# Patient Record
Sex: Female | Born: 1960 | Race: White | Hispanic: No | Marital: Single | Smoking: Former smoker
Health system: Southern US, Community
[De-identification: ages and names within clinical notes are randomized; demographics above are authoritative.]

## PROBLEM LIST (undated history)

## (undated) DIAGNOSIS — M797 Fibromyalgia: Secondary | ICD-10-CM

## (undated) DIAGNOSIS — F319 Bipolar disorder, unspecified: Secondary | ICD-10-CM

## (undated) DIAGNOSIS — Z950 Presence of cardiac pacemaker: Secondary | ICD-10-CM

## (undated) DIAGNOSIS — R945 Abnormal results of liver function studies: Secondary | ICD-10-CM

## (undated) DIAGNOSIS — R238 Other skin changes: Secondary | ICD-10-CM

## (undated) DIAGNOSIS — C7951 Secondary malignant neoplasm of bone: Secondary | ICD-10-CM

## (undated) DIAGNOSIS — G8929 Other chronic pain: Secondary | ICD-10-CM

## (undated) DIAGNOSIS — K219 Gastro-esophageal reflux disease without esophagitis: Secondary | ICD-10-CM

## (undated) DIAGNOSIS — C801 Malignant (primary) neoplasm, unspecified: Secondary | ICD-10-CM

## (undated) DIAGNOSIS — Z6841 Body Mass Index (BMI) 40.0 and over, adult: Secondary | ICD-10-CM

## (undated) DIAGNOSIS — R509 Fever, unspecified: Secondary | ICD-10-CM

## (undated) DIAGNOSIS — I10 Essential (primary) hypertension: Secondary | ICD-10-CM

## (undated) DIAGNOSIS — D649 Anemia, unspecified: Secondary | ICD-10-CM

## (undated) DIAGNOSIS — R519 Headache, unspecified: Secondary | ICD-10-CM

## (undated) DIAGNOSIS — R7989 Other specified abnormal findings of blood chemistry: Secondary | ICD-10-CM

## (undated) DIAGNOSIS — R6883 Chills (without fever): Secondary | ICD-10-CM

## (undated) DIAGNOSIS — R233 Spontaneous ecchymoses: Secondary | ICD-10-CM

## (undated) DIAGNOSIS — R531 Weakness: Secondary | ICD-10-CM

## (undated) DIAGNOSIS — M1990 Unspecified osteoarthritis, unspecified site: Secondary | ICD-10-CM

## (undated) DIAGNOSIS — C50919 Malignant neoplasm of unspecified site of unspecified female breast: Secondary | ICD-10-CM

## (undated) DIAGNOSIS — G4730 Sleep apnea, unspecified: Secondary | ICD-10-CM

## (undated) DIAGNOSIS — Z923 Personal history of irradiation: Secondary | ICD-10-CM

## (undated) DIAGNOSIS — R51 Headache: Secondary | ICD-10-CM

## (undated) DIAGNOSIS — I441 Atrioventricular block, second degree: Secondary | ICD-10-CM

## (undated) HISTORY — DX: Malignant neoplasm of unspecified site of unspecified female breast: C50.919

## (undated) HISTORY — DX: Weakness: R53.1

## (undated) HISTORY — DX: Fibromyalgia: M79.7

## (undated) HISTORY — PX: CHOLECYSTECTOMY: SHX55

## (undated) HISTORY — DX: Headache: R51

## (undated) HISTORY — PX: OTHER SURGICAL HISTORY: SHX169

## (undated) HISTORY — DX: Chills (without fever): R68.83

## (undated) HISTORY — DX: Other specified abnormal findings of blood chemistry: R79.89

## (undated) HISTORY — PX: ECTOPIC PREGNANCY SURGERY: SHX613

## (undated) HISTORY — DX: Bipolar disorder, unspecified: F31.9

## (undated) HISTORY — DX: Malignant (primary) neoplasm, unspecified: C80.1

## (undated) HISTORY — DX: Headache, unspecified: R51.9

## (undated) HISTORY — DX: Abnormal results of liver function studies: R94.5

## (undated) HISTORY — DX: Unspecified osteoarthritis, unspecified site: M19.90

## (undated) HISTORY — DX: Atrioventricular block, second degree: I44.1

## (undated) HISTORY — DX: Morbid (severe) obesity due to excess calories: E66.01

## (undated) HISTORY — DX: Body Mass Index (BMI) 40.0 and over, adult: Z684

## (undated) HISTORY — PX: BILATERAL KNEE ARTHROSCOPY: SUR91

## (undated) HISTORY — DX: Secondary malignant neoplasm of bone: C79.51

## (undated) HISTORY — DX: Fever, unspecified: R50.9

## (undated) HISTORY — DX: Other skin changes: R23.8

## (undated) HISTORY — DX: Other chronic pain: G89.29

## (undated) HISTORY — DX: Anemia, unspecified: D64.9

## (undated) HISTORY — DX: Personal history of irradiation: Z92.3

## (undated) HISTORY — DX: Spontaneous ecchymoses: R23.3

## (undated) HISTORY — PX: PORTACATH PLACEMENT: SHX2246

---

## 2001-10-07 ENCOUNTER — Encounter (HOSPITAL_COMMUNITY): Admission: RE | Admit: 2001-10-07 | Discharge: 2001-11-06 | Payer: Self-pay | Admitting: Rheumatology

## 2009-02-10 HISTORY — PX: PACEMAKER INSERTION: SHX728

## 2009-07-24 ENCOUNTER — Ambulatory Visit: Payer: Self-pay | Admitting: Cardiology

## 2009-10-25 ENCOUNTER — Encounter: Payer: Self-pay | Admitting: Physician Assistant

## 2009-10-26 ENCOUNTER — Ambulatory Visit: Payer: Self-pay | Admitting: Cardiology

## 2009-10-26 ENCOUNTER — Encounter: Payer: Self-pay | Admitting: Physician Assistant

## 2009-10-27 ENCOUNTER — Encounter: Payer: Self-pay | Admitting: Physician Assistant

## 2009-11-02 ENCOUNTER — Ambulatory Visit: Payer: Self-pay | Admitting: Cardiology

## 2009-11-02 ENCOUNTER — Encounter (INDEPENDENT_AMBULATORY_CARE_PROVIDER_SITE_OTHER): Payer: Self-pay | Admitting: *Deleted

## 2009-11-02 DIAGNOSIS — R079 Chest pain, unspecified: Secondary | ICD-10-CM | POA: Insufficient documentation

## 2009-11-02 DIAGNOSIS — R55 Syncope and collapse: Secondary | ICD-10-CM | POA: Insufficient documentation

## 2009-11-04 ENCOUNTER — Encounter: Payer: Self-pay | Admitting: Physician Assistant

## 2009-11-08 ENCOUNTER — Encounter: Payer: Self-pay | Admitting: Physician Assistant

## 2009-11-09 ENCOUNTER — Encounter: Payer: Self-pay | Admitting: Internal Medicine

## 2009-11-09 ENCOUNTER — Encounter (INDEPENDENT_AMBULATORY_CARE_PROVIDER_SITE_OTHER): Payer: Self-pay | Admitting: *Deleted

## 2009-11-09 ENCOUNTER — Ambulatory Visit: Payer: Self-pay | Admitting: Physician Assistant

## 2009-11-09 DIAGNOSIS — I441 Atrioventricular block, second degree: Secondary | ICD-10-CM | POA: Insufficient documentation

## 2009-11-13 ENCOUNTER — Ambulatory Visit: Payer: Self-pay | Admitting: Internal Medicine

## 2009-11-13 ENCOUNTER — Ambulatory Visit (HOSPITAL_COMMUNITY): Admission: RE | Admit: 2009-11-13 | Discharge: 2009-11-14 | Payer: Self-pay | Admitting: Internal Medicine

## 2009-11-14 DIAGNOSIS — I441 Atrioventricular block, second degree: Secondary | ICD-10-CM

## 2009-11-14 HISTORY — PX: OTHER SURGICAL HISTORY: SHX169

## 2009-11-14 HISTORY — DX: Atrioventricular block, second degree: I44.1

## 2009-11-19 ENCOUNTER — Encounter: Payer: Self-pay | Admitting: Internal Medicine

## 2009-11-26 ENCOUNTER — Ambulatory Visit: Payer: Self-pay

## 2010-03-04 ENCOUNTER — Encounter: Payer: Self-pay | Admitting: Internal Medicine

## 2010-03-04 ENCOUNTER — Ambulatory Visit
Admission: RE | Admit: 2010-03-04 | Discharge: 2010-03-04 | Payer: Self-pay | Source: Home / Self Care | Attending: Internal Medicine | Admitting: Internal Medicine

## 2010-03-12 NOTE — Assessment & Plan Note (Signed)
Summary: [facility]-POST [facility]   Visit Type:  Follow-up Primary Provider:  Farrell Ours   History of Present Illness: patient presents for initial post hospital followup.   She was initially referred to [place] this past June, here at [facility], for evaluation of syncope and marginally elevated troponins. She was felt to have vasovagal syncope, had a normal baseline 2D echocardiogram, and was referred for a dobutamine stress echocardiogram, which was normal.  She was then readmitted a few weeks ago, again with syncope (her second episode), and kept for overnight observation. We were not formally consulted, this time. An echocardiogram was repeated, and again normal. She was referred for a 48 hour Holter monitor, with results made available to [place] just yesterday.  Monitor results notable for several episodes of marked bradycardia, with heart rate as low as 26 bpm, and 3 pauses of greater than 2.5 second duration, with the longest 4.5 seconds.  Clinically, she denied any frank syncope during this timeframe; however, she continues to have intermittent dizziness.  Preventive Screening-Counseling & Management  Alcohol-Tobacco     Smoking Status: quit     Year Quit: 1997  Current Medications (verified): 1)  Abilify 20 Mg Tabs (Aripiprazole) .... Take 1 Tablet By Mouth Once A Day 2)  Meloxicam 7.5 Mg Tabs (Meloxicam) .... Take 1 Tablet By Mouth Once A Day 3)  Ambien 10 Mg Tabs (Zolpidem Tartrate) .... Take 1 Tablet By Mouth Once A Day As Needed 4)  Lortab 5-500 Mg Tabs (Hydrocodone-Acetaminophen) .... Take 1 Tablet By Mouth Three Times A Day 5)  Omeprazole 20 Mg Cpdr (Omeprazole) .... Take 1 Tablet By Mouth Once A Day 6)  Cymbalta 60 Mg Cpep (Duloxetine Hcl) .... Take 1 Tablet By Mouth Once A Day 7)  Flexeril 5 Mg Tabs (Cyclobenzaprine Hcl) .... Take 1 Tablet By Mouth Once A Day 8)  Klonopin 1 Mg Tabs (Clonazepam) .... Take 1 Tablet By Mouth Once A Day  Allergies: 1)  ! Pcn 2)  !  Sulfa  Comments:  Nurse/Medical Assistant: The patient's medications and allergies were verbally reviewed with the patient and were updated in the Medication and Allergy Lists.  Past History:  Past Medical History: Bipolar disorder Fibromyalgia Anemia History of elevated LFTs  Review of Systems       No fevers, chills, hemoptysis, dysphagia, melena, hematocheezia, hematuria, rash, claudication, or orthopnea. Patient has mild, chronic pedal edema. All other systems negative.   Vital Signs:  Patient profile:   50 year old female Height:      64 inches Weight:      241 pounds BMI:     41.52 Pulse rate:   88 / minute BP sitting:   113 / 80  (left arm) Cuff size:   large  Vitals Entered By: Carlye Grippe (November 09, 2009 12:52 PM)  Nutrition Counseling: Patient's BMI is greater than 25 and therefore counseled on weight management options.  Physical Exam  Additional Exam:  GEN: 50 year old female, sitting upright, no distress HEENT: NCAT,PERRLA,EOMI NECK: palpable pulses, no bruits; no JVD; no TM LUNGS: CTA bilaterally HEART: RRR (S1S2); no significant murmurs; no rubs; no gallops ABD: soft, NT; intact BS EXT: intact distal pulses; 1+ pedal edema SKIN: warm, dry MUSC: no obvious deformity NEURO: A/O (x3)     EKG  Procedure date:  10/25/2009  Findings:      normal sinus rhythm with PACs at 96 bpm; nonspecific ST changes  Impression & Recommendations:  Problem # 1:  BRADYCARDIA (ICD-427.89)  recent 48-hour Holter monitor reveals episodes of marked bradycardia and ventricular asystole, with rates as low as 26 BPM, and pauses up to 4.5 seconds in duration. the rhythm appears to be high-grade atrioventricular block, with no definite evidence of complete heart block. the patient did not experience any recurrent syncope during this timeframe. however, she continues to have intermittent dizziness. will refer patient to our EP team for consideration of possible  permanent pacemaker implantation. in the meanwhile, patient is advised to refrain from driving. Of note, she is not on any SA/AV nodal blocking agents.  Problem # 2:  SYNCOPE (ICD-780.2) Assessment: Comment Only  patient has NL LVF by recent echocardiography.  Problem # 3:  CHEST PAIN (ICD-786.50)  patient had recent normal dobutamine stress echocardiogram. No further workup is indicated.  Patient Instructions: 1)  Your physician recommends that you go to the [facility] for lab work: DO TODAY. 2)  Your physician has recommended that you have a pacemaker inserted.  A pacemaker is a small device that is placed under the skin of your chest or abdomen to help control abnormal heart rhythms. This device uses electrical pulses to prompt the heart to beat at a normal rate. Pacemakers are used to treat heart rhythms that are too slow. Wires (leads) are attached to the pacemaker that goes into the chambers of your heart. This is done in the hospital and usually requires an overnight stay. Please see the instruction sheet given to you today for more information.  Appended Document: [facility] I spoke briefly with patient and reviewed her event monitor which clearly documents mobitz II AV block with multiple consecutive P waves not conducted.  She is at risk for progressive AV block and syncope.  I have recommended pacemaker implantation and the patient wishes to proceed.  We will schedule pacemaker implantation at the next available time.  Given her h/o requiring MRIs, I have suggested a REVO (Medtronic) MRI compatable pacemaker.

## 2010-03-12 NOTE — Procedures (Signed)
Summary: wound check.mdt.amber   Current Medications (verified): 1)  Abilify 20 Mg Tabs (Aripiprazole) .... Take 1 Tablet By Mouth Once A Day 2)  Meloxicam 7.5 Mg Tabs (Meloxicam) .... Take 1 Tablet By Mouth Once A Day 3)  Ambien 10 Mg Tabs (Zolpidem Tartrate) .... Take 1 Tablet By Mouth Once A Day As Needed 4)  Lortab 5-500 Mg Tabs (Hydrocodone-Acetaminophen) .... Take 1 Tablet By Mouth Three Times A Day 5)  Omeprazole 20 Mg Cpdr (Omeprazole) .... Take 1 Tablet By Mouth Once A Day 6)  Cymbalta 60 Mg Cpep (Duloxetine Hcl) .... Take 1 Tablet By Mouth Once A Day 7)  Flexeril 5 Mg Tabs (Cyclobenzaprine Hcl) .... Take 1 Tablet By Mouth Once A Day 8)  Klonopin 1 Mg Tabs (Clonazepam) .... Take 1 Tablet By Mouth Once A Day  Allergies (verified): 1)  ! Pcn 2)  ! Sulfa   PPM Specifications Following MD:  Hillis Range, MD     PPM Vendor:  Medtronic     PPM Model Number:  [id number]     PPM Serial Number:  [id number] PPM DOI:  11/14/2009     PPM Implanting MD:  Hillis Range, MD  Lead 1    Location: RA     DOI: 11/14/2009     Model #: [id number]     Serial #: [id number]     Status: active Lead 2    Location: RV     DOI: 11/14/2009     Model #: [id number]     Serial #: [id number]      Magnet Response Rate:  BOL 85 ERI 65  Indications:  Mobitz II   PPM Follow Up Battery Voltage:  3.04 V       PPM Device Measurements Atrium  Amplitude: 1.0 mV, Impedance: 504 ohms, Threshold: 0.5 V at 0.4 msec Right Ventricle  Amplitude: 4.6 mV, Impedance: 496 ohms, Threshold: 1.0 V at 0.4 msec  Episodes MS Episodes:  0     Ventricular High Rate:  0     Atrial Pacing:  0.2%     Ventricular Pacing:  0.9%  Parameters Mode:  DDD     Lower Rate Limit:  60     Upper Rate Limit:  130 Paced AV Delay:  300     Sensed AV Delay:  300 Next Cardiology Appt Due:  03/04/2010 Tech Comments:  WOUND CHECK--STERI STRIPS REMOVED.  NO REDNESS OR SWELLING AT SITE.  NORMAL DEVICE FUNCTION.  NO CHANGES MADE.  PT HAVING SOME SOB BUT HAD BEFORE  IMPLANT AS WELL.  ROV 03-04-09 W/JA. Vella Kohler  November 26, 2009 11:43 AM

## 2010-03-12 NOTE — Letter (Signed)
Summary: [facility] D/C DR. Letta Median  [facility] D/C DR. Letta Median   Imported By: Zachary George 10/30/2009 11:01:34  _____________________________________________________________________  External Attachment:    Type:   Image     Comment:   External Document

## 2010-03-12 NOTE — Assessment & Plan Note (Signed)
Summary: 48 hour holter monitor  Nurse Visit  CC: 48 holter Comments monitor place without difficulty and patient verbalized understanding of instructions.   Allergies: 1)  ! Pcn  Orders Added: 1)  Holter Monitor [Holter Monitor]

## 2010-03-12 NOTE — Miscellaneous (Signed)
Summary: Device preload  Clinical Lists Changes  Observations: Added new observation of PPM INDICATN: Mobitz II (11/19/2009 15:27) Added new observation of MAGNET RTE: BOL 85 ERI 65 (11/19/2009 15:27) Added new observation of PPMLEADSER2: [id number] (11/19/2009 15:27) Added new observation of PPMLEADMOD2: 5086  (11/19/2009 15:27) Added new observation of PPMLEADLOC2: RV  (11/19/2009 15:27) Added new observation of PPMLEADSTAT1: active  (11/19/2009 15:27) Added new observation of PPMLEADSER1: [id number]  (11/19/2009 15:27) Added new observation of PPMLEADMOD1: 5086  (11/19/2009 15:27) Added new observation of PPMLEADLOC1: RA  (11/19/2009 15:27) Added new observation of PPM IMP MD: Hillis Range, MD  (11/19/2009 15:27) Added new observation of PPMLEADDOI2: 11/14/2009  (11/19/2009 15:27) Added new observation of PPMLEADDOI1: 11/14/2009  (11/19/2009 15:27) Added new observation of PPM DOI: 11/14/2009  (11/19/2009 15:27) Added new observation of PPM SERL#: [id number]  (11/19/2009 15:27) Added new observation of PPM MODL#: [id number]  (11/19/2009 40:34) Added new observation of PACEMAKERMFG: Medtronic  (11/19/2009 15:27) Added new observation of PACEMAKER MD: Hillis Range, MD  (11/19/2009 15:27)      PPM Specifications Following MD:  Hillis Range, MD     PPM Vendor:  Medtronic     PPM Model Number:  [id number]     PPM Serial Number:  [id number] PPM DOI:  11/14/2009     PPM Implanting MD:  Hillis Range, MD  Lead 1    Location: RA     DOI: 11/14/2009     Model #: [id number]     Serial #: [id number]     Status: active Lead 2    Location: RV     DOI: 11/14/2009     Model #: [id number]     Serial #: [id number]      Magnet Response Rate:  BOL 85 ERI 65  Indications:  Mobitz II

## 2010-03-12 NOTE — Procedures (Signed)
Summary: Holter Full Report  Holter Full Report   Imported By: Cyril Loosen, RN, BSN 11/09/2009 14:59:53  _____________________________________________________________________  External Attachment:    Type:   Image     Comment:   External Document

## 2010-03-12 NOTE — Letter (Signed)
Summary: Implantable Device Instructions  Architectural technologist at [facility] [address]   [place], [place] [address]   Phone: [phone]  Fax: [phone]      Implantable Device Instructions  You are scheduled for:  ___X__ Permanent Transvenous Pacemaker _____ Implantable Cardioverter Defibrillator _____ Implantable Loop Recorder _____ Generator Change  on __Tuesday, October 4, 2011___ with Dr. __Allred___.  1.  Please arrive at the Short Stay Center at [facility] at __10 am___ on the day of your procedure.  2.  Do not eat or drink the night before your procedure.  3.  Complete lab work on _____.  The lab at [facility] is open from 8:30 AM to 1:30 PM and from 2:30 PM to 5:00 PM.  The lab at [facility] is open from 7:30 AM to 5:30 PM.  You do not have to be fasting.  4.  Do NOT take these medications for ____ days prior to your procedure:  _________________________.  Take your last dose of Coumadin on ________.  5.  Plan for an overnight stay.  Bring your insurance cards and a list of your medications.  6.  Wash your chest and neck with antibacterial soap (any brand) the evening before and the morning of your procedure.  Rinse well.  7.  Education material received:     Pacemaker _____           ICD _____           Arrhythmia _____  *If you have ANY questions after you get home, please call the office [phone].  *Every attempt is made to prevent procedures from being rescheduled.  Due to the nauture of Electrophysiology, rescheduling can happen.  The physician is always aware and directs the staff when this occurs.

## 2010-03-12 NOTE — Cardiovascular Report (Signed)
Summary: Pre Op Orders   Pre Op Orders   Imported By: Roderic Ovens 11/21/2009 16:02:02  _____________________________________________________________________  External Attachment:    Type:   Image     Comment:   External Document

## 2010-03-14 NOTE — Assessment & Plan Note (Signed)
Summary: pacer check.mdt.amber   Visit Type:  Pacemaker check Primary Provider:  Farrell Ours   History of Present Illness: The patient presents today for routine electrophysiology followup. She reports doing very well since having her pacemaker implanted 11/14/09.  She has had no further syncope since that time.  She reports occaisonal pain in her L shoulder but feels that this is improving.  The patient denies symptoms of palpitations, chest pain, shortness of breath, orthopnea, PND, lower extremity edema, dizziness, presyncope, syncope, or neurologic sequela. The patient is tolerating medications without difficulties and is otherwise without complaint today.   Preventive Screening-Counseling & Management  Alcohol-Tobacco     Smoking Status: quit     Year Quit: 1987  Current Medications (verified): 1)  Abilify 20 Mg Tabs (Aripiprazole) .... Take 1 Tablet By Mouth Once A Day 2)  Meloxicam 7.5 Mg Tabs (Meloxicam) .... Take 1 Tablet By Mouth Once A Day 3)  Ambien 10 Mg Tabs (Zolpidem Tartrate) .... Take 1 Tablet By Mouth Once A Day As Needed 4)  Lortab 5-500 Mg Tabs (Hydrocodone-Acetaminophen) .... Take 1 Tablet By Mouth Three Times A Day 5)  Omeprazole 20 Mg Cpdr (Omeprazole) .... Take 1 Tablet By Mouth Once A Day 6)  Cymbalta 60 Mg Cpep (Duloxetine Hcl) .... Take 1 Tablet By Mouth Once A Day 7)  Flexeril 5 Mg Tabs (Cyclobenzaprine Hcl) .... Take 1 Tablet By Mouth Once A Day 8)  Klonopin 1 Mg Tabs (Clonazepam) .... Take 1 Tablet By Mouth Once A Day  Allergies (verified): 1)  ! Pcn 2)  ! Sulfa  Comments:  Nurse/Medical Assistant: The patient's medications and allergies were reviewed with the patient and were updated in the Medication and Allergy Lists. Tammi Romine CMA (March 04, 2010 3:38 PM)  Past History:  Past Medical History: Bipolar disorder Fibromyalgia Anemia History of elevated LFTs Mobitz II AV block s/p MDT Revo PPM 11/14/09 by Fawn Kirk  Past Surgical  History: Cholecystectomy 2 Ectopic pregnancies Bilateral knee arthroscopy Gastric Bypass surgery Plastic surgery on face for ptosis of rt. eyelid Left ovary and fallopian tube removed due to chronic pain PPM 11/14/09  Vital Signs:  Patient profile:   50 year old female Height:      64 inches Weight:      259 pounds Pulse rate:   83 / minute BP sitting:   122 / 83  (left arm) Cuff size:   large  Vitals Entered By: Fuller Plan CMA (March 04, 2010 3:36 PM)  Physical Exam  General:  overweight Head:  normocephalic and atraumatic Eyes:  PERRLA/EOM intact; conjunctiva and lids normal. Mouth:  Teeth, gums and palate normal. Oral mucosa normal. Neck:  supple Chest Wall:  pacemaker pocket is well healed Lungs:  Clear bilaterally to auscultation and percussion. Heart:  Non-displaced PMI, chest non-tender; regular rate and rhythm, S1, S2 without murmurs, rubs or gallops. Carotid upstroke normal, no bruit. Normal abdominal aortic size, no bruits. Femorals normal pulses, no bruits. Pedals normal pulses. No edema, no varicosities. Abdomen:  Bowel sounds positive; abdomen soft and non-tender without masses, organomegaly, or hernias noted. No hepatosplenomegaly. Msk:  Back normal, normal gait. Muscle strength and tone normal. Extremities:  No clubbing or cyanosis. Neurologic:  Alert and oriented x 3.   PPM Specifications Following MD:  Hillis Range, MD     PPM Vendor:  Medtronic     PPM Model Number:  [facility]     PPM Serial Number:  [id number] PPM DOI:  11/14/2009  PPM Implanting MD:  Hillis Range, MD  Lead 1    Location: RA     DOI: 11/14/2009     Model #: 1610     Serial #: [id number]     Status: active Lead 2    Location: RV     DOI: 11/14/2009     Model #: 1191     Serial #: [id number]      Magnet Response Rate:  BOL 85 ERI 65  Indications:  Mobitz II   PPM Follow Up Battery Voltage:  3.03 V       PPM Device Measurements Atrium  Amplitude: 1.6 mV, Impedance: 456 ohms,  Threshold: 0.5 V at 0.4 msec Right Ventricle  Amplitude: 4.9 mV, Impedance: 544 ohms, Threshold: 1.0 V at 0.4 msec  Episodes MS Episodes:  0     Ventricular High Rate:  0     Atrial Pacing:  <0.2%     Ventricular Pacing:  <0.2%  Parameters Mode:  DDD     Lower Rate Limit:  60     Upper Rate Limit:  130 Paced AV Delay:  300     Sensed AV Delay:  300 Next Cardiology Appt Due:  08/12/2010 Tech Comments:  1 FAST A&V EPISODE.  NORMAL DEVICE FUNCTION. NO CHANGES MADE. ROV IN 6 MTHS W/DEVICE CLINIC IN [place]. Vella Kohler  March 04, 2010 3:47 PM MD Comments:  agree above fast A&V appears to be sinus tach  Impression & Recommendations:  Problem # 1:  SYNCOPE (ICD-780.2) due to Mobitz II second degree AV block resolved s/p PPM doing well as above

## 2010-03-20 NOTE — Cardiovascular Report (Signed)
Summary: Card Device Clinic/ INTERROGATION QUICK LOOK  Card Device Clinic/ INTERROGATION QUICK LOOK   Imported By: Dorise Hiss 03/14/2010 16:57:57  _____________________________________________________________________  External Attachment:    Type:   Image     Comment:   External Document

## 2010-04-25 LAB — SURGICAL PCR SCREEN: Staphylococcus aureus: NEGATIVE

## 2010-06-28 NOTE — Consult Note (Signed)
Ashlee Moore, Ashlee Moore NO.:  [id number]   MEDICAL RECORD NO.:  [id number]                   PATIENT TYPE:   LOCATION:                                       FACILITY:   PHYSICIAN:  Aundra Dubin, M.D.            DATE OF BIRTH:   DATE OF CONSULTATION:  DATE OF DISCHARGE:                                   CONSULTATION   CHIEF COMPLAINT:  Left knee, left foot.   REASON FOR CONSULTATION:  This is the first office visit since last seeing  Ashlee Moore on May 07, 2000. At that time, I injected her right knee  because of pain. This did not particularly help or sustain. Ashlee Moore underwent an  MRI at [facility]. This showed the medial meniscus was somewhat thin  along with diffuse articular cartilage loss and fissuring involving all  three joint compartments. The plain x-rays also evaluated at that time  showed tri-compartment advanced OA changes. For unclear reasons, Ashlee Moore was not  evaluated by [name]. Her problem today is much more the left knee.  Several months ago, the knee began to pop. Ashlee Moore brings with her an x-ray  report from June 2003 showing significant degenerative arthritis. Ashlee Moore hurts  going up and down steps. Ashlee Moore is very stiff in the knee. Ashlee Moore reports falling  several times. Ashlee Moore is using a cane. Another problem is her left foot with a  burning type of pain medially and on the dorsal surface. There has been no  swelling to the left foot. Her leg has increased by 12 pounds. There has  been no recent urinary tract infection or shortness of breath Ashlee Moore recalls.  Her hands and wrists ache but not severely. Ashlee Moore continues to have low  energy. Sleep is not very good, even with Ambien.   MEDICATIONS:  1. Ambien 10 mg HS.  2. Zoloft 100 mg daily.  3. Mobic 7.5 mg bid.  4. Darvocet as needed.  5. Calcium bid.   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   PHYSICAL  EXAMINATION:  VITAL SIGNS: Weight 261 pounds. Blood pressure  130/80. Respiratory rate 20.  GENERAL: Ashlee Moore is significantly overweight.  LUNGS: Clear.  HEART: Regular rate and rhythm. No murmur, rub, or gallop.  SKIN: Clear.  MS: Hands are puffy but not arthritically swollen. Wrist, elbows, and  shoulders move with some stiffness. Trigger points of the elbows, shoulders,  neck, anterior chest, and along the paraspinous muscles was tender. The  right knee is cool but does have tenderness with flexion beyond 120 degrees.  The left knee had slight warmth. There was no effusion. Ashlee Moore had significant  tenderness with flexion beyond 120 degrees. Ashlee Moore also had significant medial  joint line tenderness. Ankles had trace edema. Ashlee Moore was tender to the medial  foot, along the navicular bone with  capping below the malleolus which may  have shown a positive Tinel sign. Her gait is antalgic and Ashlee Moore favors the  left extremity.    ASSESSMENT/PLAN:  1. Knee osteoarthritis. This is quite evident from the x-rays but there may     be further damage to possibly the meniscus. I offered to inject the knee     but Ashlee Moore says that these injections do not last very long. Ashlee Moore most likely     will need an MRI but I will leave this to an orthopedist evaluation. We     are setting up an evaluation with [name]. We will switch her     NSAID to Relafen 750 mg two pills daily. Ashlee Moore says that the Darvocet gives     her nausea.  2. Left foot pain. I have not x-rayed the foot. This may be a tarsal     syndrome. Ashlee Moore could also have degenerative changes to the mid foot.  3. History of insomnia and polyarthralgia. Ashlee Moore is on Ambien.   As we are having her evaluated by [name], I will be glad to work  with her in the future and see her on a as needed basis.                                               Aundra Dubin, M.D.    WWT/MEDQ  D:  10/07/2001  T:  10/08/2001  Job:  [id number]   cc:   Temple Pacini Plomaritis

## 2010-08-05 ENCOUNTER — Telehealth: Payer: Self-pay | Admitting: Internal Medicine

## 2010-08-05 NOTE — Telephone Encounter (Signed)
Spoke with pt and let her know that we would be glad to help but that a form would need to be filled out and her device would need to be checked before and after the MRI  Pt understands

## 2010-08-05 NOTE — Telephone Encounter (Signed)
Pt was to have an mri @ [facility] this am they can't do mri on a pt with a  pacemaker, which hospital  will do it? Wants to rs due to the mass in her leg

## 2010-08-06 ENCOUNTER — Other Ambulatory Visit (HOSPITAL_COMMUNITY): Payer: Self-pay | Admitting: Orthopedic Surgery

## 2010-08-06 DIAGNOSIS — M79659 Pain in unspecified thigh: Secondary | ICD-10-CM

## 2010-08-08 ENCOUNTER — Ambulatory Visit (HOSPITAL_COMMUNITY)
Admission: RE | Admit: 2010-08-08 | Discharge: 2010-08-08 | Disposition: A | Discharge status: Home / Self Care | Payer: Medicaid Other | Source: Ambulatory Visit | Attending: Orthopedic Surgery | Admitting: Orthopedic Surgery

## 2010-08-08 ENCOUNTER — Other Ambulatory Visit (HOSPITAL_COMMUNITY): Payer: Self-pay | Admitting: Orthopedic Surgery

## 2010-08-08 DIAGNOSIS — M79659 Pain in unspecified thigh: Secondary | ICD-10-CM

## 2010-08-08 DIAGNOSIS — R229 Localized swelling, mass and lump, unspecified: Secondary | ICD-10-CM | POA: Insufficient documentation

## 2010-08-16 ENCOUNTER — Encounter: Payer: Self-pay | Admitting: Internal Medicine

## 2010-09-08 ENCOUNTER — Other Ambulatory Visit: Payer: Self-pay | Admitting: Medical

## 2010-10-08 ENCOUNTER — Encounter: Payer: Self-pay | Admitting: *Deleted

## 2010-11-07 ENCOUNTER — Encounter: Payer: Self-pay | Admitting: Internal Medicine

## 2010-11-07 ENCOUNTER — Ambulatory Visit (INDEPENDENT_AMBULATORY_CARE_PROVIDER_SITE_OTHER): Payer: Medicaid Other | Admitting: Internal Medicine

## 2010-11-07 DIAGNOSIS — I441 Atrioventricular block, second degree: Secondary | ICD-10-CM

## 2010-11-07 DIAGNOSIS — I498 Other specified cardiac arrhythmias: Secondary | ICD-10-CM

## 2010-11-07 LAB — PACEMAKER DEVICE OBSERVATION
AL AMPLITUDE: 1.7741 mv
ATRIAL PACING PM: 0.05
BAMS-0001: 170 {beats}/min
BATTERY VOLTAGE: 3 V
RV LEAD THRESHOLD: 1 V
VENTRICULAR PACING PM: 0.08

## 2010-11-07 NOTE — Progress Notes (Signed)
The patient presents today for routine electrophysiology followup.  Since last being seen in our clinic, the patient reports doing very well.  She has had no further syncope.  She reports rare palpitations.  Today, she denies symptoms ofchest pain, shortness of breath, orthopnea, PND, lower extremity edema, dizziness, or neurologic sequela.  The patient feels that she is tolerating medications without difficulties and is otherwise without complaint today.   Past Medical History  Diagnosis Date  . Bipolar disorder   . Fibromyalgia   . Anemia   . Elevated LFTs     hx  . AV block, Mobitz 2 11/14/09    s/p MDT Revo PPM by JA   Past Surgical History  Procedure Date  . Cholecystectomy   . Ectopic pregnancy surgery     x2  . Bilateral knee arthroscopy   . Gastric bypass surgery   . Plastic surgery on face     for ptosis of rt. eyelid  . Left ovary and fallopian tube removed due to chronic pain   . Ppm 11/14/09    MDT Revo implanted by Dr Johney Frame for syncope/ Mobitz II AV block    Current Outpatient Prescriptions  Medication Sig Dispense Refill  . ARIPiprazole (ABILIFY) 20 MG tablet Take 20 mg by mouth daily.        . clonazePAM (KLONOPIN) 1 MG tablet Take 1 mg by mouth daily.        . cyclobenzaprine (FLEXERIL) 5 MG tablet Take 5 mg by mouth daily.        . DULoxetine (CYMBALTA) 60 MG capsule Take 60 mg by mouth daily.        Marland Kitchen HYDROcodone-acetaminophen (LORTAB 5) 5-500 MG per tablet Take 1 tablet by mouth 3 (three) times daily.        . meloxicam (MOBIC) 7.5 MG tablet Take 7.5 mg by mouth daily.        Marland Kitchen omeprazole (PRILOSEC) 20 MG capsule Take 20 mg by mouth daily.        Marland Kitchen zolpidem (AMBIEN) 10 MG tablet Take 10 mg by mouth daily as needed.          Allergies  Allergen Reactions  . Penicillins     REACTION: throat swelling  . Sulfonamide Derivatives     REACTION: rash    History   Social History  . Marital Status: Single    Spouse Name: N/A    Number of Children: N/A  .  Years of Education: N/A   Occupational History  . Not on file.   Social History Main Topics  . Smoking status: Former Smoker -- 0.3 packs/day for 8 years    Types: Cigarettes    Quit date: 02/10/1985  . Smokeless tobacco: Never Used   Comment: Quit 25 years back.   . Alcohol Use: No  . Drug Use: No  . Sexually Active: Not on file   Other Topics Concern  . Not on file   Social History Narrative   Lives with her children. Disabled.     Family History  Problem Relation Age of Onset  . Heart attack Father   . Breast cancer Sister   . Brain cancer Sister     Physical Exam: Filed Vitals:   11/07/10 1051  BP: 113/71  Pulse: 96  Height: 5\' 5"  (1.651 m)  Weight: 275 lb (124.739 kg)    GEN- The patient is obese appearing, alert and oriented x 3 today.   Head- normocephalic, atraumatic Eyes-  Sclera  clear, conjunctiva pink Ears- hearing intact Oropharynx- clear Neck- supple, no JVP Lymph- no cervical lymphadenopathy Lungs- Clear to ausculation bilaterally, normal work of breathing Chest- pacemaker pocket is well healed Heart- Regular rate and rhythm, no murmurs, rubs or gallops, PMI not laterally displaced GI- soft, NT, ND, + BS Extremities- no clubbing, cyanosis, trace edema  Pacemaker interrogation- reviewed in detail today,  See PACEART report  Assessment and Plan:

## 2010-11-07 NOTE — Assessment & Plan Note (Signed)
Normal pacemaker function See Arita Miss Art report No changes today  Syncope resolved,  She paces <1%

## 2011-01-11 HISTORY — PX: BREAST SURGERY: SHX581

## 2011-02-11 HISTORY — PX: OTHER SURGICAL HISTORY: SHX169

## 2011-02-18 ENCOUNTER — Telehealth: Payer: Self-pay | Admitting: Internal Medicine

## 2011-02-18 NOTE — Telephone Encounter (Signed)
To what area is she receiving radiation? Have Gunnar Fusi fill out the order sheet for radiation in patients with a device.

## 2011-02-18 NOTE — Telephone Encounter (Signed)
Follow up from previous.  Patient calling regarding chemotherapy.

## 2011-02-18 NOTE — Telephone Encounter (Signed)
New problem Pt said she will starting chemotherapy in couple of weeks. She wanted to talk to you

## 2011-02-18 NOTE — Telephone Encounter (Signed)
Patient to start Chemo(x 6 weeks)  on 02/27/11 with radiation to follow.   Pacemaker is on the left side  [facility] in [place]  Any contraindications?

## 2011-02-19 NOTE — Telephone Encounter (Signed)
She is having radiation to right breast and her device is on the left side

## 2011-02-19 NOTE — Telephone Encounter (Signed)
Gunnar Fusi,  Please fill out the radiation form for this patient.

## 2011-02-19 NOTE — Telephone Encounter (Signed)
We will wait for MD to send over radiation form, Dr. Johney Frame aware.

## 2011-03-10 DIAGNOSIS — I517 Cardiomegaly: Secondary | ICD-10-CM

## 2011-05-02 ENCOUNTER — Encounter: Payer: Self-pay | Admitting: Internal Medicine

## 2011-05-02 ENCOUNTER — Ambulatory Visit (INDEPENDENT_AMBULATORY_CARE_PROVIDER_SITE_OTHER): Payer: Medicaid Other | Admitting: *Deleted

## 2011-05-02 DIAGNOSIS — I441 Atrioventricular block, second degree: Secondary | ICD-10-CM

## 2011-05-02 LAB — PACEMAKER DEVICE OBSERVATION
AL AMPLITUDE: 1.7 mv
AL THRESHOLD: 1 V
BAMS-0001: 170 {beats}/min
BATTERY VOLTAGE: 3 V
RV LEAD AMPLITUDE: 6.4 mv
RV LEAD THRESHOLD: 1 V

## 2011-05-02 NOTE — Progress Notes (Signed)
Pacer check in clinic  

## 2011-05-20 ENCOUNTER — Telehealth: Payer: Self-pay | Admitting: Internal Medicine

## 2011-05-20 NOTE — Telephone Encounter (Signed)
All Cardiac faxed to [facility] Per Allred  Faxed to [phone] 05/20/11/KM

## 2011-06-24 ENCOUNTER — Encounter: Payer: Medicare Other | Admitting: Internal Medicine

## 2011-06-24 DIAGNOSIS — R110 Nausea: Secondary | ICD-10-CM

## 2011-06-24 DIAGNOSIS — C50919 Malignant neoplasm of unspecified site of unspecified female breast: Secondary | ICD-10-CM

## 2011-06-24 DIAGNOSIS — E860 Dehydration: Secondary | ICD-10-CM

## 2011-06-25 ENCOUNTER — Encounter: Payer: Medicare Other | Admitting: Hematology and Oncology

## 2011-06-25 DIAGNOSIS — R110 Nausea: Secondary | ICD-10-CM

## 2011-06-25 DIAGNOSIS — C50919 Malignant neoplasm of unspecified site of unspecified female breast: Secondary | ICD-10-CM

## 2011-06-26 ENCOUNTER — Encounter: Payer: Medicare Other | Admitting: Internal Medicine

## 2011-06-27 ENCOUNTER — Encounter: Payer: Medicare Other | Admitting: Internal Medicine

## 2011-06-27 DIAGNOSIS — C50919 Malignant neoplasm of unspecified site of unspecified female breast: Secondary | ICD-10-CM

## 2011-07-02 ENCOUNTER — Encounter: Payer: Medicare Other | Admitting: Internal Medicine

## 2011-07-02 DIAGNOSIS — C50919 Malignant neoplasm of unspecified site of unspecified female breast: Secondary | ICD-10-CM

## 2011-07-02 DIAGNOSIS — E860 Dehydration: Secondary | ICD-10-CM

## 2011-07-08 ENCOUNTER — Encounter: Payer: Medicare Other | Admitting: Internal Medicine

## 2011-07-08 DIAGNOSIS — Z5111 Encounter for antineoplastic chemotherapy: Secondary | ICD-10-CM

## 2011-07-08 DIAGNOSIS — C50919 Malignant neoplasm of unspecified site of unspecified female breast: Secondary | ICD-10-CM

## 2011-07-08 DIAGNOSIS — Z170 Estrogen receptor positive status [ER+]: Secondary | ICD-10-CM

## 2011-07-09 ENCOUNTER — Encounter: Payer: Medicare Other | Admitting: Internal Medicine

## 2011-07-09 DIAGNOSIS — C50919 Malignant neoplasm of unspecified site of unspecified female breast: Secondary | ICD-10-CM

## 2011-07-10 ENCOUNTER — Encounter: Payer: Medicare Other | Admitting: Internal Medicine

## 2011-07-10 DIAGNOSIS — C50919 Malignant neoplasm of unspecified site of unspecified female breast: Secondary | ICD-10-CM

## 2011-07-10 DIAGNOSIS — F411 Generalized anxiety disorder: Secondary | ICD-10-CM

## 2011-07-10 DIAGNOSIS — R112 Nausea with vomiting, unspecified: Secondary | ICD-10-CM

## 2011-07-14 ENCOUNTER — Encounter: Payer: Medicare Other | Admitting: Internal Medicine

## 2011-07-14 DIAGNOSIS — C50919 Malignant neoplasm of unspecified site of unspecified female breast: Secondary | ICD-10-CM

## 2011-07-29 ENCOUNTER — Other Ambulatory Visit: Payer: Self-pay | Admitting: General Surgery

## 2011-07-29 DIAGNOSIS — N6459 Other signs and symptoms in breast: Secondary | ICD-10-CM

## 2011-08-19 ENCOUNTER — Encounter: Payer: Medicare Other | Admitting: Internal Medicine

## 2011-08-19 DIAGNOSIS — Z5112 Encounter for antineoplastic immunotherapy: Secondary | ICD-10-CM

## 2011-08-19 DIAGNOSIS — C50919 Malignant neoplasm of unspecified site of unspecified female breast: Secondary | ICD-10-CM

## 2011-08-26 ENCOUNTER — Encounter: Payer: Medicare Other | Admitting: Internal Medicine

## 2011-08-26 DIAGNOSIS — C50919 Malignant neoplasm of unspecified site of unspecified female breast: Secondary | ICD-10-CM

## 2011-08-26 DIAGNOSIS — Z5112 Encounter for antineoplastic immunotherapy: Secondary | ICD-10-CM

## 2011-08-29 ENCOUNTER — Encounter: Payer: Medicare Other | Admitting: Internal Medicine

## 2011-08-29 DIAGNOSIS — N61 Mastitis without abscess: Secondary | ICD-10-CM

## 2011-08-29 DIAGNOSIS — F411 Generalized anxiety disorder: Secondary | ICD-10-CM

## 2011-08-29 DIAGNOSIS — C50919 Malignant neoplasm of unspecified site of unspecified female breast: Secondary | ICD-10-CM

## 2011-09-01 ENCOUNTER — Telehealth: Payer: Self-pay | Admitting: Internal Medicine

## 2011-09-01 NOTE — Telephone Encounter (Signed)
needs pacer clearance for radiation from dr Roselind Messier, faxed info 7-17

## 2011-09-01 NOTE — Telephone Encounter (Signed)
Form is on cart for Dr Johney Frame to sign

## 2011-09-02 ENCOUNTER — Encounter: Payer: Medicare Other | Admitting: Internal Medicine

## 2011-09-02 DIAGNOSIS — Z5112 Encounter for antineoplastic immunotherapy: Secondary | ICD-10-CM

## 2011-09-02 DIAGNOSIS — E860 Dehydration: Secondary | ICD-10-CM

## 2011-09-02 DIAGNOSIS — C50919 Malignant neoplasm of unspecified site of unspecified female breast: Secondary | ICD-10-CM

## 2011-09-02 NOTE — Telephone Encounter (Signed)
OK to proceed with radiation.  Device should be checked after each session. Please fax clearance. 

## 2011-09-03 ENCOUNTER — Encounter: Payer: Medicare Other | Admitting: Internal Medicine

## 2011-09-03 DIAGNOSIS — C50919 Malignant neoplasm of unspecified site of unspecified female breast: Secondary | ICD-10-CM

## 2011-09-09 ENCOUNTER — Encounter: Payer: Medicare Other | Admitting: Internal Medicine

## 2011-09-09 DIAGNOSIS — C50919 Malignant neoplasm of unspecified site of unspecified female breast: Secondary | ICD-10-CM

## 2011-09-09 DIAGNOSIS — Z5112 Encounter for antineoplastic immunotherapy: Secondary | ICD-10-CM

## 2011-09-12 ENCOUNTER — Encounter: Payer: Medicare Other | Admitting: Hematology and Oncology

## 2011-09-12 DIAGNOSIS — Z170 Estrogen receptor positive status [ER+]: Secondary | ICD-10-CM

## 2011-09-12 DIAGNOSIS — E669 Obesity, unspecified: Secondary | ICD-10-CM

## 2011-09-12 DIAGNOSIS — C50919 Malignant neoplasm of unspecified site of unspecified female breast: Secondary | ICD-10-CM

## 2011-09-12 DIAGNOSIS — I499 Cardiac arrhythmia, unspecified: Secondary | ICD-10-CM

## 2011-09-16 ENCOUNTER — Encounter: Payer: Medicare Other | Admitting: Hematology and Oncology

## 2011-09-16 DIAGNOSIS — Z5112 Encounter for antineoplastic immunotherapy: Secondary | ICD-10-CM

## 2011-09-16 DIAGNOSIS — C50919 Malignant neoplasm of unspecified site of unspecified female breast: Secondary | ICD-10-CM

## 2011-09-19 ENCOUNTER — Encounter (INDEPENDENT_AMBULATORY_CARE_PROVIDER_SITE_OTHER): Payer: Self-pay | Admitting: Surgery

## 2011-09-19 ENCOUNTER — Ambulatory Visit (INDEPENDENT_AMBULATORY_CARE_PROVIDER_SITE_OTHER): Payer: Medicare Other | Admitting: Surgery

## 2011-09-19 VITALS — BP 130/76 | HR 72 | Temp 97.0°F | Resp 16 | Ht 64.0 in | Wt 265.5 lb

## 2011-09-19 DIAGNOSIS — C50911 Malignant neoplasm of unspecified site of right female breast: Secondary | ICD-10-CM

## 2011-09-19 DIAGNOSIS — I441 Atrioventricular block, second degree: Secondary | ICD-10-CM

## 2011-09-19 DIAGNOSIS — I890 Lymphedema, not elsewhere classified: Secondary | ICD-10-CM

## 2011-09-19 DIAGNOSIS — C50919 Malignant neoplasm of unspecified site of unspecified female breast: Secondary | ICD-10-CM

## 2011-09-19 MED ORDER — HYDROCODONE-ACETAMINOPHEN 7.5-325 MG PO TABS: 1.0000 | ORAL_TABLET | Freq: Four times a day (QID) | ORAL | Status: DC | PRN

## 2011-09-19 MED ORDER — CIPROFLOXACIN HCL 500 MG PO TABS: 500.0000 mg | ORAL_TABLET | Freq: Two times a day (BID) | ORAL | Status: AC

## 2011-09-19 NOTE — Patient Instructions (Signed)
Begin antibiotics and take for two weeks.  See me again in about two weeks. We will call with the result of your biopsy

## 2011-09-20 ENCOUNTER — Encounter (INDEPENDENT_AMBULATORY_CARE_PROVIDER_SITE_OTHER): Payer: Self-pay | Admitting: Surgery

## 2011-09-20 NOTE — Progress Notes (Signed)
NAME: Ashlee Moore DOB: 1960-11-17 MRN: [id number]                                                                                      DATE: 09/19/2011  PCP: Louie Boston, MD Referring Provider: Louie Boston., MD  IMPRESSION:  Lymphedema and/or lymphangitis right breast, status post right lumpectomy for breast cancer and postoperative chemotherapy; rule out locally recurrent breast cancer  PLAN:   I have discussed the situation with the patient. It is not clear to me whether she has a recurrent cancer or that this is some lymphedema/lymphangitis problem with her right breast. Since she did not respond to a course of doxycycline and her skin biopsy was negative for cancer we're still uncertain of the issue. I discussed putting her on another course of a different antibiotic and doing a punch biopsy of the skin of the right breast to further diagnose the situation. She is agreeable.  I prepped the medial aspect of the right breast where the edema of the breast was most noticeable. It was anesthetized with 1% Xylocaine with epinephrine. A 6 mm punch biopsy was taken including some of the fatty tissue. The incision was closed with a single 4-0 nylon suture. Sterile dressing was applied.  I will also start her on Cipro 500 twice a day for 2 weeks. I will see her back in about 2 weeks.  The patient would normally be expected to start radiation therapy at this point in time. However she much prefers to have a mastectomy rather than radiation therapy. She also wishes a prophylactic left mastectomy. I reviewed that with her. However if there is infection Active in the right breast at this time would not be Advisable to do Surgery at this point. If this happens to be persistent and/or recurrent cancer then we need to reassess the overall management strategy.                 CC:  Chief Complaint  Patient presents with  . Breast Cancer    HPI:  ELISEA Moore is a 51 y.o.  female who presents  for evaluation of Edema and erythema of the right breast status post lumpectomy. She was virtually are present on several months ago by a Careers adviser in her hometown. She initially had an excision of a breast cancer but had an involved margin so a reexcision was done. The patient has quite large breasts so this was able to be done in a cosmetic fashion. She also had a subtle low done which was positive and a sentinel node dissection was done. This was followed with chemotherapy which is now completed. She was scheduled for radiation therapy to begin but now has decided he does not wish radiation therapy but would prefer to have a mastectomy. She's also developed significant redness and edema of her right breast and the concern has been raised that this is inflammatory recurrent breast cancer as opposed to lymphedema and/or cellulitis. She was given a course of doxycycline to which she did not respond. She also had a small skin biopsy done which was negative for metastatic disease. Therefore the etiology of  the erythema/edema is still uncertain. She notes that she is not having fevers or chills.  PMH:  has a past medical history of Bipolar disorder; Fibromyalgia; Anemia; Elevated LFTs; AV block, Mobitz 2 (11/14/09); Arthritis; Cancer; Osteoporosis; Chills; Fever; Generalized headaches; Weakness; and Easy bruising.  PSH:   has past surgical history that includes Ectopic pregnancy surgery; Bilateral knee arthroscopy; gastric bypass surgery; plastic surgery on face; left ovary and fallopian tube removed due to chronic pain; PPM (11/14/09); Pacemaker insertion (2011); Cholecystectomy; and Breast surgery (2012).  ALLERGIES:   Allergies  Allergen Reactions  . Penicillins Swelling  . Sulfonamide Derivatives Rash    MEDICATIONS: Current outpatient prescriptions:anastrozole (ARIMIDEX) 1 MG tablet, Daily., Disp: , Rfl: ;  ARIPiprazole (ABILIFY) 20 MG tablet, Take 20 mg by mouth daily.  , Disp: , Rfl: ;  benztropine  (COGENTIN) 1 MG tablet, Take 1 mg by mouth 2 (two) times daily., Disp: , Rfl: ;  ciprofloxacin (CIPRO) 500 MG tablet, Take 1 tablet (500 mg total) by mouth 2 (two) times daily., Disp: 28 tablet, Rfl: 0 clonazePAM (KLONOPIN) 1 MG tablet, Take 1 mg by mouth daily.  , Disp: , Rfl: ;  cyclobenzaprine (FLEXERIL) 5 MG tablet, Take 5 mg by mouth daily.  , Disp: , Rfl: ;  DULoxetine (CYMBALTA) 60 MG capsule, Take 60 mg by mouth daily.  , Disp: , Rfl: ;  HYDROcodone-acetaminophen (NORCO) 7.5-325 MG per tablet, Take 1 tablet by mouth every 6 (six) hours as needed., Disp: 10 tablet, Rfl: 0 omeprazole (PRILOSEC) 20 MG capsule, Take 20 mg by mouth daily.  , Disp: , Rfl: ;  oxybutynin (DITROPAN) 5 MG tablet, Take 5 mg by mouth 2 (two) times daily., Disp: , Rfl: ;  pregabalin (LYRICA) 75 MG capsule, Take 75 mg by mouth 2 (two) times daily., Disp: , Rfl: ;  promethazine (PHENERGAN) 25 MG tablet, Take 25 mg by mouth as needed., Disp: , Rfl: ;  spironolactone (ALDACTONE) 25 MG tablet, Take 25 mg by mouth 2 (two) times daily., Disp: , Rfl:  zolpidem (AMBIEN) 10 MG tablet, Take 10 mg by mouth daily as needed.  , Disp: , Rfl:   ROS: She has filled out our 12 point review of systems and it is negative Except as noted in the past medical and past surgical history.Marland Kitchen EXAM:   Vital signs:BP 130/76  Pulse 72  Temp 97 F (36.1 C) (Temporal)  Resp 16  Ht 5\' 4"  (1.626 m)  Wt 265 lb 8 oz (120.43 kg)  BMI 45.57 kg/m2 General: Patient alert oriented and healthy appearing. No acute distress. Breasts: The left breast is normal. Both breasts are very large. The right breast has a well-healed incision in the upper outer quadrant and a well-healed axillary incision. The entire right breast is somewhat red. There is peau d'orange extending medially. The breast is somewhat warm to touch. It is little bit tender. There is no evidence of fluctuance anywhere. There is a well-healed small scar At the areolar margin from skin biopsy.  DATA  REVIEWED:  I have reviewed over her pathology reports, extensive notes from her medical oncologist, other diagnostic studies.    Gustavia Carie J 09/20/2011  CC: Louie Boston., MD, Louie Boston, MD

## 2011-09-24 ENCOUNTER — Telehealth (INDEPENDENT_AMBULATORY_CARE_PROVIDER_SITE_OTHER): Payer: Self-pay | Admitting: General Surgery

## 2011-09-24 DIAGNOSIS — C50919 Malignant neoplasm of unspecified site of unspecified female breast: Secondary | ICD-10-CM

## 2011-09-24 DIAGNOSIS — Z5112 Encounter for antineoplastic immunotherapy: Secondary | ICD-10-CM

## 2011-09-24 NOTE — Telephone Encounter (Signed)
LMOM for patient to call me back re: pathology results. Pathology benign. Will inform patient when she returns my call and let her know to keep her follow up on 10/03/11 with Dr Jamey Ripa.

## 2011-09-24 NOTE — Telephone Encounter (Signed)
Pt returned call; updated with benign pathology and follow-up appt on Friday, October 03, 2011, at 12:20.

## 2011-09-30 ENCOUNTER — Encounter (INDEPENDENT_AMBULATORY_CARE_PROVIDER_SITE_OTHER): Payer: Medicare Other | Admitting: Surgery

## 2011-09-30 DIAGNOSIS — Z5112 Encounter for antineoplastic immunotherapy: Secondary | ICD-10-CM

## 2011-09-30 DIAGNOSIS — C50919 Malignant neoplasm of unspecified site of unspecified female breast: Secondary | ICD-10-CM

## 2011-10-02 ENCOUNTER — Encounter: Payer: Medicare Other | Admitting: Internal Medicine

## 2011-10-02 DIAGNOSIS — Z170 Estrogen receptor positive status [ER+]: Secondary | ICD-10-CM

## 2011-10-02 DIAGNOSIS — C50919 Malignant neoplasm of unspecified site of unspecified female breast: Secondary | ICD-10-CM

## 2011-10-02 DIAGNOSIS — R609 Edema, unspecified: Secondary | ICD-10-CM

## 2011-10-03 ENCOUNTER — Encounter (INDEPENDENT_AMBULATORY_CARE_PROVIDER_SITE_OTHER): Payer: Self-pay | Admitting: Surgery

## 2011-10-03 ENCOUNTER — Ambulatory Visit (INDEPENDENT_AMBULATORY_CARE_PROVIDER_SITE_OTHER): Payer: Medicare Other | Admitting: Surgery

## 2011-10-03 VITALS — BP 130/72 | HR 68 | Temp 96.8°F | Resp 14 | Ht 64.0 in | Wt 265.5 lb

## 2011-10-03 DIAGNOSIS — C50919 Malignant neoplasm of unspecified site of unspecified female breast: Secondary | ICD-10-CM

## 2011-10-03 DIAGNOSIS — C50911 Malignant neoplasm of unspecified site of right female breast: Secondary | ICD-10-CM

## 2011-10-03 NOTE — Patient Instructions (Signed)
We will schedule bilateral mastectomies

## 2011-10-03 NOTE — Progress Notes (Signed)
Chief complaint: Breast cancer right breast upper outer quadrant, status post right partial mastectomy and chemotherapy  History of present illness: This patient was found to have a breast cancer several months ago and underwent lumpectomy with axillary dissection. She was subsequently treated with chemotherapy. She was due to start radiation therapy but has now decided to have a mastectomy instead. She also wants a prophylactic mastectomy on the other side. She developed what appears to be some lymphedema but there had been concern that this was persistent or recurrent cancer. 2 punch biopsies have been benign. It saw this represents some form of inflammatory process. It has not responded to antibiotics so it is not thought to be infectious.  I saw her 2 weeks ago and she came back for followup today. She is also discussed her situation with her medical oncologist. She would like to go ahead and schedule surgery. Since last seen she has had no further problems or symptoms.  Past history family history and review of systems are noted in the electronic medical record as well as her note from a few weeks ago.  Exam: VITAL SIGNS: BP 130/72  Pulse 68  Temp 96.8 F (36 C) (Temporal)  Resp 14  Ht 5\' 4"  (1.626 m)  Wt 265 lb 8 oz (120.43 kg)  BMI 45.57 kg/m2 GENERAL:  The patient is alert, oriented, and generally healthy-appearing, NAD. Mood and affect are normal.  HEENT:  The head is normocephalic, the eyes nonicteric, the pupils were round regular and equal. EOMs are normal. Pharynx normal. Dentition good.  NECK:  The neck is supple and there are no masses or thyromegaly.  LUNGS: Normal respirations and clear to auscultation.  HEART: Regular rhythm, with no murmurs rubs or gallops. Pulses are intact carotid dorsalis pedis and posterior tibial. No significant varicosities are noted.  BREASTS:  The right breast remains with some mild peau d'orange edema. Is not tender. The biopsy site from  the punch biopsy site healed. Both breasts are very very large. There is not appear to be any other issue in either breast. Lumpectomy site is well-healed.  ABDOMEN: Soft, flat, and nontender. No masses or organomegaly is noted. No hernias are noted. Bowel sounds are normal.  EXTREMITIES:  Good range of motion, no edema.  IMPRESSION: Right breast cancer, upper-outer quadrant, treated so far with lumpectomy and chemotherapy but needs definitive therapy either radiation or mastectomy.  PLAN: Will go ahead and schedule the patient for a right total mastectomy. Will also plan to do a left total mastectomy with no lymph node evaluation since there is no evidence of cancer on the left side. I've gone over the indications risks and complications with the patient. She understands there is risk for bleeding infection nonhealing of the flaps etc. She also knows she'll need to have some drains and may be in the hospital for a couple of days. I think all of her questions have been answered and she wishes to proceed to scheduling.

## 2011-10-07 ENCOUNTER — Other Ambulatory Visit (HOSPITAL_COMMUNITY): Payer: Medicare Other

## 2011-10-07 DIAGNOSIS — Z5112 Encounter for antineoplastic immunotherapy: Secondary | ICD-10-CM

## 2011-10-07 DIAGNOSIS — C50919 Malignant neoplasm of unspecified site of unspecified female breast: Secondary | ICD-10-CM

## 2011-10-09 ENCOUNTER — Encounter (HOSPITAL_COMMUNITY): Payer: Self-pay | Admitting: Pharmacy Technician

## 2011-10-17 ENCOUNTER — Encounter (HOSPITAL_COMMUNITY)
Admission: RE | Admit: 2011-10-17 | Discharge: 2011-10-17 | Disposition: A | Discharge status: Home / Self Care | Payer: Medicare Other | Source: Ambulatory Visit | Attending: Surgery | Admitting: Surgery

## 2011-10-17 ENCOUNTER — Other Ambulatory Visit: Payer: Self-pay

## 2011-10-17 ENCOUNTER — Other Ambulatory Visit (INDEPENDENT_AMBULATORY_CARE_PROVIDER_SITE_OTHER): Payer: Self-pay | Admitting: Surgery

## 2011-10-17 ENCOUNTER — Encounter (HOSPITAL_COMMUNITY): Payer: Self-pay

## 2011-10-17 ENCOUNTER — Ambulatory Visit (HOSPITAL_COMMUNITY)
Admission: RE | Admit: 2011-10-17 | Discharge: 2011-10-17 | Disposition: A | Discharge status: Home / Self Care | Payer: Medicare Other | Source: Ambulatory Visit | Attending: Surgery | Admitting: Surgery

## 2011-10-17 DIAGNOSIS — Z950 Presence of cardiac pacemaker: Secondary | ICD-10-CM | POA: Insufficient documentation

## 2011-10-17 DIAGNOSIS — Z01812 Encounter for preprocedural laboratory examination: Secondary | ICD-10-CM | POA: Insufficient documentation

## 2011-10-17 DIAGNOSIS — C50919 Malignant neoplasm of unspecified site of unspecified female breast: Secondary | ICD-10-CM | POA: Insufficient documentation

## 2011-10-17 DIAGNOSIS — Z01810 Encounter for preprocedural cardiovascular examination: Secondary | ICD-10-CM | POA: Insufficient documentation

## 2011-10-17 HISTORY — DX: Presence of cardiac pacemaker: Z95.0

## 2011-10-17 HISTORY — DX: Gastro-esophageal reflux disease without esophagitis: K21.9

## 2011-10-17 HISTORY — DX: Sleep apnea, unspecified: G47.30

## 2011-10-17 LAB — BASIC METABOLIC PANEL
Calcium: 9.5 mg/dL (ref 8.4–10.5)
Creatinine, Ser: 0.73 mg/dL (ref 0.50–1.10)
GFR calc Af Amer: >90 mL/min (ref >90)
GFR calc non Af Amer: >90 mL/min (ref >90)

## 2011-10-17 LAB — CBC
MCH: 25.9 pg — ABNORMAL LOW (ref 26.0–34.0)
MCHC: 31.8 g/dL (ref 30.0–36.0)
MCV: 81.2 fL (ref 78.0–100.0)
Platelets: 422 10*3/uL — ABNORMAL HIGH (ref 150–400)
RDW: 15.7 % — ABNORMAL HIGH (ref 11.5–15.5)

## 2011-10-17 LAB — SURGICAL PCR SCREEN

## 2011-10-17 MED ORDER — VANCOMYCIN HCL 1000 MG IV SOLR: 1000.0000 mg | Freq: Once | INTRAVENOUS | Status: DC

## 2011-10-17 NOTE — Pre-Procedure Instructions (Signed)
PACEMAKER orders placed on chart.  LOV  Dr Johney Frame 9/12 EPIC, last interrogation  3/13 EPIC.  Notified THOMAS at [employer] of procedure date and time- will have rep return my call for verification

## 2011-10-17 NOTE — Patient Instructions (Signed)
20 Ashlee Moore  10/17/2011   Your procedure is scheduled on:  10/20/11  Monday  Surgery [id number]  Report to [facility] at   0700    AM.  Call this number if you have problems the morning of surgery: [phone]     Or PST   [phone]  [facility]   Remember: Stop antiinflammatories and herbals 5 days before surgery  Do not eat food or drink any :After Midnight.  Sunday NIGHT    Take these medicines the morning of surgery with A SIP OF WATER:  PROLISEC, LYRICA                              May take Klonopin, or norco or phenergran if needed   Do not wear jewelry, make-up or nail polish.  Do not wear lotions, powders, or perfumes. You may wear deodorant.  Do not shave 48 hours prior to surgery.  Do not bring valuables to the hospital.  Contacts, dentures or bridgework may not be worn into surgery.  Leave suitcase in the car. After surgery it may be brought to your room.  For patients admitted to the hospital, checkout time is 11:00 AM the day of discharge.   Patients discharged the day of surgery will not be allowed to drive home.  Name and phone number of your driver:  son                                                                    Special Instructions: CHG Shower Use Special Wash: 1/2 bottle night before surgery and 1/2 bottle morning of surgery. REGULAR SOAP FACE AND PRIVATES              LADIES- NO SHAVING 48 HOURS BEFORE USING BETASEPT SOAP.                   Please read over the following fact sheets that you were given: MRSA Information

## 2011-10-17 NOTE — Progress Notes (Signed)
10/17/11 1004  OBSTRUCTIVE SLEEP APNEA  Have you ever been diagnosed with sleep apnea through a sleep study? No  Do you snore loudly (loud enough to be heard through closed doors)?  1  Do you often feel tired, fatigued, or sleepy during the daytime? 1  Has anyone observed you stop breathing during your sleep? 0  Do you have, or are you being treated for high blood pressure? 0  BMI more than 35 kg/m2? 1  Age over 52 years old? 1  Neck circumference greater than 40 cm/18 inches? 0  Gender: 0  Obstructive Sleep Apnea Score 4   Score 4 or greater  Updated health history;Results sent to PCP

## 2011-10-17 NOTE — Pre-Procedure Instructions (Signed)
Spoke with Angeline Slim, [employer] REP and informed her of surgery day and time, and of guidelines order per Dr Johney Frame..  Notified patient of pos MRSA- instructed her to begin Mupirocin today, and good handwashing, isolation procedures

## 2011-10-17 NOTE — Progress Notes (Signed)
Dr Jamey Ripa-  ANCEF ordered pre op-   HAS ALLERGY_  PENICILLIN CAUSES THROAT TO SWELL-  Please clarify. Thanks

## 2011-10-19 MED ORDER — VANCOMYCIN HCL 1000 MG IV SOLR
1500.0000 mg | INTRAVENOUS | Status: AC
  Administered 2011-10-20: 1500 mg via INTRAVENOUS
  Filled 2011-10-19 (×2): qty 1500

## 2011-10-20 ENCOUNTER — Encounter (HOSPITAL_COMMUNITY)
Admission: RE | Disposition: A | Discharge status: Home / Self Care | Payer: Self-pay | Source: Ambulatory Visit | Attending: Surgery

## 2011-10-20 ENCOUNTER — Encounter (HOSPITAL_COMMUNITY): Payer: Self-pay | Admitting: Anesthesiology

## 2011-10-20 ENCOUNTER — Inpatient Hospital Stay (HOSPITAL_COMMUNITY)
Admission: RE | Admit: 2011-10-20 | Discharge: 2011-10-23 | DRG: 582 | Disposition: A | Discharge status: Home / Self Care | Payer: Medicare Other | Source: Ambulatory Visit | Attending: Surgery | Admitting: Surgery

## 2011-10-20 ENCOUNTER — Inpatient Hospital Stay (HOSPITAL_COMMUNITY): Payer: Medicare Other | Admitting: Anesthesiology

## 2011-10-20 ENCOUNTER — Encounter (HOSPITAL_COMMUNITY): Payer: Self-pay | Admitting: *Deleted

## 2011-10-20 DIAGNOSIS — D0590 Unspecified type of carcinoma in situ of unspecified breast: Secondary | ICD-10-CM

## 2011-10-20 DIAGNOSIS — Z79899 Other long term (current) drug therapy: Secondary | ICD-10-CM

## 2011-10-20 DIAGNOSIS — N6019 Diffuse cystic mastopathy of unspecified breast: Secondary | ICD-10-CM

## 2011-10-20 DIAGNOSIS — I441 Atrioventricular block, second degree: Secondary | ICD-10-CM | POA: Diagnosis present

## 2011-10-20 DIAGNOSIS — K219 Gastro-esophageal reflux disease without esophagitis: Secondary | ICD-10-CM | POA: Diagnosis present

## 2011-10-20 DIAGNOSIS — Z950 Presence of cardiac pacemaker: Secondary | ICD-10-CM

## 2011-10-20 DIAGNOSIS — C50419 Malignant neoplasm of upper-outer quadrant of unspecified female breast: Principal | ICD-10-CM | POA: Diagnosis present

## 2011-10-20 DIAGNOSIS — I890 Lymphedema, not elsewhere classified: Secondary | ICD-10-CM | POA: Diagnosis present

## 2011-10-20 DIAGNOSIS — Z9221 Personal history of antineoplastic chemotherapy: Secondary | ICD-10-CM

## 2011-10-20 DIAGNOSIS — Z6841 Body Mass Index (BMI) 40.0 and over, adult: Secondary | ICD-10-CM

## 2011-10-20 DIAGNOSIS — C773 Secondary and unspecified malignant neoplasm of axilla and upper limb lymph nodes: Secondary | ICD-10-CM | POA: Diagnosis present

## 2011-10-20 DIAGNOSIS — F319 Bipolar disorder, unspecified: Secondary | ICD-10-CM | POA: Diagnosis present

## 2011-10-20 SURGERY — MASTECTOMY, SIMPLE
Anesthesia: General | Site: Breast | Laterality: Bilateral | Wound class: Clean

## 2011-10-20 MED ORDER — ACETAMINOPHEN 10 MG/ML IV SOLN: 1000.0000 mg | Freq: Once | INTRAVENOUS | Status: DC | PRN

## 2011-10-20 MED ORDER — PREGABALIN 50 MG PO CAPS
100.0000 mg | ORAL_CAPSULE | Freq: Two times a day (BID) | ORAL | Status: DC
  Administered 2011-10-20 – 2011-10-23 (×6): 100 mg via ORAL
  Filled 2011-10-20: qty 2
  Filled 2011-10-20: qty 1
  Filled 2011-10-20 (×5): qty 2

## 2011-10-20 MED ORDER — VANCOMYCIN HCL 1000 MG IV SOLR
1250.0000 mg | Freq: Two times a day (BID) | INTRAVENOUS | Status: AC
  Administered 2011-10-20 – 2011-10-21 (×2): 1250 mg via INTRAVENOUS
  Filled 2011-10-20 (×2): qty 1250

## 2011-10-20 MED ORDER — KCL IN DEXTROSE-NACL 20-5-0.45 MEQ/L-%-% IV SOLN
INTRAVENOUS | Status: DC
  Administered 2011-10-20 – 2011-10-21 (×3): via INTRAVENOUS
  Filled 2011-10-20 (×4): qty 1000

## 2011-10-20 MED ORDER — MIDAZOLAM HCL 5 MG/5ML IJ SOLN
INTRAMUSCULAR | Status: DC | PRN
  Administered 2011-10-20: 2 mg via INTRAVENOUS

## 2011-10-20 MED ORDER — FENTANYL CITRATE 0.05 MG/ML IJ SOLN
INTRAMUSCULAR | Status: DC | PRN
  Administered 2011-10-20 (×5): 50 ug via INTRAVENOUS

## 2011-10-20 MED ORDER — OXYBUTYNIN CHLORIDE 5 MG PO TABS
5.0000 mg | ORAL_TABLET | Freq: Two times a day (BID) | ORAL | Status: DC
  Administered 2011-10-20 – 2011-10-23 (×6): 5 mg via ORAL
  Filled 2011-10-20 (×7): qty 1

## 2011-10-20 MED ORDER — PROMETHAZINE HCL 25 MG PO TABS: 25.0000 mg | ORAL_TABLET | ORAL | Status: DC | PRN

## 2011-10-20 MED ORDER — LACTATED RINGERS IV SOLN
INTRAVENOUS | Status: DC
  Administered 2011-10-20: 13:00:00 via INTRAVENOUS
  Administered 2011-10-20: 1000 mL via INTRAVENOUS

## 2011-10-20 MED ORDER — OXYCODONE HCL 5 MG/5ML PO SOLN: 5.0000 mg | Freq: Once | ORAL | Status: DC | PRN

## 2011-10-20 MED ORDER — LIDOCAINE HCL (CARDIAC) 20 MG/ML IV SOLN
INTRAVENOUS | Status: DC | PRN
  Administered 2011-10-20: 100 mg via INTRAVENOUS

## 2011-10-20 MED ORDER — ROCURONIUM BROMIDE 100 MG/10ML IV SOLN
INTRAVENOUS | Status: DC | PRN
  Administered 2011-10-20: 50 mg via INTRAVENOUS
  Administered 2011-10-20: 20 mg via INTRAVENOUS

## 2011-10-20 MED ORDER — PROPOFOL 10 MG/ML IV BOLUS
INTRAVENOUS | Status: DC | PRN
  Administered 2011-10-20: 200 mg via INTRAVENOUS

## 2011-10-20 MED ORDER — HYDROCODONE-ACETAMINOPHEN 7.5-325 MG PO TABS
1.0000 | ORAL_TABLET | ORAL | Status: DC | PRN
  Administered 2011-10-21 – 2011-10-22 (×7): 1 via ORAL
  Filled 2011-10-20 (×8): qty 1

## 2011-10-20 MED ORDER — DULOXETINE HCL 60 MG PO CPEP
60.0000 mg | ORAL_CAPSULE | Freq: Every evening | ORAL | Status: DC
  Administered 2011-10-20 – 2011-10-22 (×3): 60 mg via ORAL
  Filled 2011-10-20 (×4): qty 1

## 2011-10-20 MED ORDER — OXYCODONE HCL 5 MG PO TABS: 5.0000 mg | ORAL_TABLET | Freq: Once | ORAL | Status: DC | PRN

## 2011-10-20 MED ORDER — CLONAZEPAM 1 MG PO TABS
1.0000 mg | ORAL_TABLET | Freq: Every day | ORAL | Status: DC | PRN
  Administered 2011-10-22: 1 mg via ORAL
  Filled 2011-10-20: qty 1

## 2011-10-20 MED ORDER — PANTOPRAZOLE SODIUM 40 MG PO TBEC
40.0000 mg | DELAYED_RELEASE_TABLET | Freq: Every day | ORAL | Status: DC
  Administered 2011-10-20 – 2011-10-22 (×3): 40 mg via ORAL
  Filled 2011-10-20 (×4): qty 1

## 2011-10-20 MED ORDER — LACTATED RINGERS IV SOLN
INTRAVENOUS | Status: DC | PRN
  Administered 2011-10-20 (×2): via INTRAVENOUS

## 2011-10-20 MED ORDER — HYDROMORPHONE HCL PF 1 MG/ML IJ SOLN
0.2500 mg | INTRAMUSCULAR | Status: DC | PRN
  Administered 2011-10-20: 0.5 mg via INTRAVENOUS

## 2011-10-20 MED ORDER — 0.9 % SODIUM CHLORIDE (POUR BTL) OPTIME
TOPICAL | Status: DC | PRN
  Administered 2011-10-20: 1000 mL

## 2011-10-20 MED ORDER — HYDROMORPHONE HCL PF 1 MG/ML IJ SOLN
INTRAMUSCULAR | Status: DC | PRN
  Administered 2011-10-20: 1 mg via INTRAVENOUS
  Administered 2011-10-20 (×2): 0.5 mg via INTRAVENOUS

## 2011-10-20 MED ORDER — SPIRONOLACTONE 25 MG PO TABS
25.0000 mg | ORAL_TABLET | Freq: Two times a day (BID) | ORAL | Status: DC
  Administered 2011-10-20 – 2011-10-23 (×6): 25 mg via ORAL
  Filled 2011-10-20 (×7): qty 1

## 2011-10-20 MED ORDER — SODIUM CHLORIDE 0.9 % IR SOLN
Status: DC | PRN
  Administered 2011-10-20: 11:00:00

## 2011-10-20 MED ORDER — GLYCOPYRROLATE 0.2 MG/ML IJ SOLN
INTRAMUSCULAR | Status: DC | PRN
  Administered 2011-10-20: 0.2 mg via INTRAVENOUS

## 2011-10-20 MED ORDER — ANASTROZOLE 1 MG PO TABS
1.0000 mg | ORAL_TABLET | Freq: Every day | ORAL | Status: DC
  Administered 2011-10-20 – 2011-10-22 (×3): 1 mg via ORAL
  Filled 2011-10-20 (×4): qty 1

## 2011-10-20 MED ORDER — PROMETHAZINE HCL 25 MG/ML IJ SOLN: 6.2500 mg | INTRAMUSCULAR | Status: DC | PRN

## 2011-10-20 MED ORDER — MEPERIDINE HCL 50 MG/ML IJ SOLN: 6.2500 mg | INTRAMUSCULAR | Status: DC | PRN

## 2011-10-20 MED ORDER — ONDANSETRON HCL 4 MG/2ML IJ SOLN
INTRAMUSCULAR | Status: DC | PRN
  Administered 2011-10-20: 4 mg via INTRAVENOUS

## 2011-10-20 MED ORDER — HYDROMORPHONE HCL PF 2 MG/ML IJ SOLN
2.0000 mg | INTRAMUSCULAR | Status: DC | PRN
  Administered 2011-10-20 – 2011-10-22 (×8): 2 mg via INTRAVENOUS
  Filled 2011-10-20 (×8): qty 1

## 2011-10-20 MED ORDER — NEOSTIGMINE METHYLSULFATE 1 MG/ML IJ SOLN
INTRAMUSCULAR | Status: DC | PRN
  Administered 2011-10-20: 2 mg via INTRAVENOUS

## 2011-10-20 SURGICAL SUPPLY — 33 items
APPLIER CLIP 11 MED OPEN (CLIP) ×2
APR CLP MED 11 20 MLT OPN (CLIP) ×1
BANDAGE ELASTIC 6 VELCRO ST LF (GAUZE/BANDAGES/DRESSINGS) ×2 IMPLANT
BINDER BREAST XLRG (GAUZE/BANDAGES/DRESSINGS) ×1 IMPLANT
CLIP APPLIE 11 MED OPEN (CLIP) ×1 IMPLANT
CLOTH BEACON ORANGE TIMEOUT ST (SAFETY) ×2 IMPLANT
DRAIN CHANNEL RND F F (WOUND CARE) ×4 IMPLANT
DRAPE LAPAROSCOPIC ABDOMINAL (DRAPES) IMPLANT
DRAPE LG THREE QUARTER DISP (DRAPES) ×1 IMPLANT
DRSG EMULSION OIL 3X16 NADH (GAUZE/BANDAGES/DRESSINGS) ×1 IMPLANT
DRSG PAD ABDOMINAL 8X10 ST (GAUZE/BANDAGES/DRESSINGS) ×1 IMPLANT
DRSG TEGADERM 4X4.75 (GAUZE/BANDAGES/DRESSINGS) ×1 IMPLANT
ELECT BLADE TIP CTD 4 INCH (ELECTRODE) ×1 IMPLANT
EVACUATOR SILICONE 100CC (DRAIN) ×4 IMPLANT
GLOVE BIOGEL PI IND STRL 7.0 (GLOVE) ×1 IMPLANT
GLOVE BIOGEL PI INDICATOR 7.0 (GLOVE) ×1
GLOVE EUDERMIC 7 POWDERFREE (GLOVE) ×2 IMPLANT
GOWN STRL NON-REIN LRG LVL3 (GOWN DISPOSABLE) ×2 IMPLANT
GOWN STRL REIN XL XLG (GOWN DISPOSABLE) ×4 IMPLANT
KIT BASIN OR (CUSTOM PROCEDURE TRAY) ×2 IMPLANT
NEEDLE HYPO 22GX1.5 SAFETY (NEEDLE) IMPLANT
PACK GENERAL/GYN (CUSTOM PROCEDURE TRAY) ×2 IMPLANT
PACK UNIVERSAL I (CUSTOM PROCEDURE TRAY) IMPLANT
PAD ABD 7.5X8 STRL (GAUZE/BANDAGES/DRESSINGS) ×4 IMPLANT
SPONGE DRAIN TRACH 4X4 STRL 2S (GAUZE/BANDAGES/DRESSINGS) ×4 IMPLANT
SPONGE GAUZE 4X4 12PLY (GAUZE/BANDAGES/DRESSINGS) ×2 IMPLANT
STAPLER VISISTAT 35W (STAPLE) ×2 IMPLANT
SUT ETHILON 3 0 PS 1 (SUTURE) ×4 IMPLANT
SUT SILK 2 0 SH (SUTURE) IMPLANT
SYR CONTROL 10ML LL (SYRINGE) IMPLANT
TAPE CLOTH SURG 6X10 WHT LF (GAUZE/BANDAGES/DRESSINGS) ×1 IMPLANT
TOWEL OR 17X26 10 PK STRL BLUE (TOWEL DISPOSABLE) ×2 IMPLANT
YANKAUER SUCT BULB TIP NO VENT (SUCTIONS) ×1 IMPLANT

## 2011-10-20 NOTE — Transfer of Care (Signed)
Immediate Anesthesia Transfer of Care Note  Patient: Ashlee Moore  Procedure(s) Performed: Procedure(s) (LRB) with comments: TOTAL MASTECTOMY (Bilateral) - Bilateral Total Mastectomy  Patient Location: PACU  Anesthesia Type: General  Level of Consciousness: awake, alert , oriented and patient cooperative  Airway & Oxygen Therapy: Patient Spontanous Breathing and Patient connected to face mask oxygen  Post-op Assessment: Report given to PACU RN, Post -op Vital signs reviewed and stable and Patient moving all extremities X 4  Post vital signs: Reviewed and stable  Complications: No apparent anesthesia complications

## 2011-10-20 NOTE — Interval H&P Note (Signed)
History and Physical Interval Note:  10/20/2011 9:10 AM  Ashlee Moore  has presented today for surgery, with the diagnosis of right breast cancer  The various methods of treatment have been discussed with the patient and family. After consideration of risks, benefits and other options for treatment, the patient has consented to  Procedure(s) (LRB) with comments: TOTAL MASTECTOMY (Bilateral) - Bilateral Total Mastectomy as a surgical intervention .  The patient's history has been reviewed, patient examined, no change in status, stable for surgery.  I have reviewed the patient's chart and labs.  Questions were answered to the patient's satisfaction.     Council Munguia J

## 2011-10-20 NOTE — Anesthesia Procedure Notes (Signed)
Procedure Name: Intubation Date/Time: 10/20/2011 9:54 AM Performed by: Thornell Mule Pre-anesthesia Checklist: Patient identified, Emergency Drugs available, Suction available, Patient being monitored and Timeout performed Patient Re-evaluated:Patient Re-evaluated prior to inductionOxygen Delivery Method: Circle system utilized Preoxygenation: Pre-oxygenation with 100% oxygen Intubation Type: IV induction Ventilation: Mask ventilation without difficulty and Oral airway inserted - appropriate to patient size Laryngoscope Size: Miller and 3 Grade View: Grade I Tube type: Oral Tube size: 7.0 mm Number of attempts: 1 Airway Equipment and Method: Stylet and Oral airway Placement Confirmation: ETT inserted through vocal cords under direct vision,  positive ETCO2 and breath sounds checked- equal and bilateral Secured at: 22 cm Tube secured with: Tape Dental Injury: Teeth and Oropharynx as per pre-operative assessment

## 2011-10-20 NOTE — H&P (View-Only) (Signed)
Chief complaint: Breast cancer right breast upper outer quadrant, status post right partial mastectomy and chemotherapy  History of present illness: This patient was found to have a breast cancer several months ago and underwent lumpectomy with axillary dissection. She was subsequently treated with chemotherapy. She was due to start radiation therapy but has now decided to have a mastectomy instead. She also wants a prophylactic mastectomy on the other side. She developed what appears to be some lymphedema but there had been concern that this was persistent or recurrent cancer. 2 punch biopsies have been benign. It saw this represents some form of inflammatory process. It has not responded to antibiotics so it is not thought to be infectious.  I saw her 2 weeks ago and she came back for followup today. She is also discussed her situation with her medical oncologist. She would like to go ahead and schedule surgery. Since last seen she has had no further problems or symptoms.  Past history family history and review of systems are noted in the electronic medical record as well as her note from a few weeks ago.  Exam: VITAL SIGNS: BP 130/72  Pulse 68  Temp 96.8 F (36 C) (Temporal)  Resp 14  Ht 5' 4" (1.626 m)  Wt 265 lb 8 oz (120.43 kg)  BMI 45.57 kg/m2 GENERAL:  The patient is alert, oriented, and generally healthy-appearing, NAD. Mood and affect are normal.  HEENT:  The head is normocephalic, the eyes nonicteric, the pupils were round regular and equal. EOMs are normal. Pharynx normal. Dentition good.  NECK:  The neck is supple and there are no masses or thyromegaly.  LUNGS: Normal respirations and clear to auscultation.  HEART: Regular rhythm, with no murmurs rubs or gallops. Pulses are intact carotid dorsalis pedis and posterior tibial. No significant varicosities are noted.  BREASTS:  The right breast remains with some mild peau d'orange edema. Is not tender. The biopsy site from  the punch biopsy site healed. Both breasts are very very large. There is not appear to be any other issue in either breast. Lumpectomy site is well-healed.  ABDOMEN: Soft, flat, and nontender. No masses or organomegaly is noted. No hernias are noted. Bowel sounds are normal.  EXTREMITIES:  Good range of motion, no edema.  IMPRESSION: Right breast cancer, upper-outer quadrant, treated so far with lumpectomy and chemotherapy but needs definitive therapy either radiation or mastectomy.  PLAN: Will go ahead and schedule the patient for a right total mastectomy. Will also plan to do a left total mastectomy with no lymph node evaluation since there is no evidence of cancer on the left side. I've gone over the indications risks and complications with the patient. She understands there is risk for bleeding infection nonhealing of the flaps etc. She also knows she'll need to have some drains and may be in the hospital for a couple of days. I think all of her questions have been answered and she wishes to proceed to scheduling.  

## 2011-10-20 NOTE — Anesthesia Preprocedure Evaluation (Addendum)
Anesthesia Evaluation  Patient identified by MRN, date of birth, ID band Patient awake    Reviewed: Allergy & Precautions, H&P , NPO status , Patient's Chart, lab work & pertinent test results  Airway Mallampati: II TM Distance: >3 FB Neck ROM: Full    Dental  (+) Poor Dentition, Partial Upper and Partial Lower   Pulmonary  breath sounds clear to auscultation  Pulmonary exam normal       Cardiovascular Exercise Tolerance: Poor - CAD, - Past MI and - Cardiac Stents + dysrhythmias + pacemaker Rhythm:Regular Rate:Normal  Mobitz II AVNB, pacemaker for associated bradycardia   Neuro/Psych  Headaches, PSYCHIATRIC DISORDERS Bipolar Disorder    GI/Hepatic Neg liver ROS, GERD-  Medicated and Controlled,  Endo/Other  negative endocrine ROS  Renal/GU negative Renal ROS     Musculoskeletal  (+) Fibromyalgia -  Abdominal (+) + obese,   Peds  Hematology   Anesthesia Other Findings   Reproductive/Obstetrics                          Anesthesia Physical Anesthesia Plan  ASA: III  Anesthesia Plan: General   Post-op Pain Management:    Induction:   Airway Management Planned: Oral ETT  Additional Equipment:   Intra-op Plan:   Post-operative Plan: Extubation in OR  Informed Consent: I have reviewed the patients History and Physical, chart, labs and discussed the procedure including the risks, benefits and alternatives for the proposed anesthesia with the patient or authorized representative who has indicated his/her understanding and acceptance.   Dental advisory given  Plan Discussed with: CRNA and Surgeon  Anesthesia Plan Comments:         Anesthesia Quick Evaluation

## 2011-10-20 NOTE — Preoperative (Signed)
Beta Blockers   Reason not to administer Beta Blockers:Not Applicable 

## 2011-10-20 NOTE — Anesthesia Postprocedure Evaluation (Signed)
Anesthesia Post Note  Patient: Ashlee Moore  Procedure(s) Performed: Procedure(s) (LRB): TOTAL MASTECTOMY (Bilateral)  Anesthesia type: General  Patient location: PACU  Post pain: Pain level controlled  Post assessment: Post-op Vital signs reviewed  Last Vitals: BP 121/86  Pulse 80  Temp 36.2 C (Oral)  Resp 18  SpO2 98%  Post vital signs: Reviewed  Level of consciousness: sedated  Complications: No apparent anesthesia complications  Pacemaker set in asynchronous mode (DOO) in preop for surgery. Reset to preop mode (DDD) and pt in NSR.

## 2011-10-20 NOTE — Progress Notes (Signed)
Pt arrived to floor, drowsy, oriented x4. Oriented to unit.  Will continue to monitor. Clovia Cuff

## 2011-10-20 NOTE — Progress Notes (Signed)
ANTIBIOTIC CONSULT NOTE - INITIAL  Pharmacy Consult for Vancomycin Indication: 24 hours post-op prophylaxis  Allergies  Allergen Reactions  . Penicillins Swelling  . Sulfonamide Derivatives Rash  . Tape Other (See Comments)    Adhesive tape- RASH, SWELLING   Patient Measurements:    Vital Signs: Temp: 97.4 F (36.3 C) (09/09 1439) Temp src: Oral (09/09 1439) BP: 132/73 mmHg (09/09 1439) Pulse Rate: 92  (09/09 1439) Intake/Output from previous day:   Intake/Output from this shift: Total I/O In: 2000 [I.V.:2000] Out: 320 [Drains:70; Blood:250]  Labs: No results found for this basename: WBC:3,HGB:3,PLT:3,LABCREA:3,CREATININE:3 in the last 72 hours The CrCl is unknown because both a height and weight (above a minimum accepted value) are required for this calculation. No results found for this basename: VANCOTROUGH:2,VANCOPEAK:2,VANCORANDOM:2,GENTTROUGH:2,GENTPEAK:2,GENTRANDOM:2,TOBRATROUGH:2,TOBRAPEAK:2,TOBRARND:2,AMIKACINPEAK:2,AMIKACINTROU:2,AMIKACIN:2, in the last 72 hours   Microbiology: Recent Results (from the past 720 hour(s))  SURGICAL PCR SCREEN     Status: Abnormal   Collection Time   10/17/11  9:42 AM      Component Value Range Status Comment   MRSA, PCR RESULT CALLED TO, READ BACK BY AND VERIFIED WITH: (*) NEGATIVE Final ROBINSON,K. AT 1258 ON [id number] BY LOVE,T.   Staphylococcus aureus POSITIVE (*) NEGATIVE Final     Medical History: Past Medical History  Diagnosis Date  . Bipolar disorder   . Fibromyalgia   . Anemia   . Elevated LFTs     hx  . Arthritis   . Osteoporosis   . Chills   . Fever   . Generalized headaches   . Weakness   . Easy bruising   . Pacemaker   . GERD (gastroesophageal reflux disease)   . Sleep apnea     STOP BANG SCORE 4  . AV block, Mobitz 2 11/14/09    s/p MDT Revo PPM by JA  . Cancer     right breast/LAST CHEOM 10/07/11   Assessment:  51 YOF s/p bilateral total mastectomy 9/9, MD ordered for vancomycin x 24 hours post -op  given staph aureus + on nasal assay (although MRSA PCR negative), has pacemaker and porta cath in place.  Patient received Vancomycin 1500mg  IV x 1 pre-op @ 0843 on 9/9.    Wt 120.7 kg, Scr 0.73, CrCl 156, Normalized 12ml/min  Goal of Therapy:  Adequate coverage x 24 hours  Plan:   Vancomycin 1250 mg IV q12h for 2 doses   Pharmacy will sign off  Geoffry Paradise Thi 10/20/2011,2:44 PM

## 2011-10-20 NOTE — Op Note (Signed)
Ashlee Moore [birth year] [id number] 10/03/2011  Preoperative diagnosis: cancer, right breast, upper outer quadrant, status post lumpectomy, sentinel node, chemotherapy. Desires prophylactic left mastectomy  Postoperative diagnosis: the same  Procedure: bilateral total mastectomy with excision of some right axillary mass  Surgeon: Currie Paris, MD, FACS  Assistant: Dr. Romie Levee  Anesthesia: General   Clinical History and Indications: this patient had a prior lumpectomy and node evaluation several months ago at another facility. She was subsequently treated with chemotherapy she developed what appeared to be lymphedema of the right breast. She was evaluated for radiation therapy as standard treatment. However after lengthy thought about this discussed with her medical oncologist she desired no radiation therapy at all and wished to have a mastectomy. History large breasts and wished to have a prophylactic mastectomy on the left side. This is discussed at length with her she presents today for surgery.    Description of Procedure: I saw the patient a preoperative area and confirmed the plans for the procedure as noted above. The patient was taken to the operating room and after satisfactory general anesthesia was obtained the timeout was done. Both breasts were prepped and draped. I started on the right side and made an elliptical incision. I raised the usual skin flaps going towards the clavicle sternum inframammary fold the latissimus. There is a fair amount of bleeding as this was a somewhat edematous breast from her lymphedema. I identified the Port-A-Cath site leaving it covered with tissue.once the skin flaps were developed the breast was removed from medial to lateral. I got to the axillary area and there was some clips from a prior axillary surgery. I also noticed a very hard 2.5 cm round mass. It wasn't sure if this represented tumor or fat necrosis. It was excised with the  cautery. 2 adjacent hard masses that were small but similar in feeling  also excised as well as what appeared to be one small lymph node.I then trimmed the flaps a few areas on the superior flap where had gotten too thick to make sure we had a standard thickness flap. I made sure everything was dry.  I put 2 19 Blake drains in and secured them with a 2-0 nylon. A check for hemostasis was made and everything appeared to be dry. Laparotomy packs soaked with antibiotic solution are placed in the wound temporarily.  Attention was turned to the left side and a similar mastectomy done. I took care to stay out of the axilla on the left side to avoid any lymph node removal. Again the surgery was  done primarily with cautery with bleeders either sutured or clipped. I made sure everything was dry. I placed 2 19 Blake drains in. I irrigated again and then placed moist packs with antibiotic solution.  I looked again at the right side. I removed the packing everything appeared completely dry with no evidence of bleeding from the time of place attacks until a completed left mastectomy. I irrigated again and closed with staples.  I looked again at the left side and again this remained dry while I was working on the right. There was some excess skin medially, so I excised that to avoid a "dogear. "Again irrigation was done and everything appeared to be dry. The wound was closed with staples.  The patient tolerated the procedure well. There no complications. Counts were all correct. Estimated blood loss was 250 cc.  Currie Paris, MD, FACS 10/20/2011 12:05 PM

## 2011-10-21 MED ORDER — HEPARIN SODIUM (PORCINE) 5000 UNIT/ML IJ SOLN
5000.0000 [IU] | Freq: Three times a day (TID) | INTRAMUSCULAR | Status: DC
  Administered 2011-10-21 – 2011-10-23 (×6): 5000 [IU] via SUBCUTANEOUS
  Filled 2011-10-21 (×9): qty 1

## 2011-10-21 NOTE — Progress Notes (Signed)
1 Day Post-Op  Subjective: Poor pain control, no nausea, not ambulating yet  Objective: Vital signs in last 24 hours: Temp:  [97.1 F (36.2 C)-98.9 F (37.2 C)] 98.9 F (37.2 C) (09/10 0502) Pulse Rate:  [80-92] 91  (09/10 0502) Resp:  [17-18] 18  (09/10 0502) BP: (94-132)/(63-86) 100/69 mmHg (09/10 0502) SpO2:  [95 %-99 %] 96 % (09/10 0502) Weight:  [266 lb (120.657 kg)] 266 lb (120.657 kg) (09/09 1439)   Intake/Output from previous day: 09/09 0701 - 09/10 0700 In: 4271.7 [P.O.:120; I.V.:3901.7; IV Piggyback:250] Out: 1019 [Urine:600; Drains:169; Blood:250] Intake/Output this shift:     General appearance: alert, cooperative and no distress Resp: clear to auscultation bilaterally  Incision: healing well, Drains all serous  Lab Results:  No results found for this basename: WBC:2,HGB:2,HCT:2,PLT:2 in the last 72 hours BMET No results found for this basename: NA:2,K:2,CL:2,CO2:2,GLUCOSE:2,BUN:2,CREATININE:2,CALCIUM:2 in the last 72 hours PT/INR No results found for this basename: LABPROT:2,INR:2 in the last 72 hours ABG No results found for this basename: PHART:2,PCO2:2,PO2:2,HCO3:2 in the last 72 hours  MEDS, Scheduled    . anastrozole  1 mg Oral Q supper  . DULoxetine  60 mg Oral QPM  . oxybutynin  5 mg Oral BID  . pantoprazole  40 mg Oral Q1200  . pregabalin  100 mg Oral BID  . spironolactone  25 mg Oral BID  . vancomycin  1,250 mg Intravenous Q12H  . vancomycin  1,500 mg Intravenous 120 min pre-op    Studies/Results: No results found.  Assessment: s/p Procedure(s): TOTAL MASTECTOMY Patient Active Problem List  Diagnosis  . Mobitz (type) II atrioventricular block  . Breast cancer, right breast  . Lymphedema of breast    Stable, but poor pain control so far. Has been on chronic pain meds so may need additional meds  Plan: Ambulate more, increase diet, can d/c monitor   LOS: 1 day     Currie Paris, MD, [facility],  [place] [phone]   10/21/2011 7:08 AM

## 2011-10-22 ENCOUNTER — Encounter (INDEPENDENT_AMBULATORY_CARE_PROVIDER_SITE_OTHER): Payer: Self-pay | Admitting: Surgery

## 2011-10-22 HISTORY — DX: Morbid (severe) obesity due to excess calories: E66.01

## 2011-10-22 MED ORDER — DIPHENHYDRAMINE HCL 25 MG PO CAPS
25.0000 mg | ORAL_CAPSULE | ORAL | Status: DC | PRN
  Administered 2011-10-22: 25 mg via ORAL
  Filled 2011-10-22: qty 1

## 2011-10-22 MED ORDER — HYDROCODONE-ACETAMINOPHEN 7.5-325 MG PO TABS
1.0000 | ORAL_TABLET | ORAL | Status: DC | PRN
  Administered 2011-10-22: 2 via ORAL
  Administered 2011-10-22: 1 via ORAL
  Administered 2011-10-22 – 2011-10-23 (×3): 2 via ORAL
  Filled 2011-10-22: qty 2
  Filled 2011-10-22: qty 1
  Filled 2011-10-22 (×3): qty 2

## 2011-10-22 MED ORDER — ZOLPIDEM TARTRATE 10 MG PO TABS
10.0000 mg | ORAL_TABLET | Freq: Every day | ORAL | Status: DC
  Administered 2011-10-22: 10 mg via ORAL
  Filled 2011-10-22: qty 1

## 2011-10-22 NOTE — Progress Notes (Signed)
2 Days Post-Op  Subjective: Feels a bit better today. Pain controlled with PO's but just barely. No nausea. Still feels weak and not ambulated much. Thinks she is not ready to go home.  Objective: Vital signs in last 24 hours: Temp:  [97.4 F (36.3 C)-98.6 F (37 C)] 97.4 F (36.3 C) (09/11 0225) Pulse Rate:  [70-86] 70  (09/11 0225) Resp:  [16-19] 19  (09/11 0225) BP: (107-112)/(69-74) 108/69 mmHg (09/11 0225) SpO2:  [95 %-100 %] 99 % (09/11 0225)   Intake/Output from previous day: 09/10 0701 - 09/11 0700 In: 1210 [P.O.:360; I.V.:850] Out: 1975 [Urine:1650; Drains:325] Intake/Output this shift: Total I/O In: -  Out: 600 [Urine:600]   General appearance: alert, cooperative, fatigued and no distress Resp: clear to auscultation bilaterally  Incision: healing well, JP's thin  Lab Results:  No results found for this basename: WBC:2,HGB:2,HCT:2,PLT:2 in the last 72 hours BMET No results found for this basename: NA:2,K:2,CL:2,CO2:2,GLUCOSE:2,BUN:2,CREATININE:2,CALCIUM:2 in the last 72 hours PT/INR No results found for this basename: LABPROT:2,INR:2 in the last 72 hours ABG No results found for this basename: PHART:2,PCO2:2,PO2:2,HCO3:2 in the last 72 hours  MEDS, Scheduled    . anastrozole  1 mg Oral Q supper  . DULoxetine  60 mg Oral QPM  . heparin subcutaneous  5,000 Units Subcutaneous Q8H  . oxybutynin  5 mg Oral BID  . pantoprazole  40 mg Oral Q1200  . pregabalin  100 mg Oral BID  . spironolactone  25 mg Oral BID  . vancomycin  1,250 mg Intravenous Q12H    Studies/Results: No results found. Path shows 16 cm area of cancer, margins negative, LN's +.  Assessment: s/p Procedure(s): TOTAL MASTECTOMY Patient Active Problem List  Diagnosis  . Mobitz (type) II atrioventricular block  . Breast cancer, right breast  . Lymphedema of breast     Plan: Plan for discharge tomorrow Will d/c IV as access difficult and try to manage pain with PO's. Discussed path  report with patient. Told her likely discharge tomorrow. Encouraged her to ambulate more   LOS: 2 days     Currie Paris, MD, [facility], [place] [phone]   10/22/2011 8:33 AM

## 2011-10-22 NOTE — Clinical Documentation Improvement (Signed)
BMI DOCUMENTATION CLARIFICATION QUERY  THIS DOCUMENT IS NOT A PERMANENT PART OF THE MEDICAL RECORD  TO RESPOND TO THE THIS QUERY, FOLLOW THE INSTRUCTIONS BELOW:  1. If needed, update documentation for the patient's encounter via the notes activity.  2. Access this query again and click edit on the In [employer].  3. After updating, or not, click F2 to complete all highlighted (required) fields concerning your review. Select "additional documentation in the medical record" OR "no additional documentation provided".  4. Click Sign note button.  5. The deficiency will fall out of your In Basket *Please let us know if you are not able to complete this workflow by phone or e-mail (listed below).         10/22/11  Dear Dr. Jamey Ripa, Charm Barges  In an effort to better capture your patient's severity of illness, reflect appropriate length of stay and utilization of resources, a review of the patient medical record has revealed the following indicators.    Based on your clinical judgment, please clarify and document in a progress note and/or discharge summary the clinical condition associated with the following supporting information:  In responding to this query please exercise your independent judgment.  The fact that a query is asked, does not imply that any particular answer is desired or expected.  Pt admitted with breast ca.  Pt's BMI=  44.5                     .    Please clarify whether or not BMI can be linked to one of the diagnoses listed below and document in pn  and d/c. Thank You!  [facility]:   When linking BMI to a diagnosis please document both BMI and diagnosis together in pn for accuracy of severity of illness (SOI) and risk of mortality (ROM).   Document all secondary diagnoses that are monitored, evaluated, treated, or increases the LOS.    Possible Clinical conditions  Morbid Obesity W/ BMI=   Underweight w/BMI=  Other  condition___________________  Cannot Clinically determine _____________  Risk Factors: Sign & Symptoms: Weight: 266 lbs Height: 5'5" BMI= 44.26  Diagnostics: Lab:  Treatment: Monitored Regular diet    Reviewed: additional documentation in the medical record  Thank You,  Enis Slipper  RN, BSN, MSN/Inf, CCDS Clinical Documentation Specialist [facility] Dept Pager: [phone] / E-mail: [email or web address] Health Information Management [place]

## 2011-10-23 ENCOUNTER — Encounter (INDEPENDENT_AMBULATORY_CARE_PROVIDER_SITE_OTHER): Payer: Self-pay

## 2011-10-23 ENCOUNTER — Other Ambulatory Visit (INDEPENDENT_AMBULATORY_CARE_PROVIDER_SITE_OTHER): Payer: Self-pay

## 2011-10-23 ENCOUNTER — Telehealth (INDEPENDENT_AMBULATORY_CARE_PROVIDER_SITE_OTHER): Payer: Self-pay | Admitting: General Surgery

## 2011-10-23 ENCOUNTER — Encounter (INDEPENDENT_AMBULATORY_CARE_PROVIDER_SITE_OTHER): Payer: Self-pay | Admitting: Family Medicine

## 2011-10-23 MED ORDER — HYDROMORPHONE HCL 2 MG PO TABS: 2.0000 mg | ORAL_TABLET | ORAL | Status: DC | PRN

## 2011-10-23 NOTE — Discharge Summary (Signed)
Patient ID: Ashlee Moore [id number] 51 y.o. 1960/08/04  10/20/2011  Discharge date and time: No discharge date for patient encounter.  Admitting Physician: Currie Paris  Discharge Physician: Currie Paris  Admission Diagnoses: RIGHT BREAST CANCER  Discharge Diagnoses: Carcinoma Right breast; morbid obesity; Hx Mobitz A-V Block  Operations: Procedure(s): Bilateral total mastectomy with lymph node dissection on right    Discharged Condition: good    Hospital Course: Patient underwent surgery as noted and stayed for post op management, pain control etc. Basically did well, and tol diet with pain controlled on po meds at discharge  Consults: None  Significant Diagnostic Studies: Path: atient Name: Ashlee Moore, Ashlee Moore Accession #: [id number] DOB: Aug 02, 1960 Age: 51 Gender: F Client Name [facility] Collected Date: 10/20/2011 Received Date: 10/20/2011 Physician: Cyndia Bent Chart #: MRN # : [id number] Physician cc: Kaylyn Lim, RN Race:W Visit #: [id number] REPORT OF SURGICAL PATHOLOGY FINAL DIAGNOSIS Diagnosis 1. 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 - ANGIOLYMPHATIC INVASION AND PERINEURAL INVASION PRESENT. - RESECTION MARGINS, NEGATIVE FOR ATYPIA OR MALIGNANCY. - PLEASE SEE ONCOLOGY TEMPLATE FOR DETAILS. 2. Breast, simple mastectomy, prophylactic, left - EXTENSIVE FIBROCYSTIC CHANGES, NO EVIDENCE OF ATYPIA OR MALIGNANCY. - RESECTION MARGIN, NEGATIVE FOR ATYPIA OR MALIGNANCY. 3. Lymph node, biopsy, right axillary - TWO OF FOUR LYMPH NODES, POSITIVE FOR METASTATIC CARCINOMA WITH EXTRACAPSULAR EXTENSION (2/4). Microscopic Comment 1. BREAST, INVASIVE TUMOR, WITHOUT LYMPH NODE SAMPLING Specimen, including laterality: Right breast. Procedure: Simple mastectomy. Grade: II. Tubule formation: 3. Nuclear  pleomorphism: 2. Mitotic: 2. Tumor size (gross measurement): 19.2 cm. Margins: Negative. Invasive, distance to closest margin: Deep margin: 0.5 cm. In-situ, distance to closest margin: Deep margin:> 1 cm. Angiolymphatic invasion: Present. Ductal carcinoma in situ: Present. Grade: Intermediate grade. Extensive intraductal component: No. Lobular neoplasia: N/A. 1 of 3 FINAL for Ashlee Moore, Ashlee Moore [phone]) Microscopic Comment(continued) Tumor focality: Multiple satellite tumor nodules including in the dermis Treatment effect: Present, breast tissue only. Extent of tumor: Skin: Dermal involvement. Nipple: No. Skeletal muscle: N/A. Breast prognostic profile: Will be performed and addendum report will follow. Non-neoplastic breast: N/A. TNM: y mpT4b,y pN1a. Comments: Grossly there are two fibrotic areas identified, measuring 7.0 cm and 3.2 cm. These two areas are 9 cm apart. Sections of these two lesions and the fibrotic tissue in between show invasive ductal carcinoma with associated extensive angiolymphatic invasion. Thus the final measurement of the tumor is 19.2 cm. In addition, multiple separate satellite nodules are identified at the periphery of the tumor and in the dermis. Extensive angiolymphatic invasion is present. Two of four sampled axillary lymph nodes are positive for metastatic carcinoma with extracapsular extension. The tumor is 0.5 cm from the inked deep margin. Clinical correlation is recommended. Dr. Colonel Bald agrees. (HCL:eps 10/21/11) Abigail Miyamoto MD Pathologist, Electronic Signature (Case signed 10/21/2011) Specimen Gross and Clinical Information Specimen(s) Obtained: 1. Breast, simple mastectomy, right 2. Breast, simple mastectomy, prophylactic, left 3. Lymph node, biopsy, right axillary Specimen Clinical Information 1. right breast cancer, s/p lumpectomy and chemo for right breast upper outer quadrant [jl] Gross 1. Specimen: Right simple mastectomy, short  suture at superior, long suture at lateral. Also in the container is a 13 x 9 x 3 cm aggregate of irregular pieces of unremarkable fibrofatty tissue. Specimen integrity (intact/disrupted): The mastectomy portion is intact. Weight: The entire specimen is 2,676 grams. Size: 27 x 26 x 9 cm. Skin: There is  a 24 x 20 cm tan to hyperemic skin with a central 5 cm in diameter unremarkable nipple and surrounding areola. Located 4 cm lateral to the nipple is a 1 cm umbilicated area of skin. No other skin lesions are identified. Tumor/cavity: Within the superior mid to superior lateral specimen is a 7 x 6 x 4.5 cm area of gray white indurated and focally dense fibrous-like tissue with several embedded metallic staples. This area of tissue is 4.3 cm from the deep margin. Also found within the superior medial specimen is a 3.2 x 2.8 x 2 cm ill defined gray white to hyperemic firm mass which is 5 cm from the deep margin, and 9 cm from the area of indurated, dense tissue within the superior mid to superior lateral specimen. Uninvolved parenchyma: The remaining specimen consists predominantly of soft fatty tissue with a moderate amount of gray white diffusely nodular fibrous tissue. Prognostic indicators: Not taken at time of gross. Lymph nodes: Not included. Block summary: A - C = sections of dense tissue containing staples, superior mid to superior lateral D, E = deep margin nearest dense tissue containing staples F, G = sections of mass, superior medial H = deep margin nearest mass, superior medial I, J = tissue between area of dense tissue with staples and mass 2 of 3 FINAL for Ashlee Moore, Ashlee Moore ([id number]) Gross(continued) K = nodular tissue, inferior lateral, with nearest deep margin L = nodular tissue, inferior medial, with nearest deep margin M = section through umbilicated skin Total: 13 blocks 2. Specimen: Left simple mastectomy, short suture at superior, long suture at lateral. Specimen  integrity (intact/disrupted): Intact. Weight: 2,576 grams. Size: 30 x 30 x 7 cm . Skin: There is a 25 x 16 cm tan skin with a central 5 cm in diameter unremarkable nipple and surrounding areola. There are no skin defects. Tumor/cavity: None. Uninvolved parenchyma: Predominantly soft fatty tissue with moderate gray white fibrous tissue with scattered nodularity. Prognostic indicators: N/A. Lymph nodes: Not included. Block summary: A, B = superior lateral C, D = inferior lateral E, F = superior medial G, H = inferior medial Total: eight blocks 3. Received in formalin are four irregular pieces of soft to focally indurated fibrofatty tissues, which range from 1.3 x 1.3 x 0.7 cm to 4.1 x 3.2 x 2.1 cm, and are 5.4 x 5 x 2.4 cm in aggregate. Within each tissue is found a possible lymph node (four total) which are 1 cm, 1.4cm, 1.5 cm, and 1.7 cm. The two largest nodes have gray white firm cut surfaces. The nodes are bisected and entirely submitted in four blocks, A-D. (SSW:caf 10/20/11) Stain(s) used in Diagnosis: The following stain(s) were used in diagnosing the case: ER-ACIS, PR-ACIS, CISH. The control(s) stained appropriately. Disclaimer PR progesterone receptor (PgR 636), immunohistochemical stains are performed on formalin fixed, paraffin embedded tissue using a 3,3"-diaminobenzidine (DAB) chromogen and [employer] Autostainer System. The staining intensity of the nucleus is scored morphometrically using the Automated Cellular Imaging System (ACIS) and is reported as the percentage of tumor cell nuclei demonstrating specific nuclear staining. Estrogen receptor (SP1), immunohistochemical stains are performed on formalin fixed, paraffin embedded tissue using a 3,3"-diaminobenzidine (DAB) chromogen and [employer]AK[employer] Autostainer System. The staining intensity of the nucleus is scored morphometrically using the Automated Cellular Imaging System (ACIS) and is reported as the percentage of tumor cell nuclei  demonstrating specific nuclear staining. Her2Neu by CISH (chromogenic in-situ hybridization) is performed at [facility], using the Her2CISH pharmDx Kit (code number [id number])  Report signed out from the following location(s) [facility] [address], [place], [place] [address]. CLIA #: [id number],    Disposition: Home

## 2011-10-23 NOTE — Progress Notes (Signed)
3 Days Post-Op  Subjective: Doesn't feel as well, but ambulating, tolerating diet and pain controlled with po meds although would like something stronger at discharge  Objective: Vital signs in last 24 hours: Temp:  [98.2 F (36.8 C)-98.6 F (37 C)] 98.2 F (36.8 C) (09/12 0609) Pulse Rate:  [77-84] 77  (09/12 0609) Resp:  [18-20] 18  (09/12 0609) BP: (109-129)/(58-69) 129/58 mmHg (09/12 0609) SpO2:  [94 %-98 %] 94 % (09/12 0609) Weight:  [260 lb 2.3 oz (118 kg)] 260 lb 2.3 oz (118 kg) (09/11 1954)   Intake/Output from previous day: 09/11 0701 - 09/12 0700 In: 840 [P.O.:840] Out: 3090 [Urine:2800; Drains:290] Intake/Output this shift:     General appearance: alert, cooperative and no distress Resp: clear to auscultation bilaterally  Incision: Dressing dry, JP's thin drainage  Lab Results:  No results found for this basename: WBC:2,HGB:2,HCT:2,PLT:2 in the last 72 hours BMET No results found for this basename: NA:2,K:2,CL:2,CO2:2,GLUCOSE:2,BUN:2,CREATININE:2,CALCIUM:2 in the last 72 hours PT/INR No results found for this basename: LABPROT:2,INR:2 in the last 72 hours ABG No results found for this basename: PHART:2,PCO2:2,PO2:2,HCO3:2 in the last 72 hours  MEDS, Scheduled    . anastrozole  1 mg Oral Q supper  . DULoxetine  60 mg Oral QPM  . heparin subcutaneous  5,000 Units Subcutaneous Q8H  . oxybutynin  5 mg Oral BID  . pantoprazole  40 mg Oral Q1200  . pregabalin  100 mg Oral BID  . spironolactone  25 mg Oral BID  . zolpidem  10 mg Oral QHS    Studies/Results: No results found.  Assessment: s/p Procedure(s): TOTAL MASTECTOMY Patient Active Problem List  Diagnosis  . Mobitz (type) II atrioventricular block  . Breast cancer, right breast  . Morbid obesity with body mass index of 40.0-44.9 in adult    Able to be discharged today with plans for follow up in my office next week.  Plan: Discharge   LOS: 3 days     Currie Paris, MD,  [facility], [place] [phone]   10/23/2011 9:07 AM

## 2011-10-23 NOTE — Telephone Encounter (Signed)
LMOM for patient to call back.

## 2011-10-23 NOTE — Telephone Encounter (Signed)
Pt called to report she presented Rx for Dilaudid to [facility] that was not signed.  Called Rudie Meyer, pharmacist, who verifies this and is hold the script.  Dr. Jamey Ripa paged and dictated script for a partner in clinic to sign.  Called pharmacist back and he will issue enough meds to cover until he receives signed prescription.  Dr. Andrey Campanile signed Rx; mailed to attn:  Pharmacist, [facility], [address] [place], [place].  Pt is  Aware.

## 2011-10-23 NOTE — Plan of Care (Signed)
Problem: Phase I Progression Outcomes Goal: Pain controlled with appropriate interventions Outcome: Adequate for Discharge Pain controlled well with PO pain meds.

## 2011-10-23 NOTE — Telephone Encounter (Signed)
Message copied by Liliana Cline on Thu Oct 23, 2011  9:44 AM ------      Message from: Currie Paris      Created: Thu Oct 23, 2011  9:09 AM       I discharged her today - bilateral mastectomy a few days ago. Needs to be seen next week for wound check and drain check

## 2011-10-27 ENCOUNTER — Ambulatory Visit (INDEPENDENT_AMBULATORY_CARE_PROVIDER_SITE_OTHER): Payer: Medicare Other | Admitting: General Surgery

## 2011-10-27 ENCOUNTER — Encounter (INDEPENDENT_AMBULATORY_CARE_PROVIDER_SITE_OTHER): Payer: Self-pay | Admitting: General Surgery

## 2011-10-27 VITALS — BP 104/58 | HR 72 | Temp 97.8°F | Resp 16 | Ht 64.0 in | Wt 266.0 lb

## 2011-10-27 DIAGNOSIS — C50919 Malignant neoplasm of unspecified site of unspecified female breast: Secondary | ICD-10-CM

## 2011-10-27 DIAGNOSIS — C50911 Malignant neoplasm of unspecified site of right female breast: Secondary | ICD-10-CM

## 2011-10-27 NOTE — Patient Instructions (Signed)
The pain in your left mastectomy wound is due to the surgery. There is no sign of any bleeding or infection.  You had been given a prescription for Percocet for pain and that should help.  Be sure to keep your appointment in our office on Wednesday, September 18 as scheduled.  Be sure to keep  a written record of the drainage and bring it to the office with you.

## 2011-10-27 NOTE — Progress Notes (Signed)
Patient ID: Ashlee Moore, female   DOB: 18-Sep-1960, 51 y.o.   MRN: [id number] History: This patient presented to the office today complaining of severe pain in her left mastectomy wound. She states it is about the same as it was when she went home. On September 9 she underwent bilateral total mastectomy and right axillary mass excision by Dr. Jilda Panda. She has extensive disease within the breast and two out of four lymph nodes positive. She is aware of this.  Exam: Patient has a lot of anxiety and tearfulness . Vital signs stable. No physical distress Bilateral mastectomy incisions are examined. The dressing is completely removed and it is  completely redressed. The incisions are intact. There is no hematoma or infection. No unusual tenderness. The wound was redressed.  Assessment: Locally advanced breast cancer, right breast, status post bilateral mastectomy Severe left mastectomy wound pain. Actually this is improved since discharge but still causing a lot of anxiety and distress. No evidence of infection or bleeding  Plan: The patient was reassured and the wound was redressed. Percocet 7.7/325  #3- Rx given. Wound care discussed. Drain care discussed. She will see J She has an appointment to see Dr. Hurley Cisco in [place].   Angelia Mould. Derrell Lolling, M.D., [facility] General and Minimally invasive Surgery Breast and Colorectal Surgery Office:   [phone] Pager:   [phone]

## 2011-10-28 ENCOUNTER — Telehealth (INDEPENDENT_AMBULATORY_CARE_PROVIDER_SITE_OTHER): Payer: Self-pay | Admitting: General Surgery

## 2011-10-28 NOTE — Telephone Encounter (Signed)
Pt called to report her drain had come out; she has appt tomorrow.  Pt instructed to cleanse are with soap and water, pat dry and cover with DSD.  Site may drain additionally as the hole closes over.  She understands.

## 2011-10-29 ENCOUNTER — Encounter (INDEPENDENT_AMBULATORY_CARE_PROVIDER_SITE_OTHER): Payer: Self-pay

## 2011-10-29 ENCOUNTER — Ambulatory Visit (INDEPENDENT_AMBULATORY_CARE_PROVIDER_SITE_OTHER): Payer: Medicare Other | Admitting: General Surgery

## 2011-10-29 ENCOUNTER — Encounter (INDEPENDENT_AMBULATORY_CARE_PROVIDER_SITE_OTHER): Payer: Self-pay | Admitting: General Surgery

## 2011-10-29 VITALS — BP 118/74 | HR 89 | Temp 97.2°F | Resp 18 | Ht 64.0 in | Wt 256.0 lb

## 2011-10-29 DIAGNOSIS — C50911 Malignant neoplasm of unspecified site of right female breast: Secondary | ICD-10-CM

## 2011-10-29 DIAGNOSIS — C50919 Malignant neoplasm of unspecified site of unspecified female breast: Secondary | ICD-10-CM

## 2011-10-29 DIAGNOSIS — Z9889 Other specified postprocedural states: Secondary | ICD-10-CM

## 2011-10-29 NOTE — Progress Notes (Signed)
Subjective:     Patient ID: TYMIA BUCHE, female   DOB: Jun 19, 1960, 51 y.o.   MRN: [id number]  HPI The patient is a 51 year old female who underwent bilateral mastectomy on 10/20/2011 by Dr. Jamey Ripa.  Patient was seen yesterday secondary to increased bilateral breast pain. Subsequent to this patient had a left-sided medial drain fall out. Patient presents today with her record book but which reveals drainage ranging from 5-20 cc. Drain #3 which fell out had approximately 5-10 cc recorded over the past several days.  Review of Systems 12 point review of systems negative    Objective:   Physical Exam Bilateral mastectomy wounds clean dry and intact. JP drains with serosanguineous output.    Assessment:     This is a 51 year old female with bilateral mastectomies.    Plan:     1. Patient is to continue with according drains output at this time and followup with Dr. Jamey Ripa.

## 2011-10-30 ENCOUNTER — Encounter: Payer: Self-pay | Admitting: Internal Medicine

## 2011-10-30 ENCOUNTER — Ambulatory Visit (INDEPENDENT_AMBULATORY_CARE_PROVIDER_SITE_OTHER): Payer: Medicare Other | Admitting: Internal Medicine

## 2011-10-30 ENCOUNTER — Encounter: Payer: Medicare Other | Admitting: Internal Medicine

## 2011-10-30 VITALS — BP 128/60 | HR 102 | Ht 64.0 in | Wt 256.0 lb

## 2011-10-30 DIAGNOSIS — F411 Generalized anxiety disorder: Secondary | ICD-10-CM

## 2011-10-30 DIAGNOSIS — I441 Atrioventricular block, second degree: Secondary | ICD-10-CM

## 2011-10-30 DIAGNOSIS — Z170 Estrogen receptor positive status [ER+]: Secondary | ICD-10-CM

## 2011-10-30 DIAGNOSIS — G893 Neoplasm related pain (acute) (chronic): Secondary | ICD-10-CM

## 2011-10-30 DIAGNOSIS — C50919 Malignant neoplasm of unspecified site of unspecified female breast: Secondary | ICD-10-CM

## 2011-10-30 LAB — PACEMAKER DEVICE OBSERVATION
AL AMPLITUDE: 0.8 mv
BATTERY VOLTAGE: 3 V
RV LEAD AMPLITUDE: 3.5 mv
RV LEAD IMPEDENCE PM: 456 Ohm
VENTRICULAR PACING PM: 3.1

## 2011-10-30 NOTE — Progress Notes (Signed)
PCP: Louie Boston, MD  [name] is a 51 y.o. female who presents today for routine electrophysiology followup.  Since last being seen in our clinic, she has been diagnosed and treated for recurrent breast cancer.  She is recovering from bilateral mastectomy and still has dressings/ drains in place.  Today, she denies symptoms of palpitations, chest pain, shortness of breath,  lower extremity edema, dizziness, presyncope, or syncope.  The patient is otherwise without complaint today.   Past Medical History  Diagnosis Date  . Bipolar disorder   . Fibromyalgia   . Anemia   . Elevated LFTs     hx  . Arthritis   . Osteoporosis   . Chills   . Fever   . Generalized headaches   . Weakness   . Easy bruising   . Pacemaker   . GERD (gastroesophageal reflux disease)   . Sleep apnea     STOP BANG SCORE 4  . AV block, Mobitz 2 11/14/09    s/p MDT Revo PPM by JA  . Cancer     right breast/LAST CHEOM 10/07/11  . Morbid obesity with body mass index of 40.0-44.9 in adult 10/22/2011    BMI 44.26     Past Surgical History  Procedure Date  . Ectopic pregnancy surgery     x2  . Bilateral knee arthroscopy   . Gastric bypass surgery   . Plastic surgery on face     for ptosis of rt. eyelid  . Left ovary and fallopian tube removed due to chronic pain   . Ppm 11/14/09    MDT Revo implanted by Dr Johney Frame for syncope/ Mobitz II AV block  . Pacemaker insertion 2011  . Cholecystectomy   . Breast surgery 2012    lumpectomy  . Portacath placement     power port - right  . Mastectomy 2013    bilateral    Current Outpatient Prescriptions  Medication Sig Dispense Refill  . anastrozole (ARIMIDEX) 1 MG tablet Take 1 mg by mouth daily with supper.       . clonazePAM (KLONOPIN) 1 MG tablet Take 1 mg by mouth daily as needed. Anxiety      . cyclobenzaprine (FLEXERIL) 10 MG tablet Take 10 mg by mouth at bedtime as needed. Muscle spasm      . diclofenac (VOLTAREN) 75 MG EC tablet Take 75 mg by mouth  Twice daily.       . DULoxetine (CYMBALTA) 60 MG capsule Take 60 mg by mouth every evening.       Marland Kitchen omeprazole (PRILOSEC) 20 MG capsule Take 20 mg by mouth as needed.       Marland Kitchen oxybutynin (DITROPAN) 5 MG tablet Take 5 mg by mouth 2 (two) times daily.      Marland Kitchen oxyCODONE-acetaminophen (PERCOCET) 7.5-325 MG per tablet Take 1 tablet by mouth every 4 (four) hours as needed.      . pregabalin (LYRICA) 100 MG capsule Take 100 mg by mouth 2 (two) times daily.      . promethazine (PHENERGAN) 25 MG tablet Take 25 mg by mouth as needed. Nausea and vomiting      . spironolactone (ALDACTONE) 25 MG tablet Take 25 mg by mouth 2 (two) times daily.      Marland Kitchen zolpidem (AMBIEN) 10 MG tablet Take 10 mg by mouth at bedtime. sleep        Physical Exam: Filed Vitals:   10/30/11 0955  BP: 128/60  Pulse: 102  Height: 5'  4" (1.626 m)  Weight: 256 lb (116.121 kg)  SpO2: 96%    GEN- The patient is ill appearing, alert and oriented x 3 today.   Head- normocephalic, atraumatic Eyes-  Sclera clear, conjunctiva pink Ears- hearing intact Oropharynx- clear Lungs- Clear to ausculation bilaterally, normal work of breathing Chest- pacemaker pocket is well healed,  Dressing/ drains in place from recent bilateral mastectomy Heart- Regular rate and rhythm, no murmurs, rubs or gallops, PMI not laterally displaced GI- soft, NT, ND, + BS Extremities- no clubbing, cyanosis, or edema  Pacemaker interrogation- reviewed in detail today,  See PACEART report  Assessment and Plan:  1. Second degree AV block with syncope- resolved s/p PPM  Normal pacemaker function See Pace Art report No changes today  Return to the device clinic in 6 months

## 2011-10-30 NOTE — Patient Instructions (Addendum)
Your physician wants you to follow-up in: 6 months with [facility] device clinic and 12 months with Dr Johney Frame.  You will receive a reminder letter in the mail two months in advance. If you don't receive a letter, please call our office to schedule the follow-up appointment.  Your physician recommends that you continue on your current medications as directed. Please refer to the Current Medication list given to you today.

## 2011-11-07 ENCOUNTER — Encounter (INDEPENDENT_AMBULATORY_CARE_PROVIDER_SITE_OTHER): Payer: Self-pay | Admitting: Surgery

## 2011-11-07 ENCOUNTER — Ambulatory Visit (INDEPENDENT_AMBULATORY_CARE_PROVIDER_SITE_OTHER): Payer: Medicare Other | Admitting: Surgery

## 2011-11-07 VITALS — BP 122/80 | HR 88 | Temp 98.7°F | Resp 22 | Ht 64.0 in | Wt 252.0 lb

## 2011-11-07 DIAGNOSIS — Z09 Encounter for follow-up examination after completed treatment for conditions other than malignant neoplasm: Secondary | ICD-10-CM

## 2011-11-07 NOTE — Patient Instructions (Signed)
See me again next week. You can shower starting tomorrow.

## 2011-11-07 NOTE — Progress Notes (Signed)
Ashlee Moore    [id number] 11/07/2011    05-30-1960   CC: Post op Bilateral mastectomy  HPI: The patient returns for post op follow-up. She underwent a Bilateral TM with removal lymph nodes on right on 10/20/11. Over all she feels that she is doing well.some pain but this seems to be improving. She knows of the remaining left sided drain has fallen out. She is almost no drainage from either of the 2 right-sided drains.   PE: VITAL SIGNS: BP 122/80  Pulse 88  Temp 98.7 F (37.1 C) (Temporal)  Resp 22  Ht 5\' 4"  (1.626 m)  Wt 252 lb (114.306 kg)  BMI 43.26 kg/m2  The incision is healing nicely and there is no evidence of infection or hematoma.  The drains are ready to be removed. DATA REVIEWED: Pathology report showed extensive Residual right breast cancer. No cancer on the left side  IMPRESSION: Patient doing well. Drains can come out today.  PLAN: Her next visit will be in one week I removed the 2 remaining right-sided drains. I will see her next week for followup. She is scheduled to see her medical oncologist next week as well.

## 2011-11-11 ENCOUNTER — Encounter (INDEPENDENT_AMBULATORY_CARE_PROVIDER_SITE_OTHER): Payer: Self-pay | Admitting: Surgery

## 2011-11-11 ENCOUNTER — Encounter (INDEPENDENT_AMBULATORY_CARE_PROVIDER_SITE_OTHER): Payer: Self-pay | Admitting: General Surgery

## 2011-11-11 ENCOUNTER — Ambulatory Visit (INDEPENDENT_AMBULATORY_CARE_PROVIDER_SITE_OTHER): Payer: Medicare Other | Admitting: Surgery

## 2011-11-11 VITALS — BP 132/88 | HR 80 | Temp 97.3°F | Resp 20 | Ht 64.0 in | Wt 248.6 lb

## 2011-11-11 DIAGNOSIS — C50919 Malignant neoplasm of unspecified site of unspecified female breast: Secondary | ICD-10-CM

## 2011-11-11 DIAGNOSIS — C50911 Malignant neoplasm of unspecified site of right female breast: Secondary | ICD-10-CM

## 2011-11-11 MED ORDER — DOXYCYCLINE HYCLATE 100 MG PO TABS: 100.0000 mg | ORAL_TABLET | Freq: Two times a day (BID) | ORAL | Status: DC

## 2011-11-11 NOTE — Patient Instructions (Signed)
Start antibiotic today. Come to see me again on Friday

## 2011-11-11 NOTE — Progress Notes (Signed)
Patient returns for followup visit post bilateral mastectomies. Her drains removed last visit. She is concerned that she's developed fluid. She also continues to have discomfort.  Exam: Vital signs:BP 132/88  Pulse 80  Temp 97.3 F (36.3 C) (Temporal)  Resp 20  Ht 5\' 4"  (1.626 m)  Wt 248 lb 9.6 oz (112.764 kg)  BMI 42.67 kg/m2 Breasts: There is bilateral fluid present. I've aspirated 60 cc on the right and 30 on the left. Appears clear. There is no obvious cellulitis.  Impression: Postop bilateral seromas  Plan:we will start her on some antibiotics because of concern about the potential for infection developing in the seromas. In addition, I will see her in 3 days for recheck and probable re aspiration.

## 2011-11-14 ENCOUNTER — Encounter (INDEPENDENT_AMBULATORY_CARE_PROVIDER_SITE_OTHER): Payer: Self-pay | Admitting: Surgery

## 2011-11-14 ENCOUNTER — Ambulatory Visit (INDEPENDENT_AMBULATORY_CARE_PROVIDER_SITE_OTHER): Payer: Medicare Other | Admitting: Surgery

## 2011-11-14 VITALS — BP 118/62 | HR 68 | Temp 98.3°F | Resp 20 | Ht 64.0 in | Wt 251.0 lb

## 2011-11-14 DIAGNOSIS — Z09 Encounter for follow-up examination after completed treatment for conditions other than malignant neoplasm: Secondary | ICD-10-CM

## 2011-11-14 NOTE — Progress Notes (Signed)
Patient returns for followup visit post bilateral mastectomies. She had fluiud aspirated last time bilaterlly and started antibiotics as I was concerned about the potential for infection. She feels better today with less pain Exam: Vital signs:BP 118/62  Pulse 68  Temp 98.3 F (36.8 C) (Temporal)  Resp 20  Ht 5\' 4"  (1.626 m)  Wt 251 lb (113.853 kg)  BMI 43.08 kg/m2 Breasts: No fluid today on left, but aspirated 80cc on right. Not red so no apparent infection Impression: Postop seroma  Plan:Will have her seen next week. She has oncology f/u next week. Reviewed her path again

## 2011-11-17 ENCOUNTER — Encounter: Payer: Medicare Other | Admitting: Internal Medicine

## 2011-11-17 DIAGNOSIS — C50919 Malignant neoplasm of unspecified site of unspecified female breast: Secondary | ICD-10-CM

## 2011-11-17 DIAGNOSIS — G589 Mononeuropathy, unspecified: Secondary | ICD-10-CM

## 2011-11-21 ENCOUNTER — Encounter (INDEPENDENT_AMBULATORY_CARE_PROVIDER_SITE_OTHER): Payer: Self-pay | Admitting: Surgery

## 2011-11-21 ENCOUNTER — Ambulatory Visit (INDEPENDENT_AMBULATORY_CARE_PROVIDER_SITE_OTHER): Payer: Medicare Other | Admitting: Surgery

## 2011-11-21 VITALS — BP 126/68 | HR 84 | Temp 97.7°F | Resp 20 | Ht 64.0 in | Wt 249.0 lb

## 2011-11-21 DIAGNOSIS — C50919 Malignant neoplasm of unspecified site of unspecified female breast: Secondary | ICD-10-CM

## 2011-11-21 DIAGNOSIS — C50911 Malignant neoplasm of unspecified site of right female breast: Secondary | ICD-10-CM

## 2011-11-21 NOTE — Progress Notes (Signed)
This is a patient of Dr. Jamey Ripa who is status post bilateral mastectomies. He has been aspirating seromatous over the last couple of weeks. She comes in today for recheck while he is out of town. She is on antibiotics for mild erythema around her incisions. This seems to be slightly improved. I cannot really palpate any significant seroma but the patient insists that the right side has reaccumulated from last week. We prepped the skin with alcohol and anesthetized with 1% lidocaine. I attempted to aspirate any seroma in the middle part of her mastectomy site.  I was unable to aspirate anything. She has a followup appointment next week with Dr. Jamey Ripa. She also complains of some abdominal distention. She is not having bowel movements on regular basis I encouraged her to use a stool softener and when necessary MiraLAX.  Ashlee Moore. Ashlee Skains, MD, [facility]  11/21/2011 12:06 PM

## 2011-11-25 DIAGNOSIS — Z5112 Encounter for antineoplastic immunotherapy: Secondary | ICD-10-CM

## 2011-11-25 DIAGNOSIS — C50919 Malignant neoplasm of unspecified site of unspecified female breast: Secondary | ICD-10-CM

## 2011-11-27 ENCOUNTER — Ambulatory Visit (INDEPENDENT_AMBULATORY_CARE_PROVIDER_SITE_OTHER): Payer: Medicare Other | Admitting: Surgery

## 2011-11-27 ENCOUNTER — Encounter (INDEPENDENT_AMBULATORY_CARE_PROVIDER_SITE_OTHER): Payer: Self-pay | Admitting: Surgery

## 2011-11-27 VITALS — BP 123/81 | HR 70 | Temp 97.4°F | Resp 18 | Ht 64.0 in | Wt 248.8 lb

## 2011-11-27 DIAGNOSIS — Z9889 Other specified postprocedural states: Secondary | ICD-10-CM

## 2011-11-27 NOTE — Progress Notes (Signed)
Patient returns for followup visit post bilateral mastectomies. No fluid was noted on her last visit. She is having less pain. She's not noticed any redness or any suggestion of infection. Exam: Vital signs:BP 123/81  Pulse 70  Temp 97.4 F (36.3 C) (Temporal)  Resp 18  Ht 5\' 4"  (1.626 m)  Wt 248 lb 12.8 oz (112.855 kg)  BMI 42.71 kg/m2 Breastswounds appear to be healing fine. Somewhat limited range of motion of the left side. Right side looks fine. Plan:we'll see her back in about 3 weeks. She is going to do Herceptin for 12 weeks and start radiation therapy soon.

## 2011-11-28 ENCOUNTER — Telehealth: Payer: Self-pay | Admitting: *Deleted

## 2011-11-28 DIAGNOSIS — R079 Chest pain, unspecified: Secondary | ICD-10-CM

## 2011-11-28 NOTE — Telephone Encounter (Signed)
Patient called in tears stating she recently had surgery but is now having severe pain on the left side of her heart. Nurse advised patient that she needed to go to ED for evaluation. Patient verbalized understanding of plan.

## 2011-12-02 DIAGNOSIS — Z5112 Encounter for antineoplastic immunotherapy: Secondary | ICD-10-CM

## 2011-12-02 DIAGNOSIS — C50919 Malignant neoplasm of unspecified site of unspecified female breast: Secondary | ICD-10-CM

## 2011-12-05 DIAGNOSIS — I428 Other cardiomyopathies: Secondary | ICD-10-CM

## 2011-12-05 DIAGNOSIS — IMO0001 Reserved for inherently not codable concepts without codable children: Secondary | ICD-10-CM

## 2011-12-05 DIAGNOSIS — C50919 Malignant neoplasm of unspecified site of unspecified female breast: Secondary | ICD-10-CM

## 2011-12-05 DIAGNOSIS — M1990 Unspecified osteoarthritis, unspecified site: Secondary | ICD-10-CM

## 2011-12-09 ENCOUNTER — Encounter: Payer: Medicare Other | Admitting: Internal Medicine

## 2011-12-09 DIAGNOSIS — F329 Major depressive disorder, single episode, unspecified: Secondary | ICD-10-CM

## 2011-12-09 DIAGNOSIS — C50919 Malignant neoplasm of unspecified site of unspecified female breast: Secondary | ICD-10-CM

## 2011-12-09 DIAGNOSIS — Z5112 Encounter for antineoplastic immunotherapy: Secondary | ICD-10-CM

## 2011-12-16 DIAGNOSIS — Z5112 Encounter for antineoplastic immunotherapy: Secondary | ICD-10-CM

## 2011-12-16 DIAGNOSIS — C50919 Malignant neoplasm of unspecified site of unspecified female breast: Secondary | ICD-10-CM

## 2011-12-23 ENCOUNTER — Ambulatory Visit (INDEPENDENT_AMBULATORY_CARE_PROVIDER_SITE_OTHER): Payer: Medicare Other | Admitting: Surgery

## 2011-12-23 ENCOUNTER — Encounter (INDEPENDENT_AMBULATORY_CARE_PROVIDER_SITE_OTHER): Payer: Self-pay | Admitting: Surgery

## 2011-12-23 VITALS — BP 136/80 | HR 85 | Temp 97.6°F | Resp 18 | Ht 64.0 in | Wt 251.8 lb

## 2011-12-23 DIAGNOSIS — C50919 Malignant neoplasm of unspecified site of unspecified female breast: Secondary | ICD-10-CM

## 2011-12-23 DIAGNOSIS — Z5112 Encounter for antineoplastic immunotherapy: Secondary | ICD-10-CM

## 2011-12-23 NOTE — Patient Instructions (Signed)
See me again in three months 

## 2011-12-23 NOTE — Progress Notes (Signed)
Patient returns for followup visit post bilateral mastectomies. No fluid was noted on her last visit. She is having less pain. She's not noticed any redness or any suggestion of infection.She is starting radiation Exam: Vital signs:BP 136/80  Pulse 85  Temp 97.6 F (36.4 C) (Temporal)  Resp 18  Ht 5\' 4"  (1.626 m)  Wt 251 lb 12.8 oz (114.216 kg)  BMI 43.22 kg/m2 Breastswounds appear to be healing fine. Somewhat limited range of motion of the left side. Right side looks fine. Plan:we'll see her back in about 3 weeks. She is going to do Herceptin for 12 weeks  Will arragne some shoulder PT as she is still limited in ROM

## 2011-12-30 DIAGNOSIS — Z5112 Encounter for antineoplastic immunotherapy: Secondary | ICD-10-CM

## 2011-12-30 DIAGNOSIS — C50919 Malignant neoplasm of unspecified site of unspecified female breast: Secondary | ICD-10-CM

## 2012-01-06 ENCOUNTER — Encounter: Payer: Medicare Other | Admitting: Internal Medicine

## 2012-01-06 DIAGNOSIS — G8929 Other chronic pain: Secondary | ICD-10-CM

## 2012-01-06 DIAGNOSIS — Z5112 Encounter for antineoplastic immunotherapy: Secondary | ICD-10-CM

## 2012-01-06 DIAGNOSIS — C50919 Malignant neoplasm of unspecified site of unspecified female breast: Secondary | ICD-10-CM

## 2012-01-06 DIAGNOSIS — IMO0001 Reserved for inherently not codable concepts without codable children: Secondary | ICD-10-CM

## 2012-01-13 DIAGNOSIS — Z5112 Encounter for antineoplastic immunotherapy: Secondary | ICD-10-CM

## 2012-01-13 DIAGNOSIS — C50919 Malignant neoplasm of unspecified site of unspecified female breast: Secondary | ICD-10-CM

## 2012-01-20 DIAGNOSIS — Z5112 Encounter for antineoplastic immunotherapy: Secondary | ICD-10-CM

## 2012-01-20 DIAGNOSIS — C50919 Malignant neoplasm of unspecified site of unspecified female breast: Secondary | ICD-10-CM

## 2012-01-27 DIAGNOSIS — Z5112 Encounter for antineoplastic immunotherapy: Secondary | ICD-10-CM

## 2012-01-27 DIAGNOSIS — C50919 Malignant neoplasm of unspecified site of unspecified female breast: Secondary | ICD-10-CM

## 2012-02-03 DIAGNOSIS — Z5112 Encounter for antineoplastic immunotherapy: Secondary | ICD-10-CM

## 2012-02-03 DIAGNOSIS — Z170 Estrogen receptor positive status [ER+]: Secondary | ICD-10-CM

## 2012-02-03 DIAGNOSIS — C50919 Malignant neoplasm of unspecified site of unspecified female breast: Secondary | ICD-10-CM

## 2012-02-10 DIAGNOSIS — Z5112 Encounter for antineoplastic immunotherapy: Secondary | ICD-10-CM

## 2012-02-10 DIAGNOSIS — C50919 Malignant neoplasm of unspecified site of unspecified female breast: Secondary | ICD-10-CM

## 2012-02-16 ENCOUNTER — Encounter: Payer: Medicare Other | Admitting: Internal Medicine

## 2012-02-16 DIAGNOSIS — F329 Major depressive disorder, single episode, unspecified: Secondary | ICD-10-CM

## 2012-02-16 DIAGNOSIS — C50919 Malignant neoplasm of unspecified site of unspecified female breast: Secondary | ICD-10-CM

## 2012-02-17 DIAGNOSIS — C50919 Malignant neoplasm of unspecified site of unspecified female breast: Secondary | ICD-10-CM

## 2012-02-17 DIAGNOSIS — Z5112 Encounter for antineoplastic immunotherapy: Secondary | ICD-10-CM

## 2012-02-24 DIAGNOSIS — Z5112 Encounter for antineoplastic immunotherapy: Secondary | ICD-10-CM

## 2012-02-24 DIAGNOSIS — C50919 Malignant neoplasm of unspecified site of unspecified female breast: Secondary | ICD-10-CM

## 2012-03-17 ENCOUNTER — Other Ambulatory Visit (HOSPITAL_COMMUNITY): Payer: Self-pay | Admitting: Internal Medicine

## 2012-03-17 ENCOUNTER — Encounter: Payer: Medicare Other | Admitting: Internal Medicine

## 2012-03-17 DIAGNOSIS — C50919 Malignant neoplasm of unspecified site of unspecified female breast: Secondary | ICD-10-CM

## 2012-03-17 DIAGNOSIS — R0602 Shortness of breath: Secondary | ICD-10-CM

## 2012-03-17 DIAGNOSIS — Z5112 Encounter for antineoplastic immunotherapy: Secondary | ICD-10-CM

## 2012-03-17 DIAGNOSIS — D509 Iron deficiency anemia, unspecified: Secondary | ICD-10-CM

## 2012-03-29 ENCOUNTER — Ambulatory Visit (HOSPITAL_COMMUNITY)
Admission: RE | Admit: 2012-03-29 | Discharge: 2012-03-29 | Disposition: A | Discharge status: Home / Self Care | Payer: Medicare Other | Source: Ambulatory Visit | Attending: Internal Medicine | Admitting: Internal Medicine

## 2012-03-29 DIAGNOSIS — C50919 Malignant neoplasm of unspecified site of unspecified female breast: Secondary | ICD-10-CM

## 2012-03-29 MED ORDER — FLUDEOXYGLUCOSE F - 18 (FDG) INJECTION
21.1000 | Freq: Once | INTRAVENOUS | Status: AC | PRN
Start: 2012-03-29 — End: 2012-03-29
  Administered 2012-03-29: 21.1 via INTRAVENOUS

## 2012-04-02 ENCOUNTER — Encounter (INDEPENDENT_AMBULATORY_CARE_PROVIDER_SITE_OTHER): Payer: Self-pay | Admitting: Surgery

## 2012-04-02 ENCOUNTER — Ambulatory Visit (INDEPENDENT_AMBULATORY_CARE_PROVIDER_SITE_OTHER): Payer: Medicare Other | Admitting: Surgery

## 2012-04-02 VITALS — BP 112/66 | HR 72 | Temp 97.5°F | Resp 16 | Ht 64.5 in | Wt 250.0 lb

## 2012-04-02 DIAGNOSIS — Z853 Personal history of malignant neoplasm of breast: Secondary | ICD-10-CM

## 2012-04-02 NOTE — Patient Instructions (Addendum)
See me again in three or four months

## 2012-04-03 NOTE — Progress Notes (Signed)
NAME: Ashlee Moore       DOB: 03-May-1960           DATE: 04/02/2012       MRN: [id number]  CC:  Chief Complaint  Patient presents with  . Routine Post Op    Total mastectomy - bilateral 10/20/11    Ashlee Moore is a 52 y.o.Marland Kitchenfemale who presents for routine followup of her Right breast cancer  diagnosed in 2012 and treated with lumpectomy, then chemo, then MRM, then radition. She has no problems or concerns on either side other than a few areas on the scars that occasionally open up a bit.  PFSH: She has had no significant changes since the last visit here.  ROS: There have been no significant changes since the last visit here  EXAM:  VS: BP 112/66  Pulse 72  Temp(Src) 97.5 F (36.4 C) (Temporal)  Resp 16  Ht 5' 4.5" (1.638 m)  Wt 250 lb (113.399 kg)  BMI 42.27 kg/m2  General: The patient is alert, oriented, generally healthy appearing, NAD. Mood and affect are normal.  Breasts:  S/P bilateral mastectomy. Still has erythema of right mastectomy flaps, presumably from radiation. No evidence of mass  Lymphatics: She has no axillary or supraclavicular adenopathy on either side.  Extremities: Full ROM of the surgical side with no lymphedema noted.  Data Reviewed: No new data  Impression: Doing well, with no evidence of recurrent cancer or new cancer  Plan: Will see back in about 3-4 months

## 2012-04-06 ENCOUNTER — Encounter: Payer: Medicare Other | Admitting: Internal Medicine

## 2012-04-06 DIAGNOSIS — Z5112 Encounter for antineoplastic immunotherapy: Secondary | ICD-10-CM

## 2012-04-06 DIAGNOSIS — C50919 Malignant neoplasm of unspecified site of unspecified female breast: Secondary | ICD-10-CM

## 2012-04-15 DIAGNOSIS — D509 Iron deficiency anemia, unspecified: Secondary | ICD-10-CM

## 2012-04-15 DIAGNOSIS — C3490 Malignant neoplasm of unspecified part of unspecified bronchus or lung: Secondary | ICD-10-CM

## 2012-04-27 DIAGNOSIS — C50919 Malignant neoplasm of unspecified site of unspecified female breast: Secondary | ICD-10-CM

## 2012-04-27 DIAGNOSIS — D509 Iron deficiency anemia, unspecified: Secondary | ICD-10-CM

## 2012-04-27 DIAGNOSIS — Z5112 Encounter for antineoplastic immunotherapy: Secondary | ICD-10-CM

## 2012-05-07 ENCOUNTER — Encounter: Payer: Self-pay | Admitting: Internal Medicine

## 2012-05-07 ENCOUNTER — Other Ambulatory Visit: Payer: Self-pay | Admitting: Internal Medicine

## 2012-05-07 ENCOUNTER — Ambulatory Visit (INDEPENDENT_AMBULATORY_CARE_PROVIDER_SITE_OTHER): Payer: Medicaid Other | Admitting: *Deleted

## 2012-05-07 DIAGNOSIS — I441 Atrioventricular block, second degree: Secondary | ICD-10-CM

## 2012-05-07 LAB — PACEMAKER DEVICE OBSERVATION
ATRIAL PACING PM: 0.28
BAMS-0001: 170 {beats}/min
BATTERY VOLTAGE: 3 V
RV LEAD THRESHOLD: 1 V
VENTRICULAR PACING PM: 0.21

## 2012-05-07 NOTE — Progress Notes (Signed)
Dual chamber MRI pacemaker check in clinic. Normal pacemaker check in clinic. No episodes recorded since last check. ROV in 6 mths w/JA in [place].

## 2012-05-19 DIAGNOSIS — N318 Other neuromuscular dysfunction of bladder: Secondary | ICD-10-CM

## 2012-05-19 DIAGNOSIS — C50919 Malignant neoplasm of unspecified site of unspecified female breast: Secondary | ICD-10-CM

## 2012-05-19 DIAGNOSIS — D509 Iron deficiency anemia, unspecified: Secondary | ICD-10-CM

## 2012-05-19 DIAGNOSIS — E8779 Other fluid overload: Secondary | ICD-10-CM

## 2012-05-20 DIAGNOSIS — Z5112 Encounter for antineoplastic immunotherapy: Secondary | ICD-10-CM

## 2012-05-20 DIAGNOSIS — C50919 Malignant neoplasm of unspecified site of unspecified female breast: Secondary | ICD-10-CM

## 2012-05-24 ENCOUNTER — Encounter (HOSPITAL_COMMUNITY): Payer: Self-pay

## 2012-05-25 ENCOUNTER — Telehealth: Payer: Self-pay | Admitting: Internal Medicine

## 2012-05-25 NOTE — Telephone Encounter (Signed)
Patient was schedule to have MRI at [facility]. Dr Alvino Chapel scheduled the procedure but [facility] told her they are refusing due to her [facility] maker

## 2012-05-25 NOTE — Telephone Encounter (Signed)
[facility] will not do MRI even though she has a MRI compatible device.  We will fill out form and fax over to them or she can contact Dr Roy's office and have MRI scheduled at [facility]

## 2012-05-26 ENCOUNTER — Other Ambulatory Visit (HOSPITAL_COMMUNITY): Payer: Self-pay | Admitting: Neurosurgery

## 2012-05-26 DIAGNOSIS — M47816 Spondylosis without myelopathy or radiculopathy, lumbar region: Secondary | ICD-10-CM

## 2012-05-28 ENCOUNTER — Encounter (HOSPITAL_COMMUNITY): Payer: Self-pay

## 2012-05-28 ENCOUNTER — Ambulatory Visit (HOSPITAL_COMMUNITY)
Admission: RE | Admit: 2012-05-28 | Discharge: 2012-05-28 | Disposition: A | Discharge status: Home / Self Care | Payer: Medicare Other | Source: Ambulatory Visit | Attending: Neurosurgery | Admitting: Neurosurgery

## 2012-05-28 ENCOUNTER — Encounter (HOSPITAL_COMMUNITY)
Admission: RE | Admit: 2012-05-28 | Discharge: 2012-05-28 | Disposition: A | Discharge status: Home / Self Care | Payer: Medicare Other | Source: Ambulatory Visit | Attending: Oral Surgery | Admitting: Oral Surgery

## 2012-05-28 DIAGNOSIS — Z01812 Encounter for preprocedural laboratory examination: Secondary | ICD-10-CM | POA: Insufficient documentation

## 2012-05-28 DIAGNOSIS — M899 Disorder of bone, unspecified: Secondary | ICD-10-CM | POA: Insufficient documentation

## 2012-05-28 DIAGNOSIS — M47817 Spondylosis without myelopathy or radiculopathy, lumbosacral region: Secondary | ICD-10-CM | POA: Insufficient documentation

## 2012-05-28 DIAGNOSIS — Z853 Personal history of malignant neoplasm of breast: Secondary | ICD-10-CM | POA: Insufficient documentation

## 2012-05-28 DIAGNOSIS — M47816 Spondylosis without myelopathy or radiculopathy, lumbar region: Secondary | ICD-10-CM

## 2012-05-28 DIAGNOSIS — M949 Disorder of cartilage, unspecified: Secondary | ICD-10-CM | POA: Insufficient documentation

## 2012-05-28 HISTORY — DX: Essential (primary) hypertension: I10

## 2012-05-28 LAB — COMPREHENSIVE METABOLIC PANEL
AST: 20 U/L (ref 0–37)
Albumin: 3.3 g/dL — ABNORMAL LOW (ref 3.5–5.2)
Chloride: 98 mEq/L (ref 96–112)
Creatinine, Ser: 0.77 mg/dL (ref 0.50–1.10)
Sodium: 138 mEq/L (ref 135–145)
Total Bilirubin: 0.2 mg/dL — ABNORMAL LOW (ref 0.3–1.2)

## 2012-05-28 LAB — CBC
Hemoglobin: 13.2 g/dL (ref 12.0–15.0)
MCH: 27 pg (ref 26.0–34.0)
MCV: 81 fL (ref 78.0–100.0)
Platelets: 310 10*3/uL (ref 150–400)
RBC: 4.89 MIL/uL (ref 3.87–5.11)

## 2012-05-28 NOTE — Pre-Procedure Instructions (Addendum)
Ashlee Moore  05/28/2012   Your procedure is scheduled on:  06/04/12  Report to [facility] at 1000 AM.  Call this number if you have problems the morning of surgery: [phone]   Remember:   Do not eat food or drink liquids after midnight.   Take these medicines the morning of surgery with A SIP OF WATER: clonazepam,cymbalta,omeprazole, ditropan,pain med STOP voltaren 05/30/12   Do not wear jewelry, make-up or nail polish.  Do not wear lotions, powders, or perfumes. You may wear deodorant.  Do not shave 48 hours prior to surgery. Men may shave face and neck.  Do not bring valuables to the hospital.  Contacts, dentures or bridgework may not be worn into surgery.  Leave suitcase in the car. After surgery it may be brought to your room.  For patients admitted to the hospital, checkout time is 11:00 AM the day of  discharge.   Patients discharged the day of surgery will not be allowed to drive  home.  Name and phone number of your driver:   Special Instructions: Shower using CHG 2 nights before surgery and the night before surgery.  If you shower the day of surgery use CHG.  Use special wash - you have one bottle of CHG for all showers.  You should use approximately 1/3 of the bottle for each shower.   Please read over the following fact sheets that you were given: Pain Booklet, Coughing and Deep Breathing and Surgical Site Infection Prevention

## 2012-05-28 NOTE — Progress Notes (Addendum)
No orders office closed. Please call mon 05/31/12 and for hx Pacer order s faxed to dr allred, called dr allred for recent visit note ? Last week, any tests,office closed please call mon 05/31/12 cxr ,ekg 9/13

## 2012-05-31 ENCOUNTER — Other Ambulatory Visit (HOSPITAL_COMMUNITY): Payer: Self-pay | Admitting: Internal Medicine

## 2012-05-31 DIAGNOSIS — C50919 Malignant neoplasm of unspecified site of unspecified female breast: Secondary | ICD-10-CM

## 2012-05-31 NOTE — Progress Notes (Addendum)
Received faxed orders from Dr. Randa Evens office. Orders put in EPIC. Called Dr. Jenel Lucks office for notes/ekg, and re-faxed order sheet for Implanted cardiac Device Programming. Also, called Dr. Randa Evens office for history. Office staff said pt. Was coming in on 06/02/12 for appointment.

## 2012-06-02 NOTE — Telephone Encounter (Signed)
Spoke with Aurther Loft RN at [facility] they were wanting an office note from last week and patient was not seen last week.  She was seen in device clinic on 05/07/12

## 2012-06-02 NOTE — Telephone Encounter (Signed)
New Prob     Faxed over blank orders in regards to pacemaker. Requesting a copy of orders be faxed over to her. Fax number [phone].

## 2012-06-02 NOTE — H&P (Signed)
HISTORY AND PHYSICAL  Ashlee Moore is a 52 y.o. female patient with CC: Painful teeth. Wants dentures.  No diagnosis found.  Past Medical History  Diagnosis Date  . Bipolar disorder   . Fibromyalgia   . Anemia   . Elevated LFTs     hx  . Arthritis   . Osteoporosis   . Chills   . Fever   . Generalized headaches   . Weakness   . Easy bruising   . Pacemaker   . GERD (gastroesophageal reflux disease)   . Sleep apnea     STOP BANG SCORE 4  . Cancer     right breast/LAST CHEOM 10/07/11  . Morbid obesity with body mass index of 40.0-44.9 in adult 10/22/2011    BMI 44.26    . Hypertension   . AV block, Mobitz 2 11/14/09    s/p MDT Revo PPM by JA    No current facility-administered medications for this encounter.   Current Outpatient Prescriptions  Medication Sig Dispense Refill  . anastrozole (ARIMIDEX) 1 MG tablet Take 1 mg by mouth at bedtime.       . clonazePAM (KLONOPIN) 1 MG tablet Take 1 mg by mouth daily as needed for anxiety.       . cyclobenzaprine (FLEXERIL) 10 MG tablet Take 10 mg by mouth daily. Muscle spasm      . diclofenac (VOLTAREN) 75 MG EC tablet Take 75 mg by mouth 2 (two) times daily.       . DULoxetine (CYMBALTA) 60 MG capsule Take 60 mg by mouth every evening.       . fentaNYL (DURAGESIC - DOSED MCG/HR) 100 MCG/HR Place 1 patch onto the skin every 3 (three) days.      . furosemide (LASIX) 40 MG tablet Take 40 mg by mouth daily.      . hydroxypropyl methylcellulose (ISOPTO TEARS) 2.5 % ophthalmic solution Place 1 drop into both eyes 4 (four) times daily as needed (dry eyes).      Marland Kitchen lidocaine-prilocaine (EMLA) cream Apply 1 application topically as needed. For port (2.5%/2/5%)      . omeprazole (PRILOSEC) 20 MG capsule Take 20 mg by mouth daily as needed (acid reflux).       Marland Kitchen oxybutynin (DITROPAN) 5 MG tablet Take 5 mg by mouth 2 (two) times daily.      . Oxycodone HCl 10 MG TABS Take 20 mg by mouth every 3 (three) hours as needed (pain).      .  pregabalin (LYRICA) 100 MG capsule Take 100 mg by mouth 2 (two) times daily.      . promethazine (PHENERGAN) 25 MG tablet Take 25 mg by mouth every 8 (eight) hours as needed for nausea.       Marland Kitchen spironolactone (ALDACTONE) 25 MG tablet Take 25 mg by mouth 2 (two) times daily.      Marland Kitchen zolpidem (AMBIEN) 10 MG tablet Take 10 mg by mouth at bedtime.       . ALPRAZolam (XANAX) 0.5 MG tablet Take 0.5 mg by mouth at bedtime as needed for sleep.      Marland Kitchen triamterene-hydrochlorothiazide (DYAZIDE) 37.5-25 MG per capsule Take 1 capsule by mouth every morning.       Allergies  Allergen Reactions  . Penicillins Swelling  . Sulfonamide Derivatives Rash  . Tape Other (See Comments)    Adhesive tape- RASH, SWELLING   Active Problems:   * No active hospital problems. *  Vitals: There were no vitals  taken for this visit. Lab results:No results found for this or any previous visit (from the past 24 hour(s)). Radiology Results: No results found. General appearance: alert, cooperative, no distress and morbidly obese Head: Normocephalic, without obvious abnormality, atraumatic Eyes: negative Ears: normal TM's and external ear canals both ears Nose: Nares normal. Septum midline. Mucosa normal. No drainage or sinus tenderness. Throat: Decayed teeth #'s 4, 5, 8, 9, 11, 13, 14, 28 Neck: no adenopathy, supple, symmetrical, trachea midline and thyroid not enlarged, symmetric, no tenderness/mass/nodules Resp: clear to auscultation bilaterally Cardio: regular rate and rhythm, S1, S2 normal, no murmur, click, rub or gallop  Assessment: 51 YO WF Bipolar, chronic pain, Morbid Obesity, GERD with non-restorable teeth #'s 4, 5, 8, 9, 11, 13, 14, 28.  Plan: Extraction teeth #'s 4, 5, 8, 9, 11, 13, 14, 28. Alveoloplasty. General anesthesia. Day surgery.   Ashlee Moore 06/02/2012

## 2012-06-02 NOTE — Progress Notes (Signed)
Peri-operative Implanted Device Orders placed in chart.

## 2012-06-02 NOTE — Progress Notes (Signed)
Spoke with Ashlee Moore at Dr. Jenel Lucks office. Pt. Has not seen Dr Johney Frame since Sept, 2013. She was seen in device clinic in March of this year by the device rep. Those notes are in epic under "procedures".

## 2012-06-03 NOTE — Telephone Encounter (Signed)
Sharlet Salina faxed form back to hospital regarding device.  Patient is also going to let me know when MRI is rescheduled

## 2012-06-04 ENCOUNTER — Ambulatory Visit (HOSPITAL_COMMUNITY)
Admission: RE | Admit: 2012-06-04 | Discharge: 2012-06-04 | Disposition: A | Discharge status: Home / Self Care | Payer: Medicare Other | Source: Ambulatory Visit | Attending: Oral Surgery | Admitting: Oral Surgery

## 2012-06-04 ENCOUNTER — Encounter (HOSPITAL_COMMUNITY): Payer: Self-pay | Admitting: *Deleted

## 2012-06-04 ENCOUNTER — Encounter (HOSPITAL_COMMUNITY): Payer: Self-pay | Admitting: Anesthesiology

## 2012-06-04 ENCOUNTER — Encounter (HOSPITAL_COMMUNITY)
Admission: RE | Disposition: A | Discharge status: Home / Self Care | Payer: Self-pay | Source: Ambulatory Visit | Attending: Oral Surgery

## 2012-06-04 ENCOUNTER — Ambulatory Visit (HOSPITAL_COMMUNITY): Payer: Medicare Other | Admitting: Anesthesiology

## 2012-06-04 DIAGNOSIS — Z9221 Personal history of antineoplastic chemotherapy: Secondary | ICD-10-CM | POA: Insufficient documentation

## 2012-06-04 DIAGNOSIS — Z880 Allergy status to penicillin: Secondary | ICD-10-CM | POA: Insufficient documentation

## 2012-06-04 DIAGNOSIS — F411 Generalized anxiety disorder: Secondary | ICD-10-CM | POA: Insufficient documentation

## 2012-06-04 DIAGNOSIS — Z882 Allergy status to sulfonamides status: Secondary | ICD-10-CM | POA: Insufficient documentation

## 2012-06-04 DIAGNOSIS — G8929 Other chronic pain: Secondary | ICD-10-CM | POA: Insufficient documentation

## 2012-06-04 DIAGNOSIS — Z9109 Other allergy status, other than to drugs and biological substances: Secondary | ICD-10-CM | POA: Insufficient documentation

## 2012-06-04 DIAGNOSIS — IMO0001 Reserved for inherently not codable concepts without codable children: Secondary | ICD-10-CM | POA: Insufficient documentation

## 2012-06-04 DIAGNOSIS — I10 Essential (primary) hypertension: Secondary | ICD-10-CM | POA: Insufficient documentation

## 2012-06-04 DIAGNOSIS — Z6841 Body Mass Index (BMI) 40.0 and over, adult: Secondary | ICD-10-CM | POA: Insufficient documentation

## 2012-06-04 DIAGNOSIS — F319 Bipolar disorder, unspecified: Secondary | ICD-10-CM | POA: Insufficient documentation

## 2012-06-04 DIAGNOSIS — C50911 Malignant neoplasm of unspecified site of right female breast: Secondary | ICD-10-CM

## 2012-06-04 DIAGNOSIS — M129 Arthropathy, unspecified: Secondary | ICD-10-CM | POA: Insufficient documentation

## 2012-06-04 DIAGNOSIS — I441 Atrioventricular block, second degree: Secondary | ICD-10-CM | POA: Insufficient documentation

## 2012-06-04 DIAGNOSIS — K219 Gastro-esophageal reflux disease without esophagitis: Secondary | ICD-10-CM | POA: Insufficient documentation

## 2012-06-04 DIAGNOSIS — Z79899 Other long term (current) drug therapy: Secondary | ICD-10-CM | POA: Insufficient documentation

## 2012-06-04 DIAGNOSIS — M810 Age-related osteoporosis without current pathological fracture: Secondary | ICD-10-CM | POA: Insufficient documentation

## 2012-06-04 DIAGNOSIS — K029 Dental caries, unspecified: Secondary | ICD-10-CM | POA: Insufficient documentation

## 2012-06-04 DIAGNOSIS — Z791 Long term (current) use of non-steroidal anti-inflammatories (NSAID): Secondary | ICD-10-CM | POA: Insufficient documentation

## 2012-06-04 DIAGNOSIS — D649 Anemia, unspecified: Secondary | ICD-10-CM | POA: Insufficient documentation

## 2012-06-04 DIAGNOSIS — G4733 Obstructive sleep apnea (adult) (pediatric): Secondary | ICD-10-CM | POA: Insufficient documentation

## 2012-06-04 DIAGNOSIS — Z853 Personal history of malignant neoplasm of breast: Secondary | ICD-10-CM | POA: Insufficient documentation

## 2012-06-04 HISTORY — PX: MULTIPLE EXTRACTIONS WITH ALVEOLOPLASTY: SHX5342

## 2012-06-04 SURGERY — MULTIPLE EXTRACTION WITH ALVEOLOPLASTY
Anesthesia: General | Site: Mouth | Wound class: Clean Contaminated

## 2012-06-04 MED ORDER — ONDANSETRON HCL 4 MG/2ML IJ SOLN
INTRAMUSCULAR | Status: DC | PRN
  Administered 2012-06-04: 4 mg via INTRAVENOUS

## 2012-06-04 MED ORDER — HYDROMORPHONE HCL PF 1 MG/ML IJ SOLN
INTRAMUSCULAR | Status: AC
  Filled 2012-06-04: qty 1

## 2012-06-04 MED ORDER — OXYCODONE HCL 5 MG/5ML PO SOLN: 5.0000 mg | Freq: Once | ORAL | Status: AC | PRN

## 2012-06-04 MED ORDER — PROPOFOL 10 MG/ML IV BOLUS
INTRAVENOUS | Status: DC | PRN
  Administered 2012-06-04: 170 mg via INTRAVENOUS

## 2012-06-04 MED ORDER — HYDROMORPHONE HCL PF 1 MG/ML IJ SOLN
0.2500 mg | INTRAMUSCULAR | Status: DC | PRN
  Administered 2012-06-04 (×4): 0.5 mg via INTRAVENOUS

## 2012-06-04 MED ORDER — FENTANYL CITRATE 0.05 MG/ML IJ SOLN
INTRAMUSCULAR | Status: DC | PRN
  Administered 2012-06-04: 50 ug via INTRAVENOUS

## 2012-06-04 MED ORDER — SUCCINYLCHOLINE CHLORIDE 20 MG/ML IJ SOLN
INTRAMUSCULAR | Status: DC | PRN
  Administered 2012-06-04: 100 mg via INTRAVENOUS

## 2012-06-04 MED ORDER — PROMETHAZINE HCL 25 MG/ML IJ SOLN
INTRAMUSCULAR | Status: AC
  Filled 2012-06-04: qty 1

## 2012-06-04 MED ORDER — LACTATED RINGERS IV SOLN
INTRAVENOUS | Status: DC | PRN
  Administered 2012-06-04: 12:00:00 via INTRAVENOUS

## 2012-06-04 MED ORDER — CLINDAMYCIN PHOSPHATE 600 MG/50ML IV SOLN
600.0000 mg | Freq: Four times a day (QID) | INTRAVENOUS | Status: DC
  Administered 2012-06-04: 600 mg via INTRAVENOUS
  Filled 2012-06-04: qty 50

## 2012-06-04 MED ORDER — LIDOCAINE-EPINEPHRINE 2 %-1:100000 IJ SOLN
INTRAMUSCULAR | Status: DC | PRN
  Administered 2012-06-04: 20 mL

## 2012-06-04 MED ORDER — LIDOCAINE-EPINEPHRINE 2 %-1:100000 IJ SOLN
INTRAMUSCULAR | Status: AC
  Filled 2012-06-04: qty 1

## 2012-06-04 MED ORDER — OXYCODONE HCL 5 MG PO TABS
5.0000 mg | ORAL_TABLET | Freq: Once | ORAL | Status: AC | PRN
  Administered 2012-06-04: 5 mg via ORAL

## 2012-06-04 MED ORDER — OXYCODONE HCL 5 MG PO TABS
ORAL_TABLET | ORAL | Status: AC
  Filled 2012-06-04: qty 1

## 2012-06-04 MED ORDER — LACTATED RINGERS IV SOLN
INTRAVENOUS | Status: DC
  Administered 2012-06-04: 12:00:00 via INTRAVENOUS

## 2012-06-04 MED ORDER — SODIUM CHLORIDE 0.9 % IV SOLN: INTRAVENOUS | Status: DC

## 2012-06-04 MED ORDER — PROMETHAZINE HCL 25 MG/ML IJ SOLN
6.2500 mg | INTRAMUSCULAR | Status: DC | PRN
  Administered 2012-06-04: 6.25 mg via INTRAVENOUS

## 2012-06-04 MED ORDER — LIDOCAINE HCL (CARDIAC) 20 MG/ML IV SOLN
INTRAVENOUS | Status: DC | PRN
  Administered 2012-06-04: 50 mg via INTRAVENOUS

## 2012-06-04 MED ORDER — OXYCODONE-ACETAMINOPHEN 5-325 MG PO TABS: 1.0000 | ORAL_TABLET | ORAL | Status: DC | PRN

## 2012-06-04 MED ORDER — SODIUM CHLORIDE 0.9 % IR SOLN
Status: DC | PRN
  Administered 2012-06-04: 1000 mL

## 2012-06-04 MED ORDER — 0.9 % SODIUM CHLORIDE (POUR BTL) OPTIME
TOPICAL | Status: DC | PRN
  Administered 2012-06-04: 1000 mL

## 2012-06-04 SURGICAL SUPPLY — 27 items
BUR CROSS CUT FISSURE 1.6 (BURR) ×2 IMPLANT
BUR EGG ELITE 4.0 (BURR) IMPLANT
CANISTER SUCTION 2500CC (MISCELLANEOUS) ×2 IMPLANT
CLOTH BEACON ORANGE TIMEOUT ST (SAFETY) ×2 IMPLANT
COVER SURGICAL LIGHT HANDLE (MISCELLANEOUS) ×2 IMPLANT
CRADLE DONUT ADULT HEAD (MISCELLANEOUS) ×2 IMPLANT
DECANTER SPIKE VIAL GLASS SM (MISCELLANEOUS) ×2 IMPLANT
GAUZE PACKING FOLDED 2  STR (GAUZE/BANDAGES/DRESSINGS) ×1
GAUZE PACKING FOLDED 2 STR (GAUZE/BANDAGES/DRESSINGS) ×1 IMPLANT
GLOVE BIO SURGEON STRL SZ 6.5 (GLOVE) ×2 IMPLANT
GLOVE BIO SURGEON STRL SZ7.5 (GLOVE) ×2 IMPLANT
GLOVE BIOGEL PI IND STRL 7.0 (GLOVE) ×1 IMPLANT
GLOVE BIOGEL PI INDICATOR 7.0 (GLOVE) ×1
GOWN STRL NON-REIN LRG LVL3 (GOWN DISPOSABLE) ×2 IMPLANT
GOWN STRL REIN XL XLG (GOWN DISPOSABLE) ×2 IMPLANT
KIT BASIN OR (CUSTOM PROCEDURE TRAY) ×2 IMPLANT
KIT ROOM TURNOVER OR (KITS) ×2 IMPLANT
NEEDLE 22X1 1/2 (OR ONLY) (NEEDLE) ×2 IMPLANT
NS IRRIG 1000ML POUR BTL (IV SOLUTION) ×2 IMPLANT
PAD ARMBOARD 7.5X6 YLW CONV (MISCELLANEOUS) ×4 IMPLANT
SUT CHROMIC 3 0 PS 2 (SUTURE) ×2 IMPLANT
SYR CONTROL 10ML LL (SYRINGE) ×2 IMPLANT
TOWEL OR 17X26 10 PK STRL BLUE (TOWEL DISPOSABLE) ×2 IMPLANT
TRAY ENT MC OR (CUSTOM PROCEDURE TRAY) ×2 IMPLANT
TUBING IRRIGATION (MISCELLANEOUS) IMPLANT
WATER STERILE IRR 1000ML POUR (IV SOLUTION) IMPLANT
YANKAUER SUCT BULB TIP NO VENT (SUCTIONS) ×2 IMPLANT

## 2012-06-04 NOTE — Progress Notes (Signed)
Patient reports 10/10. Patient reports that she has not taken her normal medication today. Per PACU patient aware that d/t multiple medications that affect pain receptors that adequate pain control will be best archived by going home and taking normal home medications. Patient and family is agreeable to same. Patient notified to use ice pack and call MD if normal regimen and prescription were not working together.

## 2012-06-04 NOTE — Anesthesia Procedure Notes (Signed)
Procedure Name: Intubation Date/Time: 06/04/2012 12:01 PM Performed by: Gwenyth Allegra Pre-anesthesia Checklist: Suction available, Patient being monitored, Emergency Drugs available, Timeout performed and Patient identified Patient Re-evaluated:Patient Re-evaluated prior to inductionOxygen Delivery Method: Circle system utilized Preoxygenation: Pre-oxygenation with 100% oxygen Intubation Type: IV induction Ventilation: Mask ventilation without difficulty Laryngoscope Size: Mac and 3 Grade View: Grade I Tube type: Oral Tube size: 7.0 mm Number of attempts: 1 Airway Equipment and Method: Stylet Placement Confirmation: ETT inserted through vocal cords under direct vision,  breath sounds checked- equal and bilateral and positive ETCO2 Secured at: 21 cm Tube secured with: Tape Dental Injury: Teeth and Oropharynx as per pre-operative assessment

## 2012-06-04 NOTE — Anesthesia Preprocedure Evaluation (Signed)
Anesthesia Evaluation  Patient identified by MRN, date of birth, ID band Patient awake    Reviewed: Allergy & Precautions, H&P , NPO status , Patient's Chart, lab work & pertinent test results  Airway Mallampati: II TM Distance: >3 FB Neck ROM: Full    Dental  (+) Poor Dentition, Partial Upper and Partial Lower   Pulmonary neg sleep apnea,  breath sounds clear to auscultation  Pulmonary exam normal       Cardiovascular Exercise Tolerance: Poor hypertension, - CAD, - Past MI and - Cardiac Stents + dysrhythmias + pacemaker Rhythm:Regular Rate:Normal  Mobitz II AVNB, pacemaker for associated bradycardia   Neuro/Psych  Headaches, PSYCHIATRIC DISORDERS Bipolar Disorder    GI/Hepatic Neg liver ROS, GERD-  Medicated and Controlled,  Endo/Other  negative endocrine ROSMorbid obesity  Renal/GU negative Renal ROS     Musculoskeletal  (+) Fibromyalgia -  Abdominal (+) + obese,   Peds  Hematology   Anesthesia Other Findings   Reproductive/Obstetrics                           Anesthesia Physical Anesthesia Plan  ASA: III  Anesthesia Plan: General   Post-op Pain Management:    Induction: Intravenous  Airway Management Planned: Oral ETT  Additional Equipment:   Intra-op Plan:   Post-operative Plan: Extubation in OR  Informed Consent: I have reviewed the patients History and Physical, chart, labs and discussed the procedure including the risks, benefits and alternatives for the proposed anesthesia with the patient or authorized representative who has indicated his/her understanding and acceptance.     Plan Discussed with: CRNA and Surgeon  Anesthesia Plan Comments:         Anesthesia Quick Evaluation

## 2012-06-04 NOTE — Transfer of Care (Signed)
Immediate Anesthesia Transfer of Care Note  Patient: Ashlee Moore  Procedure(s) Performed: Procedure(s): MULTIPLE EXTRACION WITH ALVEOLOPLASTY EXTRACT: 4, 5, 8, 9, 10, 13, 14 ,28 (N/A)  Patient Location: PACU  Anesthesia Type:General  Level of Consciousness: awake and alert   Airway & Oxygen Therapy: Patient Spontanous Breathing and Patient connected to nasal cannula oxygen  Post-op Assessment: Report given to PACU RN and Post -op Vital signs reviewed and stable  Post vital signs: Reviewed and stable  Complications: No apparent anesthesia complications

## 2012-06-04 NOTE — Op Note (Signed)
06/04/2012  12:30 PM  PATIENT:  Ashlee Moore  52 y.o. female  PRE-OPERATIVE DIAGNOSIS:  DENTAL CARIES TEETH #'S 4, 5, 8, 9, 11, 13, 14 ,28  POST-OPERATIVE DIAGNOSIS:  SAME  PROCEDURE:  Procedure(s): MULTIPLE EXTRACION WITH ALVEOLOPLASTY EXTRACT: 4, 5, 8, 9, 11, 13, 14 ,28  SURGEON:  Surgeon(s): [name], DDS  ANESTHESIA:   local and general  EBL:  minimal  DRAINS: none   SPECIMEN:  No Specimen  COUNTS:  YES  PLAN OF CARE: Discharge to home after PACU  PATIENT DISPOSITION:  PACU - hemodynamically stable.   PROCEDURE DETAILS: Dictation # [id number]  [name], DMD 06/04/2012 12:30 PM

## 2012-06-04 NOTE — Anesthesia Postprocedure Evaluation (Signed)
  Anesthesia Post-op Note  Patient: Ashlee Moore  Procedure(s) Performed: Procedure(s): MULTIPLE EXTRACION WITH ALVEOLOPLASTY EXTRACT: 4, 5, 8, 9, 10, 13, 14 ,28 (N/A)  Patient Location: PACU  Anesthesia Type:General  Level of Consciousness: awake  Airway and Oxygen Therapy: Patient Spontanous Breathing  Post-op Pain: mild  Post-op Assessment: Post-op Vital signs reviewed, Patient's Cardiovascular Status Stable, Respiratory Function Stable, Patent Airway, No signs of Nausea or vomiting and Pain level controlled  Post-op Vital Signs: stable  Complications: No apparent anesthesia complications

## 2012-06-04 NOTE — H&P (Signed)
H&P documentation  -History and Physical Reviewed  -Patient has been re-examined  -No change in the plan of care  Ashlee Moore  

## 2012-06-04 NOTE — OR Nursing (Signed)
CRNA stayed with pt until PACU RN was available.

## 2012-06-04 NOTE — Preoperative (Signed)
Beta Blockers   Reason not to administer Beta Blockers:Not Applicable 

## 2012-06-05 NOTE — Op Note (Signed)
NAMEPARISS, Ashlee Moore               ACCOUNT NO.:  [id number]  MEDICAL RECORD NO.:  [id number]  LOCATION:  [facility]                         FACILITY:  [facility]  PHYSICIAN:  [name], M.D.  DATE OF BIRTH:  [birth year]  DATE OF PROCEDURE:  06/04/2012 DATE OF DISCHARGE:  06/04/2012                              OPERATIVE REPORT   PREOPERATIVE DIAGNOSIS:  Nonrestorable teeth #4, 5, 8, 9, 11, 13, 14, 28.  POSTOPERATIVE DIAGNOSIS:  Nonrestorable teeth #4, 5, 8, 9, 11, 13, 14, 28.  PROCEDURE:  Extraction of teeth #4, 5, 8, 9, 11, 13, 14, 28.  SURGEON:  [name], M.D.  ANESTHESIA:  General.  Dr. Gypsy Balsam attending, oral intubation.  INDICATIONS FOR PROCEDURE:  Ashlee Moore is a 52 year old female, who is referred to me by her general dentist for removal of all remaining teeth, so that she could undergo fabrication of upper and lower dentures.  PAST MEDICAL HISTORY:  Significant for bipolar, fibromyalgia, osteoporosis, GERD, sleep apnea, cancer, morbid obesity, hypertension, AV block Mobitz type 2.  She also has anxiety of her dental procedures and for this reason, it was recommended that general anesthesia be utilized for the surgery and the patient was scheduled for [facility], where she could be intubated and have airway protection.  PROCEDURE:  The patient was taken to the operating room, placed on the table in supine position.  General anesthesia was administered intravenously and an oral endotracheal tube was placed and marked.  The eyes were protected.  The patient was draped for the procedure.  Time- out was performed.  The posterior pharynx was suctioned and a throat packing was placed.  A 2% lidocaine 1:100,000 epinephrine was infiltrated in inferior alveolar block on the right side and in buccal and palatal infiltration around the maxillary teeth to be removed.  A total of 11 mL was utilized.  The bite block was placed on the right side of the mouth and a 15 blade was used to make a  full-thickness incision around teeth #14, 13 carrying anteriorly to tooth #11 and then to teeth #8 and 9 on the buccal and palatal aspects.  The periosteum was reflected and then the teeth were elevated and removed with the universal upper #150 forceps.  The sockets were curetted.  Gingival tissue was trimmed and the periosteum was reflected, so that alveoplasty could be performed.  This was done with the egg-shaped bur and a bone file.  Then, the area was irrigated and closed with 3-0 chromic.  The bite block was repositioned and then the endotracheal tube was placed on the other side of the mouth and a sweetheart retractor was used to retract the tongue.  Tooth #28 was removed using the Asch forceps.  The tissue was then trimmed with the Dean scissors and then a 15 blade was used to make an incision around teeth #4, 5 and carried anteriorly to the extraction socket with #8.  The periosteum was reflected with a periosteal elevator.  The teeth were elevated with a 301 elevator and removed from the mouth with a #150 forceps.  The sockets were irrigated and curetted.  The periosteum was reflected to expose the alveolar crest and  the egg-shaped bur and bone file were used to perform the alveoplasty.  The area was irrigated and closed with 3-0 chromic.  The oral cavity was inspected and irrigated and suctioned.  Throat pack was removed.  The patient was awakened and taken to the recovery in good condition.  Breathing spontaneously.     [name], M.D.     SMJ/MEDQ  D:  06/04/2012  T:  06/05/2012  Job:  [id number]

## 2012-06-07 ENCOUNTER — Encounter (HOSPITAL_COMMUNITY): Payer: Self-pay | Admitting: Oral Surgery

## 2012-06-08 ENCOUNTER — Telehealth (HOSPITAL_COMMUNITY): Payer: Self-pay | Admitting: Interventional Radiology

## 2012-06-08 ENCOUNTER — Other Ambulatory Visit (HOSPITAL_COMMUNITY): Payer: Self-pay | Admitting: Interventional Radiology

## 2012-06-08 DIAGNOSIS — M549 Dorsalgia, unspecified: Secondary | ICD-10-CM

## 2012-06-08 DIAGNOSIS — IMO0002 Reserved for concepts with insufficient information to code with codable children: Secondary | ICD-10-CM

## 2012-06-09 ENCOUNTER — Other Ambulatory Visit: Payer: Self-pay | Admitting: Radiology

## 2012-06-10 ENCOUNTER — Ambulatory Visit (HOSPITAL_COMMUNITY)
Admission: RE | Admit: 2012-06-10 | Discharge: 2012-06-10 | Disposition: A | Discharge status: Home / Self Care | Payer: Medicare Other | Source: Ambulatory Visit | Attending: Internal Medicine | Admitting: Internal Medicine

## 2012-06-10 ENCOUNTER — Other Ambulatory Visit (HOSPITAL_COMMUNITY): Payer: Self-pay | Admitting: Internal Medicine

## 2012-06-10 ENCOUNTER — Encounter: Payer: Medicare Other | Admitting: Internal Medicine

## 2012-06-10 DIAGNOSIS — C50919 Malignant neoplasm of unspecified site of unspecified female breast: Secondary | ICD-10-CM

## 2012-06-10 DIAGNOSIS — Z5112 Encounter for antineoplastic immunotherapy: Secondary | ICD-10-CM

## 2012-06-11 ENCOUNTER — Encounter (HOSPITAL_COMMUNITY): Payer: Self-pay

## 2012-06-11 ENCOUNTER — Ambulatory Visit (HOSPITAL_COMMUNITY)
Admission: RE | Admit: 2012-06-11 | Discharge: 2012-06-11 | Disposition: A | Discharge status: Home / Self Care | Payer: Medicare Other | Source: Ambulatory Visit | Attending: Internal Medicine | Admitting: Internal Medicine

## 2012-06-11 ENCOUNTER — Other Ambulatory Visit (HOSPITAL_COMMUNITY): Payer: Self-pay | Admitting: Interventional Radiology

## 2012-06-11 ENCOUNTER — Other Ambulatory Visit: Payer: Self-pay | Admitting: Radiology

## 2012-06-11 DIAGNOSIS — Z950 Presence of cardiac pacemaker: Secondary | ICD-10-CM | POA: Insufficient documentation

## 2012-06-11 DIAGNOSIS — K219 Gastro-esophageal reflux disease without esophagitis: Secondary | ICD-10-CM | POA: Insufficient documentation

## 2012-06-11 DIAGNOSIS — M549 Dorsalgia, unspecified: Secondary | ICD-10-CM

## 2012-06-11 DIAGNOSIS — M8448XA Pathological fracture, other site, initial encounter for fracture: Secondary | ICD-10-CM | POA: Insufficient documentation

## 2012-06-11 DIAGNOSIS — M810 Age-related osteoporosis without current pathological fracture: Secondary | ICD-10-CM | POA: Insufficient documentation

## 2012-06-11 DIAGNOSIS — C7952 Secondary malignant neoplasm of bone marrow: Secondary | ICD-10-CM | POA: Insufficient documentation

## 2012-06-11 DIAGNOSIS — IMO0002 Reserved for concepts with insufficient information to code with codable children: Secondary | ICD-10-CM

## 2012-06-11 DIAGNOSIS — D649 Anemia, unspecified: Secondary | ICD-10-CM | POA: Insufficient documentation

## 2012-06-11 DIAGNOSIS — Z882 Allergy status to sulfonamides status: Secondary | ICD-10-CM | POA: Insufficient documentation

## 2012-06-11 DIAGNOSIS — Z801 Family history of malignant neoplasm of trachea, bronchus and lung: Secondary | ICD-10-CM | POA: Insufficient documentation

## 2012-06-11 DIAGNOSIS — C7951 Secondary malignant neoplasm of bone: Secondary | ICD-10-CM | POA: Insufficient documentation

## 2012-06-11 DIAGNOSIS — Y92009 Unspecified place in unspecified non-institutional (private) residence as the place of occurrence of the external cause: Secondary | ICD-10-CM | POA: Insufficient documentation

## 2012-06-11 DIAGNOSIS — IMO0001 Reserved for inherently not codable concepts without codable children: Secondary | ICD-10-CM | POA: Insufficient documentation

## 2012-06-11 DIAGNOSIS — Z808 Family history of malignant neoplasm of other organs or systems: Secondary | ICD-10-CM | POA: Insufficient documentation

## 2012-06-11 DIAGNOSIS — S3210XA Unspecified fracture of sacrum, initial encounter for closed fracture: Secondary | ICD-10-CM | POA: Insufficient documentation

## 2012-06-11 DIAGNOSIS — Z6841 Body Mass Index (BMI) 40.0 and over, adult: Secondary | ICD-10-CM | POA: Insufficient documentation

## 2012-06-11 DIAGNOSIS — Z853 Personal history of malignant neoplasm of breast: Secondary | ICD-10-CM | POA: Insufficient documentation

## 2012-06-11 DIAGNOSIS — Z800 Family history of malignant neoplasm of digestive organs: Secondary | ICD-10-CM | POA: Insufficient documentation

## 2012-06-11 DIAGNOSIS — G4730 Sleep apnea, unspecified: Secondary | ICD-10-CM | POA: Insufficient documentation

## 2012-06-11 DIAGNOSIS — M129 Arthropathy, unspecified: Secondary | ICD-10-CM | POA: Insufficient documentation

## 2012-06-11 DIAGNOSIS — Z880 Allergy status to penicillin: Secondary | ICD-10-CM | POA: Insufficient documentation

## 2012-06-11 DIAGNOSIS — Z803 Family history of malignant neoplasm of breast: Secondary | ICD-10-CM | POA: Insufficient documentation

## 2012-06-11 DIAGNOSIS — Z87891 Personal history of nicotine dependence: Secondary | ICD-10-CM | POA: Insufficient documentation

## 2012-06-11 DIAGNOSIS — Z9884 Bariatric surgery status: Secondary | ICD-10-CM | POA: Insufficient documentation

## 2012-06-11 DIAGNOSIS — Z9109 Other allergy status, other than to drugs and biological substances: Secondary | ICD-10-CM | POA: Insufficient documentation

## 2012-06-11 DIAGNOSIS — F319 Bipolar disorder, unspecified: Secondary | ICD-10-CM | POA: Insufficient documentation

## 2012-06-11 DIAGNOSIS — Z9010 Acquired absence of unspecified breast and nipple: Secondary | ICD-10-CM | POA: Insufficient documentation

## 2012-06-11 DIAGNOSIS — W19XXXA Unspecified fall, initial encounter: Secondary | ICD-10-CM | POA: Insufficient documentation

## 2012-06-11 LAB — CBC WITH DIFFERENTIAL/PLATELET
Basophils Absolute: 0 10*3/uL (ref 0.0–0.1)
Basophils Relative: 0 % (ref 0–1)
Eosinophils Relative: 5 % (ref 0–5)
HCT: 37.7 % (ref 36.0–46.0)
Lymphocytes Relative: 16 % (ref 12–46)
MCHC: 34.2 g/dL (ref 30.0–36.0)
MCV: 80.6 fL (ref 78.0–100.0)
Monocytes Absolute: 0.7 10*3/uL (ref 0.1–1.0)
Neutro Abs: 4.3 10*3/uL (ref 1.7–7.7)
Platelets: 321 10*3/uL (ref 150–400)
RDW: 17.4 % — ABNORMAL HIGH (ref 11.5–15.5)
WBC: 6.3 10*3/uL (ref 4.0–10.5)

## 2012-06-11 LAB — PROTIME-INR: Prothrombin Time: 12.9 seconds (ref 11.6–15.2)

## 2012-06-11 LAB — BASIC METABOLIC PANEL
BUN: 8 mg/dL (ref 6–23)
Calcium: 9.6 mg/dL (ref 8.4–10.5)
Chloride: 101 mEq/L (ref 96–112)
Creatinine, Ser: 0.6 mg/dL (ref 0.50–1.10)
GFR calc Af Amer: >90 mL/min (ref >90)

## 2012-06-11 LAB — APTT: aPTT: 31 seconds (ref 24–37)

## 2012-06-11 MED ORDER — FENTANYL CITRATE 0.05 MG/ML IJ SOLN
INTRAMUSCULAR | Status: AC
  Filled 2012-06-11: qty 2

## 2012-06-11 MED ORDER — FENTANYL CITRATE 0.05 MG/ML IJ SOLN
INTRAMUSCULAR | Status: AC
  Filled 2012-06-11: qty 4

## 2012-06-11 MED ORDER — GELATIN ABSORBABLE 12-7 MM EX MISC
CUTANEOUS | Status: AC
  Filled 2012-06-11: qty 1

## 2012-06-11 MED ORDER — HYDROMORPHONE HCL PF 1 MG/ML IJ SOLN
INTRAMUSCULAR | Status: AC
  Filled 2012-06-11: qty 2

## 2012-06-11 MED ORDER — SODIUM CHLORIDE 0.9 % IV SOLN: INTRAVENOUS | Status: DC

## 2012-06-11 MED ORDER — SODIUM CHLORIDE 0.9 % IV SOLN: INTRAVENOUS | Status: AC

## 2012-06-11 MED ORDER — VANCOMYCIN HCL 10 G IV SOLR
1500.0000 mg | INTRAVENOUS | Status: AC
  Administered 2012-06-11: 1500 mg via INTRAVENOUS
  Filled 2012-06-11: qty 1500

## 2012-06-11 MED ORDER — MIDAZOLAM HCL 2 MG/2ML IJ SOLN
INTRAMUSCULAR | Status: AC | PRN
  Administered 2012-06-11 (×2): 0.5 mg via INTRAVENOUS
  Administered 2012-06-11 (×5): 1 mg via INTRAVENOUS

## 2012-06-11 MED ORDER — HYDROMORPHONE HCL PF 1 MG/ML IJ SOLN
INTRAMUSCULAR | Status: AC | PRN
  Administered 2012-06-11 (×3): 1 mg

## 2012-06-11 MED ORDER — MIDAZOLAM HCL 2 MG/2ML IJ SOLN
INTRAMUSCULAR | Status: AC
  Filled 2012-06-11: qty 4

## 2012-06-11 MED ORDER — MIDAZOLAM HCL 2 MG/2ML IJ SOLN
INTRAMUSCULAR | Status: AC
  Filled 2012-06-11: qty 2

## 2012-06-11 MED ORDER — SODIUM BICARBONATE 4 % IV SOLN
INTRAVENOUS | Status: AC
  Filled 2012-06-11: qty 5

## 2012-06-11 MED ORDER — IOHEXOL 300 MG/ML  SOLN
50.0000 mL | Freq: Once | INTRAMUSCULAR | Status: AC | PRN
  Administered 2012-06-11: 10 mL

## 2012-06-11 MED ORDER — FENTANYL CITRATE 0.05 MG/ML IJ SOLN
INTRAMUSCULAR | Status: AC | PRN
  Administered 2012-06-11 (×2): 25 ug via INTRAVENOUS
  Administered 2012-06-11: 50 ug via INTRAVENOUS
  Administered 2012-06-11: 25 ug via INTRAVENOUS
  Administered 2012-06-11 (×2): 12.5 ug via INTRAVENOUS
  Administered 2012-06-11 (×2): 25 ug via INTRAVENOUS

## 2012-06-11 NOTE — ED Notes (Signed)
Pt complains of pain 10 on 1-10 scale.  On going moderate sedation RX admin to control pain.  Explained this to pt

## 2012-06-11 NOTE — ED Notes (Signed)
Patient is resting comfortably, complains of feeling sore post procedure but not pain at this time.

## 2012-06-11 NOTE — ED Notes (Signed)
No S/S of pain and is resting comfortably, asleep.  VSS

## 2012-06-11 NOTE — Procedures (Signed)
S/P L5 core biopsy followed by KP. For pathological compression fracture S/P S1 biopsy followed by VP

## 2012-06-11 NOTE — ED Notes (Signed)
Patient identified as Ashlee Moore by Lucrezia Europe, [place], Wyvonnia Dusky

## 2012-06-11 NOTE — ED Notes (Signed)
Requested extended recovery bed on short stay.  Spoke with [employer]

## 2012-06-11 NOTE — ED Notes (Signed)
Family updated as to patient's status per MD at bedside

## 2012-06-11 NOTE — ED Notes (Signed)
Patient denies pain and is resting comfortably.  

## 2012-06-11 NOTE — H&P (Signed)
Ashlee Moore is an 52 y.o. female.   Chief Complaint: fell at home few days ago Back pain MRI reveals abn signal at Lumbar 5 (consistent with mets) and Sacral fracture Consulted with Dr Corliss Skains yesterday Scheduled now for Lumbar #5 tumor ablation with Kyphoplasty/Vertebroplasty and Sacroplasty HPI: Breast Ca; Bipolar; fibromyalgia; PAC; pacemaker; HTN; osteoporosis  Past Medical History  Diagnosis Date  . Bipolar disorder   . Fibromyalgia   . Anemia   . Elevated LFTs     hx  . Arthritis   . Osteoporosis   . Chills   . Fever   . Generalized headaches   . Weakness   . Easy bruising   . Pacemaker   . GERD (gastroesophageal reflux disease)   . Sleep apnea     STOP BANG SCORE 4  . Cancer     right breast/LAST CHEOM 10/07/11  . Morbid obesity with body mass index of 40.0-44.9 in adult 10/22/2011    BMI 44.26    . Hypertension   . AV block, Mobitz 2 11/14/09    s/p MDT Revo PPM by JA    Past Surgical History  Procedure Laterality Date  . Ectopic pregnancy surgery      x2  . Bilateral knee arthroscopy    . Gastric bypass surgery    . Plastic surgery on face      for ptosis of rt. eyelid  . Left ovary and fallopian tube removed due to chronic pain    . Ppm  11/14/09    MDT Revo implanted by Dr Johney Frame for syncope/ Mobitz II AV block  . Pacemaker insertion  2011  . Cholecystectomy    . Portacath placement      power port - right  . Mastectomy  2013    bilateral  . Breast surgery  2012,12    lumpectomy,mastectomy bil  . Multiple extractions with alveoloplasty N/A 06/04/2012    Procedure: MULTIPLE EXTRACION WITH ALVEOLOPLASTY EXTRACT: 4, 5, 8, 9, 10, 13, 14 ,28;  Surgeon: [name], DDS;  Location: [facility] OR;  Service: Oral Surgery;  Laterality: N/A;    Family History  Problem Relation Age of Onset  . Heart attack Father   . Breast cancer Sister   . Cancer Sister     brain  . Brain cancer Sister   . Cancer Brother     lung  . Cancer Other     colon   Social  History:  reports that she quit smoking about 27 years ago. Her smoking use included Cigarettes. She has a 2.4 pack-year smoking history. She has never used smokeless tobacco. She reports that she does not drink alcohol or use illicit drugs.  Allergies:  Allergies  Allergen Reactions  . Penicillins Swelling  . Sulfonamide Derivatives Rash  . Tape Other (See Comments)    Adhesive tape- RASH, SWELLING     (Not in a hospital admission)  Results for orders placed during the hospital encounter of 06/11/12 (from the past 48 hour(s))  CBC WITH DIFFERENTIAL     Status: Abnormal   Collection Time    06/11/12  8:09 AM      Result Value Range   WBC 6.3  4.0 - 10.5 K/uL   RBC 4.68  3.87 - 5.11 MIL/uL   Hemoglobin 12.9  12.0 - 15.0 g/dL   HCT 16.1  09.6 - 04.5 %   MCV 80.6  78.0 - 100.0 fL   MCH 27.6  26.0 - 34.0 pg  MCHC 34.2  30.0 - 36.0 g/dL   RDW 40.9 (*) 81.1 - 91.4 %   Platelets 321  150 - 400 K/uL   Neutrophils Relative 69  43 - 77 %   Neutro Abs 4.3  1.7 - 7.7 K/uL   Lymphocytes Relative 16  12 - 46 %   Lymphs Abs 1.0  0.7 - 4.0 K/uL   Monocytes Relative 11  3 - 12 %   Monocytes Absolute 0.7  0.1 - 1.0 K/uL   Eosinophils Relative 5  0 - 5 %   Eosinophils Absolute 0.3  0.0 - 0.7 K/uL   Basophils Relative 0  0 - 1 %   Basophils Absolute 0.0  0.0 - 0.1 K/uL   No results found.  Review of Systems  Constitutional: Negative for fever.  Respiratory: Negative for shortness of breath.   Cardiovascular: Negative for chest pain.  Gastrointestinal: Negative for nausea, vomiting and abdominal pain.  Genitourinary: Positive for urgency.  Musculoskeletal: Positive for back pain.       Pain to left leg  Neurological: Negative for dizziness and headaches.    Blood pressure 126/85, pulse 71, temperature 98 F (36.7 C), temperature source Oral, resp. rate 18, height 5\' 4"  (1.626 m), weight 240 lb (108.863 kg), SpO2 95.00%. Physical Exam  Constitutional: She is oriented to person,  place, and time. She appears well-developed.  Cardiovascular: Normal rate, regular rhythm and normal heart sounds.   No murmur heard. Respiratory: Effort normal and breath sounds normal. She has no wheezes.  B Mastectomies  GI: Soft. Bowel sounds are normal. She exhibits distension.  Musculoskeletal: Normal range of motion.  Uses walker/cane  Neurological: She is alert and oriented to person, place, and time.  Skin: Skin is dry.  Rt upper arm bruise from recent fall  Psychiatric: She has a normal mood and affect. Her behavior is normal. Judgment and thought content normal.     Assessment/Plan Hx Breast Ca Fell at home; back pain L5 mets and Sacral fx Scheduled for L5 tumor ablation with VP/KP and Sacroplasty Pt aware of procedure benefits and risks and agreeable to proceed Consent signed and in chart  Tayari Yankee A 06/11/2012, 8:58 AM

## 2012-06-11 NOTE — ED Notes (Signed)
H&P, ASA & Airway evaluated pre procedural per P. Carleene Mains, [place], reviewed. Pre procedure ordered lab results reviewed and WNL required for procedure.

## 2012-06-11 NOTE — ED Notes (Signed)
Pt complains of back pain at trauma location.  Rates 5 on 1-10 scale.  Says this is not new or changed following fall

## 2012-06-17 DIAGNOSIS — C50919 Malignant neoplasm of unspecified site of unspecified female breast: Secondary | ICD-10-CM

## 2012-06-17 DIAGNOSIS — D649 Anemia, unspecified: Secondary | ICD-10-CM

## 2012-06-17 DIAGNOSIS — C7952 Secondary malignant neoplasm of bone marrow: Secondary | ICD-10-CM

## 2012-06-17 DIAGNOSIS — C7951 Secondary malignant neoplasm of bone: Secondary | ICD-10-CM

## 2012-06-21 DIAGNOSIS — Z09 Encounter for follow-up examination after completed treatment for conditions other than malignant neoplasm: Secondary | ICD-10-CM

## 2012-06-25 ENCOUNTER — Other Ambulatory Visit (HOSPITAL_COMMUNITY): Payer: Self-pay | Admitting: Interventional Radiology

## 2012-06-25 DIAGNOSIS — IMO0002 Reserved for concepts with insufficient information to code with codable children: Secondary | ICD-10-CM

## 2012-06-30 ENCOUNTER — Ambulatory Visit (HOSPITAL_COMMUNITY): Admission: RE | Admit: 2012-06-30 | Payer: Medicare Other | Source: Ambulatory Visit

## 2012-07-01 DIAGNOSIS — Z5112 Encounter for antineoplastic immunotherapy: Secondary | ICD-10-CM

## 2012-07-01 DIAGNOSIS — C7951 Secondary malignant neoplasm of bone: Secondary | ICD-10-CM

## 2012-07-01 DIAGNOSIS — C7952 Secondary malignant neoplasm of bone marrow: Secondary | ICD-10-CM

## 2012-07-01 DIAGNOSIS — C50919 Malignant neoplasm of unspecified site of unspecified female breast: Secondary | ICD-10-CM

## 2012-07-06 ENCOUNTER — Ambulatory Visit (INDEPENDENT_AMBULATORY_CARE_PROVIDER_SITE_OTHER): Payer: Medicare Other | Admitting: Surgery

## 2012-07-06 ENCOUNTER — Telehealth (HOSPITAL_COMMUNITY): Payer: Self-pay | Admitting: Interventional Radiology

## 2012-07-22 ENCOUNTER — Encounter: Payer: Medicare Other | Admitting: Internal Medicine

## 2012-07-22 DIAGNOSIS — D509 Iron deficiency anemia, unspecified: Secondary | ICD-10-CM

## 2012-07-22 DIAGNOSIS — Z5112 Encounter for antineoplastic immunotherapy: Secondary | ICD-10-CM

## 2012-07-22 DIAGNOSIS — C50919 Malignant neoplasm of unspecified site of unspecified female breast: Secondary | ICD-10-CM

## 2012-07-22 DIAGNOSIS — C7951 Secondary malignant neoplasm of bone: Secondary | ICD-10-CM

## 2012-07-22 DIAGNOSIS — C7952 Secondary malignant neoplasm of bone marrow: Secondary | ICD-10-CM

## 2012-07-26 DIAGNOSIS — C7951 Secondary malignant neoplasm of bone: Secondary | ICD-10-CM

## 2012-07-26 DIAGNOSIS — C50919 Malignant neoplasm of unspecified site of unspecified female breast: Secondary | ICD-10-CM

## 2012-07-26 DIAGNOSIS — C7952 Secondary malignant neoplasm of bone marrow: Secondary | ICD-10-CM

## 2012-08-03 ENCOUNTER — Telehealth (HOSPITAL_COMMUNITY): Payer: Self-pay | Admitting: Interventional Radiology

## 2012-08-03 NOTE — Telephone Encounter (Signed)
Called pt to schedule L5 VP, pt states she will have to call me back to schedule when she can arrange for transportation. JMichaux

## 2012-08-04 ENCOUNTER — Other Ambulatory Visit: Payer: Self-pay | Admitting: Radiology

## 2012-08-04 ENCOUNTER — Other Ambulatory Visit (HOSPITAL_COMMUNITY): Payer: Self-pay | Admitting: Interventional Radiology

## 2012-08-04 DIAGNOSIS — C7951 Secondary malignant neoplasm of bone: Secondary | ICD-10-CM

## 2012-08-04 DIAGNOSIS — M549 Dorsalgia, unspecified: Secondary | ICD-10-CM

## 2012-08-05 ENCOUNTER — Encounter (HOSPITAL_COMMUNITY): Payer: Self-pay | Admitting: Pharmacy Technician

## 2012-08-06 ENCOUNTER — Other Ambulatory Visit (HOSPITAL_COMMUNITY): Payer: Self-pay | Admitting: Interventional Radiology

## 2012-08-06 ENCOUNTER — Ambulatory Visit (HOSPITAL_COMMUNITY)
Admission: RE | Admit: 2012-08-06 | Discharge: 2012-08-06 | Disposition: A | Discharge status: Home / Self Care | Payer: Medicare Other | Source: Ambulatory Visit | Attending: Interventional Radiology | Admitting: Interventional Radiology

## 2012-08-06 ENCOUNTER — Encounter (HOSPITAL_COMMUNITY): Payer: Self-pay

## 2012-08-06 DIAGNOSIS — Z6841 Body Mass Index (BMI) 40.0 and over, adult: Secondary | ICD-10-CM | POA: Insufficient documentation

## 2012-08-06 DIAGNOSIS — I491 Atrial premature depolarization: Secondary | ICD-10-CM | POA: Insufficient documentation

## 2012-08-06 DIAGNOSIS — IMO0001 Reserved for inherently not codable concepts without codable children: Secondary | ICD-10-CM | POA: Insufficient documentation

## 2012-08-06 DIAGNOSIS — Z79899 Other long term (current) drug therapy: Secondary | ICD-10-CM | POA: Insufficient documentation

## 2012-08-06 DIAGNOSIS — Z950 Presence of cardiac pacemaker: Secondary | ICD-10-CM | POA: Insufficient documentation

## 2012-08-06 DIAGNOSIS — M549 Dorsalgia, unspecified: Secondary | ICD-10-CM

## 2012-08-06 DIAGNOSIS — I441 Atrioventricular block, second degree: Secondary | ICD-10-CM | POA: Insufficient documentation

## 2012-08-06 DIAGNOSIS — C7951 Secondary malignant neoplasm of bone: Secondary | ICD-10-CM

## 2012-08-06 DIAGNOSIS — M810 Age-related osteoporosis without current pathological fracture: Secondary | ICD-10-CM | POA: Insufficient documentation

## 2012-08-06 DIAGNOSIS — F319 Bipolar disorder, unspecified: Secondary | ICD-10-CM | POA: Insufficient documentation

## 2012-08-06 DIAGNOSIS — C50919 Malignant neoplasm of unspecified site of unspecified female breast: Secondary | ICD-10-CM

## 2012-08-06 DIAGNOSIS — I10 Essential (primary) hypertension: Secondary | ICD-10-CM | POA: Insufficient documentation

## 2012-08-06 DIAGNOSIS — Z9109 Other allergy status, other than to drugs and biological substances: Secondary | ICD-10-CM | POA: Insufficient documentation

## 2012-08-06 DIAGNOSIS — Z87891 Personal history of nicotine dependence: Secondary | ICD-10-CM | POA: Insufficient documentation

## 2012-08-06 DIAGNOSIS — D649 Anemia, unspecified: Secondary | ICD-10-CM | POA: Insufficient documentation

## 2012-08-06 DIAGNOSIS — Z9010 Acquired absence of unspecified breast and nipple: Secondary | ICD-10-CM | POA: Insufficient documentation

## 2012-08-06 DIAGNOSIS — K219 Gastro-esophageal reflux disease without esophagitis: Secondary | ICD-10-CM | POA: Insufficient documentation

## 2012-08-06 DIAGNOSIS — Z882 Allergy status to sulfonamides status: Secondary | ICD-10-CM | POA: Insufficient documentation

## 2012-08-06 DIAGNOSIS — Z9181 History of falling: Secondary | ICD-10-CM | POA: Insufficient documentation

## 2012-08-06 DIAGNOSIS — Z853 Personal history of malignant neoplasm of breast: Secondary | ICD-10-CM | POA: Insufficient documentation

## 2012-08-06 DIAGNOSIS — Z880 Allergy status to penicillin: Secondary | ICD-10-CM | POA: Insufficient documentation

## 2012-08-06 DIAGNOSIS — M129 Arthropathy, unspecified: Secondary | ICD-10-CM | POA: Insufficient documentation

## 2012-08-06 DIAGNOSIS — G4730 Sleep apnea, unspecified: Secondary | ICD-10-CM | POA: Insufficient documentation

## 2012-08-06 LAB — PROTIME-INR
INR: 0.99 (ref 0.00–1.49)
Prothrombin Time: 12.9 seconds (ref 11.6–15.2)

## 2012-08-06 LAB — CBC WITH DIFFERENTIAL/PLATELET
Basophils Absolute: 0 10*3/uL (ref 0.0–0.1)
Lymphocytes Relative: 14 % (ref 12–46)
Lymphs Abs: 0.7 10*3/uL (ref 0.7–4.0)
Neutro Abs: 3.2 10*3/uL (ref 1.7–7.7)
Neutrophils Relative %: 65 % (ref 43–77)
Platelets: 303 10*3/uL (ref 150–400)
RBC: 4.66 MIL/uL (ref 3.87–5.11)
RDW: 15.2 % (ref 11.5–15.5)
WBC: 4.9 10*3/uL (ref 4.0–10.5)

## 2012-08-06 LAB — BASIC METABOLIC PANEL
CO2: 30 mEq/L (ref 19–32)
Calcium: 8.6 mg/dL (ref 8.4–10.5)
Creatinine, Ser: 0.63 mg/dL (ref 0.50–1.10)
GFR calc Af Amer: >90 mL/min (ref >90)
Sodium: 135 mEq/L (ref 135–145)

## 2012-08-06 LAB — APTT: aPTT: 33 seconds (ref 24–37)

## 2012-08-06 MED ORDER — VANCOMYCIN HCL IN DEXTROSE 1-5 GM/200ML-% IV SOLN
INTRAVENOUS | Status: AC
  Administered 2012-08-06: 1 g via INTRAVENOUS
  Filled 2012-08-06: qty 200

## 2012-08-06 MED ORDER — FENTANYL CITRATE 0.05 MG/ML IJ SOLN
INTRAMUSCULAR | Status: DC | PRN
  Administered 2012-08-06 (×3): 25 ug via INTRAVENOUS

## 2012-08-06 MED ORDER — MIDAZOLAM HCL 2 MG/2ML IJ SOLN
INTRAMUSCULAR | Status: AC
  Filled 2012-08-06: qty 6

## 2012-08-06 MED ORDER — VANCOMYCIN HCL IN DEXTROSE 1-5 GM/200ML-% IV SOLN: 1000.0000 mg | Freq: Once | INTRAVENOUS | Status: DC

## 2012-08-06 MED ORDER — IOHEXOL 300 MG/ML  SOLN
50.0000 mL | Freq: Once | INTRAMUSCULAR | Status: AC | PRN
  Administered 2012-08-06: 1 mL via INTRAVENOUS

## 2012-08-06 MED ORDER — GELATIN ABSORBABLE 12-7 MM EX MISC
CUTANEOUS | Status: AC
  Filled 2012-08-06: qty 1

## 2012-08-06 MED ORDER — TOBRAMYCIN SULFATE 1.2 G IJ SOLR
INTRAMUSCULAR | Status: AC
  Filled 2012-08-06: qty 1.2

## 2012-08-06 MED ORDER — HYDROMORPHONE HCL PF 1 MG/ML IJ SOLN
INTRAMUSCULAR | Status: DC | PRN
  Administered 2012-08-06: 1 mg

## 2012-08-06 MED ORDER — MIDAZOLAM HCL 2 MG/2ML IJ SOLN
INTRAMUSCULAR | Status: DC | PRN
  Administered 2012-08-06 (×3): 1 mg via INTRAVENOUS

## 2012-08-06 MED ORDER — FENTANYL CITRATE 0.05 MG/ML IJ SOLN
INTRAMUSCULAR | Status: AC
  Filled 2012-08-06: qty 4

## 2012-08-06 MED ORDER — SODIUM CHLORIDE 0.9 % IV SOLN
INTRAVENOUS | Status: DC
  Administered 2012-08-06: 10:00:00 via INTRAVENOUS

## 2012-08-06 MED ORDER — HYDROMORPHONE HCL PF 1 MG/ML IJ SOLN
INTRAMUSCULAR | Status: AC
  Filled 2012-08-06: qty 3

## 2012-08-06 NOTE — Procedures (Signed)
S/P L5 VP with biopsy Lt sided approach.

## 2012-08-06 NOTE — ED Notes (Signed)
Per Dr. Corliss Skains, NS 200cc bolus given

## 2012-08-06 NOTE — ED Notes (Signed)
Continues to deny pain at this time.  Dressing to back clean dry and intact

## 2012-08-06 NOTE — ED Notes (Signed)
NS 150cc bolus given per Dr. Corliss Skains

## 2012-08-06 NOTE — H&P (Signed)
Ashlee Moore is an 52 y.o. female.   Chief Complaint: persistent back pain MRI revealed abn signal at Lumbar 5 (consistent with mets) and Sacral fracture Underwent Lumbar #5 tumor ablation with Kyphoplasty/Vertebroplasty and Sacroplasty on 5/2 Bx of L5 was positive for cancer. Now with persistent pain in same location. Has not had repeat imaging. Pt here for repeat VP of L5 level. HPI: Breast Ca; Bipolar; fibromyalgia; PAC; pacemaker; HTN; osteoporosis  Past Medical History  Diagnosis Date  . Bipolar disorder   . Fibromyalgia   . Anemia   . Elevated LFTs     hx  . Arthritis   . Osteoporosis   . Chills   . Fever   . Generalized headaches   . Weakness   . Easy bruising   . Pacemaker   . GERD (gastroesophageal reflux disease)   . Sleep apnea     STOP BANG SCORE 4  . Cancer     right breast/LAST CHEOM 10/07/11  . Morbid obesity with body mass index of 40.0-44.9 in adult 10/22/2011    BMI 44.26    . Hypertension   . AV block, Mobitz 2 11/14/09    s/p MDT Revo PPM by JA    Past Surgical History  Procedure Laterality Date  . Ectopic pregnancy surgery      x2  . Bilateral knee arthroscopy    . Gastric bypass surgery    . Plastic surgery on face      for ptosis of rt. eyelid  . Left ovary and fallopian tube removed due to chronic pain    . Ppm  11/14/09    MDT Revo implanted by Dr Johney Frame for syncope/ Mobitz II AV block  . Pacemaker insertion  2011  . Cholecystectomy    . Portacath placement      power port - right  . Mastectomy  2013    bilateral  . Breast surgery  2012,12    lumpectomy,mastectomy bil  . Multiple extractions with alveoloplasty N/A 06/04/2012    Procedure: MULTIPLE EXTRACION WITH ALVEOLOPLASTY EXTRACT: 4, 5, 8, 9, 10, 13, 14 ,28;  Surgeon: [name], DDS;  Location: [facility];  Service: Oral Surgery;  Laterality: N/A;    Family History  Problem Relation Age of Onset  . Heart attack Father   . Breast cancer Sister   . Cancer Sister     brain  .  Brain cancer Sister   . Cancer Brother     lung  . Cancer Other     colon   Social History:  reports that she quit smoking about 27 years ago. Her smoking use included Cigarettes. She has a 2.4 pack-year smoking history. She has never used smokeless tobacco. She reports that she does not drink alcohol or use illicit drugs.  Allergies:  Allergies  Allergen Reactions  . Penicillins Swelling  . Sulfonamide Derivatives Rash  . Tape Other (See Comments)    Adhesive tape- RASH, SWELLING    Meds: ALPRAZolam (XANAX) 0.5 MG tablet Sig - Route: Take 1 mg by mouth at bedtime as needed for sleep. - Oral Class: Historical Med Number of times this order has been changed since signing: 3 [address] anastrozole (ARIMIDEX) 1 MG tablet 09/11/2011 Sig - Route: Take 1 mg by mouth at bedtime. - Oral Class: Historical Med Number of times this order has been changed since signing: 5 [address] clonazePAM (KLONOPIN) 1 MG tablet Sig - Route: Take 1 mg by mouth 2 (  two) times daily as needed for anxiety. - Oral Class: Historical Med Number of times this order has been changed since signing: 7 [address] cyclobenzaprine (FLEXERIL) 10 MG tablet Sig - Route: Take 10 mg by mouth daily. Muscle spasm - Oral Class: Historical Med Number of times this order has been changed since signing: 6 [address] DULoxetine (CYMBALTA) 60 MG capsule Sig - Route: Take 60 mg by mouth every evening. - Oral Class: Historical Med Number of times this order has been changed since signing: 3 [address] fentaNYL (DURAGESIC - DOSED MCG/HR) 100 MCG/HR Sig - Route: Place 2 patches onto the skin every 3 (three) days. - Transdermal Class: Historical Med Number of times this order has been changed since signing: 3 [address] furosemide (LASIX) 40 MG tablet Sig - Route: Take 40 mg by mouth daily. - Oral Class: Historical Med hydroxypropyl methylcellulose (ISOPTO TEARS) 2.5 % ophthalmic solution Sig - Route: Place 1 drop  into both eyes 4 (four) times daily as needed (dry eyes). - Both Eyes Class: Historical Med lidocaine-prilocaine (EMLA) cream Sig - Route: Apply 1 application topically as needed. For port (2.5%/2/5%) - Topical Class: Historical Med omeprazole (PRILOSEC) 20 MG capsule Sig - Route: Take 20 mg by mouth daily as needed (acid reflux). - Oral Class: Historical Med Number of times this order has been changed since signing: 4 Order [address] pregabalin (LYRICA) 100 MG capsule Sig - Route: Take 100 mg by mouth 2 (two) times daily. - Oral Class: Historical Med Number of times this order has been changed since signing: 1 [address] promethazine (PHENERGAN) 25 MG tablet Sig - Route: Take 25 mg by mouth every 8 (eight) hours as needed for nausea. - Oral Class: Historical Med Number of times this order has been changed since signing: 6 Order [address] spironolactone (ALDACTONE) 25 MG tablet Sig - Route: Take 25 mg by mouth 2 (two) times daily. - Oral Class: Historical Med Number of times this order has been changed since signing: 1 Order [address] triamterene-hydrochlorothiazide (DYAZIDE) 37.5-25 MG per capsule Sig - Route: Take 1 capsule by mouth daily as needed. Takes for edema. - Oral Class: Historical Med Number of times this order has been changed since signing: 3 Order [address] zolpidem (AMBIEN) 10 MG tablet Sig - Route: Take 10-15 mg by mouth at bedtime. - Oral Class: Historical Med     Review of Systems  Constitutional: Negative for fever.  Respiratory: Negative for shortness of breath.   Cardiovascular: Negative for chest pain.  Gastrointestinal: Negative for nausea, vomiting and abdominal pain.  Genitourinary: Positive for urgency.  Musculoskeletal: Positive for back pain.       Pain to left leg  Neurological: Negative for dizziness and headaches.    Blood pressure 103/67, pulse 77, temperature 97.8 F (36.6 C), temperature source Oral, resp. rate 20, SpO2 95.00%. Physical Exam   Constitutional: She is oriented to person, place, and time. She appears well-developed.  Cardiovascular: Normal rate, regular rhythm and normal heart sounds.   No murmur heard. Respiratory: Effort normal and breath sounds normal. She has no wheezes.  B Mastectomies  GI: Soft. Bowel sounds are normal. She exhibits distension.  Musculoskeletal: Normal range of motion.  Uses walker/cane  Neurological: She is alert and oriented to person, place, and time.  Skin: Skin is dry.  Rt upper arm bruise from recent fall  Psychiatric: She has a normal mood and affect. Her behavior is normal. Judgment  and thought content normal.     Assessment/Plan Hx Breast Ca Fell at home; back pain S/p L5 tumor ablation with VP/KP and Sacroplasty of S1 Now for repeat L5 VP due to continued pain. Pt aware of procedure benefits and risks and agreeable to proceed. Labs pending Consent signed and in chart  Brayton El 08/06/2012, 10:13 AM

## 2012-08-06 NOTE — ED Notes (Signed)
Patient denies pain and is resting comfortably.  

## 2012-08-10 ENCOUNTER — Ambulatory Visit (INDEPENDENT_AMBULATORY_CARE_PROVIDER_SITE_OTHER): Payer: Medicare Other | Admitting: Surgery

## 2012-08-10 ENCOUNTER — Encounter: Payer: Medicare Other | Admitting: Internal Medicine

## 2012-08-10 DIAGNOSIS — E876 Hypokalemia: Secondary | ICD-10-CM

## 2012-08-10 DIAGNOSIS — C7952 Secondary malignant neoplasm of bone marrow: Secondary | ICD-10-CM

## 2012-08-10 DIAGNOSIS — Z5112 Encounter for antineoplastic immunotherapy: Secondary | ICD-10-CM

## 2012-08-10 DIAGNOSIS — C7951 Secondary malignant neoplasm of bone: Secondary | ICD-10-CM

## 2012-08-10 DIAGNOSIS — C50919 Malignant neoplasm of unspecified site of unspecified female breast: Secondary | ICD-10-CM

## 2012-08-16 ENCOUNTER — Ambulatory Visit (INDEPENDENT_AMBULATORY_CARE_PROVIDER_SITE_OTHER): Payer: Medicare Other | Admitting: Surgery

## 2012-08-16 ENCOUNTER — Encounter (INDEPENDENT_AMBULATORY_CARE_PROVIDER_SITE_OTHER): Payer: Self-pay | Admitting: Surgery

## 2012-08-16 VITALS — BP 138/80 | HR 88 | Temp 98.2°F | Resp 14 | Ht 64.5 in | Wt 220.0 lb

## 2012-08-16 DIAGNOSIS — C50919 Malignant neoplasm of unspecified site of unspecified female breast: Secondary | ICD-10-CM | POA: Insufficient documentation

## 2012-08-16 DIAGNOSIS — C7951 Secondary malignant neoplasm of bone: Secondary | ICD-10-CM

## 2012-08-16 HISTORY — DX: Malignant neoplasm of unspecified site of unspecified female breast: C50.919

## 2012-08-16 NOTE — Progress Notes (Signed)
NAME: Ashlee Moore       DOB: 12-Feb-1960           DATE: 08/16/2012       MRN: [id number]  CC:  Chief Complaint  Patient presents with  . Breast Cancer Long Term Follow Up    Ashlee Moore is a 52 y.o.Marland Kitchenfemale who presents for routine followup of her Right breast cancer  diagnosed in 2012 and treated with lumpectomy, then chemo, then MRM, then radiation. She has no problems or concerns on either side other than a little excess skin at the medial aspect of the scar on the right. Unfortunately, she has developed bony mets, had more chemo and radiation.  PFSH: She has had no significant changes since the last visit here.  ROS: There have been no significant changes since the last visit here  EXAM:  VS: BP 138/80  Pulse 88  Temp(Src) 98.2 F (36.8 C) (Temporal)  Resp 14  Ht 5' 4.5" (1.638 m)  Wt 220 lb (99.791 kg)  BMI 37.19 kg/m2  General: The patient is alert, oriented, generally healthy appearing, NAD. Mood and affect are normal.  Breasts:  S/P bilateral mastectomy. Nicely healed, some increased pigmentation on the right from radiation. No evidence of local recurrence  Lymphatics: She has no axillary or supraclavicular adenopathy on either side.  Extremities: Full ROM of the surgical side with no lymphedema noted.  Data Reviewed: Notes in Epic  Impression: Doing well, with no evidence of local recurrence  Plan: Will see back in 6 months. She is interested in re-construction and we talked about this, but I recommend waiting, as she needs to be recovered from the current treatment regimen and, given her metastatic disease, may not be a candidate at all.

## 2012-08-24 DIAGNOSIS — C50919 Malignant neoplasm of unspecified site of unspecified female breast: Secondary | ICD-10-CM

## 2012-08-24 DIAGNOSIS — C7951 Secondary malignant neoplasm of bone: Secondary | ICD-10-CM

## 2012-08-24 DIAGNOSIS — C7952 Secondary malignant neoplasm of bone marrow: Secondary | ICD-10-CM

## 2012-08-31 DIAGNOSIS — Z5112 Encounter for antineoplastic immunotherapy: Secondary | ICD-10-CM

## 2012-08-31 DIAGNOSIS — C50919 Malignant neoplasm of unspecified site of unspecified female breast: Secondary | ICD-10-CM

## 2012-09-27 DIAGNOSIS — C7951 Secondary malignant neoplasm of bone: Secondary | ICD-10-CM

## 2012-09-27 DIAGNOSIS — C50919 Malignant neoplasm of unspecified site of unspecified female breast: Secondary | ICD-10-CM

## 2012-09-27 DIAGNOSIS — C7952 Secondary malignant neoplasm of bone marrow: Secondary | ICD-10-CM

## 2012-10-27 DIAGNOSIS — C7951 Secondary malignant neoplasm of bone: Secondary | ICD-10-CM

## 2012-10-27 DIAGNOSIS — C50919 Malignant neoplasm of unspecified site of unspecified female breast: Secondary | ICD-10-CM

## 2012-10-27 DIAGNOSIS — C7952 Secondary malignant neoplasm of bone marrow: Secondary | ICD-10-CM

## 2012-11-01 ENCOUNTER — Other Ambulatory Visit (HOSPITAL_COMMUNITY): Payer: Self-pay | Admitting: Hematology and Oncology

## 2012-11-01 DIAGNOSIS — C7951 Secondary malignant neoplasm of bone: Secondary | ICD-10-CM

## 2012-11-01 DIAGNOSIS — C50919 Malignant neoplasm of unspecified site of unspecified female breast: Secondary | ICD-10-CM

## 2012-11-01 DIAGNOSIS — C7952 Secondary malignant neoplasm of bone marrow: Secondary | ICD-10-CM

## 2012-11-05 ENCOUNTER — Ambulatory Visit (INDEPENDENT_AMBULATORY_CARE_PROVIDER_SITE_OTHER): Payer: Medicare Other | Admitting: *Deleted

## 2012-11-05 DIAGNOSIS — I441 Atrioventricular block, second degree: Secondary | ICD-10-CM

## 2012-11-05 LAB — PACEMAKER DEVICE OBSERVATION
AL AMPLITUDE: 3.5 mv
AL IMPEDENCE PM: 512 Ohm
BAMS-0001: 170 {beats}/min
BATTERY VOLTAGE: 3 V
RV LEAD IMPEDENCE PM: 480 Ohm
VENTRICULAR PACING PM: 0.14

## 2012-11-05 NOTE — Progress Notes (Signed)
Pacemaker check in clinic. Normal device function. Battery voltage 3.00 V. No episodes recorded. No changes made. ROV in 3 mths w/JA in [place].

## 2012-11-08 ENCOUNTER — Encounter (INDEPENDENT_AMBULATORY_CARE_PROVIDER_SITE_OTHER): Payer: Self-pay

## 2012-11-12 ENCOUNTER — Encounter (HOSPITAL_COMMUNITY)
Admission: RE | Admit: 2012-11-12 | Discharge: 2012-11-12 | Disposition: A | Discharge status: Home / Self Care | Payer: Medicare Other | Source: Ambulatory Visit | Attending: Hematology and Oncology | Admitting: Hematology and Oncology

## 2012-11-12 ENCOUNTER — Encounter (HOSPITAL_COMMUNITY): Payer: Self-pay

## 2012-11-12 DIAGNOSIS — C50919 Malignant neoplasm of unspecified site of unspecified female breast: Secondary | ICD-10-CM

## 2012-11-12 MED ORDER — FLUDEOXYGLUCOSE F - 18 (FDG) INJECTION
17.7000 | Freq: Once | INTRAVENOUS | Status: AC | PRN
  Administered 2012-11-12: 17.7 via INTRAVENOUS

## 2012-11-13 ENCOUNTER — Encounter: Payer: Self-pay | Admitting: Internal Medicine

## 2012-11-15 DIAGNOSIS — Z9010 Acquired absence of unspecified breast and nipple: Secondary | ICD-10-CM

## 2012-11-15 DIAGNOSIS — C7951 Secondary malignant neoplasm of bone: Secondary | ICD-10-CM

## 2012-11-15 DIAGNOSIS — R071 Chest pain on breathing: Secondary | ICD-10-CM

## 2012-11-15 DIAGNOSIS — R5081 Fever presenting with conditions classified elsewhere: Secondary | ICD-10-CM

## 2012-11-15 DIAGNOSIS — C7952 Secondary malignant neoplasm of bone marrow: Secondary | ICD-10-CM

## 2012-11-15 DIAGNOSIS — C50919 Malignant neoplasm of unspecified site of unspecified female breast: Secondary | ICD-10-CM

## 2012-11-16 DIAGNOSIS — C50919 Malignant neoplasm of unspecified site of unspecified female breast: Secondary | ICD-10-CM

## 2012-11-16 DIAGNOSIS — C7952 Secondary malignant neoplasm of bone marrow: Secondary | ICD-10-CM

## 2012-11-16 DIAGNOSIS — C7951 Secondary malignant neoplasm of bone: Secondary | ICD-10-CM

## 2012-11-24 DIAGNOSIS — C50919 Malignant neoplasm of unspecified site of unspecified female breast: Secondary | ICD-10-CM

## 2012-11-24 DIAGNOSIS — C7952 Secondary malignant neoplasm of bone marrow: Secondary | ICD-10-CM

## 2012-11-24 DIAGNOSIS — C7951 Secondary malignant neoplasm of bone: Secondary | ICD-10-CM

## 2012-12-23 DIAGNOSIS — E876 Hypokalemia: Secondary | ICD-10-CM

## 2012-12-23 DIAGNOSIS — C7951 Secondary malignant neoplasm of bone: Secondary | ICD-10-CM

## 2012-12-23 DIAGNOSIS — C50919 Malignant neoplasm of unspecified site of unspecified female breast: Secondary | ICD-10-CM

## 2012-12-23 DIAGNOSIS — C7952 Secondary malignant neoplasm of bone marrow: Secondary | ICD-10-CM

## 2013-01-20 ENCOUNTER — Ambulatory Visit (INDEPENDENT_AMBULATORY_CARE_PROVIDER_SITE_OTHER): Payer: Medicare Other | Admitting: Internal Medicine

## 2013-01-20 ENCOUNTER — Encounter: Payer: Self-pay | Admitting: Internal Medicine

## 2013-01-20 VITALS — BP 110/75 | HR 96 | Ht 64.0 in | Wt 232.0 lb

## 2013-01-20 DIAGNOSIS — C7951 Secondary malignant neoplasm of bone: Secondary | ICD-10-CM

## 2013-01-20 DIAGNOSIS — C7952 Secondary malignant neoplasm of bone marrow: Secondary | ICD-10-CM

## 2013-01-20 DIAGNOSIS — C50919 Malignant neoplasm of unspecified site of unspecified female breast: Secondary | ICD-10-CM

## 2013-01-20 DIAGNOSIS — I441 Atrioventricular block, second degree: Secondary | ICD-10-CM

## 2013-01-20 DIAGNOSIS — Z6841 Body Mass Index (BMI) 40.0 and over, adult: Secondary | ICD-10-CM

## 2013-01-20 LAB — MDC_IDC_ENUM_SESS_TYPE_INCLINIC
Brady Statistic AP VS Percent: 0.04 %
Brady Statistic AS VS Percent: 99.92 %
Brady Statistic RA Percent Paced: 0.04 %
Date Time Interrogation Session: 20141211092749
Lead Channel Impedance Value: 432 Ohm
Lead Channel Pacing Threshold Amplitude: 0.5 V
Lead Channel Pacing Threshold Pulse Width: 0.4 ms
Lead Channel Pacing Threshold Pulse Width: 0.4 ms
Lead Channel Setting Sensing Sensitivity: 0.9 mV
Zone Setting Detection Interval: 350 ms
Zone Setting Detection Interval: 400 ms

## 2013-01-20 NOTE — Patient Instructions (Signed)
Your physician recommends that you schedule a follow-up appointment in: 1 year with Dr. Johney Frame. You should receive a letter in the mail in 10 months. If you do not receive this letter by October 2015 call our office to schedule this appointment.   Your physician recommends that you continue on your current medications as directed. Please refer to the Current Medication list given to you today.  Remote check 04-25-2013
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
## 2013-02-02 ENCOUNTER — Encounter: Payer: Self-pay | Admitting: Internal Medicine

## 2013-02-17 ENCOUNTER — Ambulatory Visit (INDEPENDENT_AMBULATORY_CARE_PROVIDER_SITE_OTHER): Payer: Medicare Other | Admitting: General Surgery

## 2013-02-17 ENCOUNTER — Encounter (INDEPENDENT_AMBULATORY_CARE_PROVIDER_SITE_OTHER): Payer: Self-pay | Admitting: General Surgery

## 2013-02-17 VITALS — BP 132/87 | HR 83 | Temp 97.0°F | Resp 18 | Ht 64.0 in | Wt 238.8 lb

## 2013-02-17 DIAGNOSIS — C50911 Malignant neoplasm of unspecified site of right female breast: Secondary | ICD-10-CM

## 2013-02-17 DIAGNOSIS — C7952 Secondary malignant neoplasm of bone marrow: Secondary | ICD-10-CM

## 2013-02-17 DIAGNOSIS — C7951 Secondary malignant neoplasm of bone: Secondary | ICD-10-CM

## 2013-02-17 DIAGNOSIS — C50919 Malignant neoplasm of unspecified site of unspecified female breast: Secondary | ICD-10-CM

## 2013-02-17 NOTE — Patient Instructions (Signed)
I am sending a message to Dr. Jackalyn Lombard office about your swelling.    We are calling Dr. Reynaldo Minium office regarding your appointment to see if it can be moved up.    I will see you back in 1 year.

## 2013-02-18 NOTE — Progress Notes (Signed)
NAME: [name]       DOB: 1960/10/24           DATE: 02/17/2013       MRN: [id number]  CC:  Chief Complaint  Patient presents with  . Breast Cancer Long Term Follow Up    RAIYAH SPEAKMAN is a 53 y.o.Marland Kitchenfemale who presents for routine followup of her Right breast cancer  diagnosed in 2012 and treated with lumpectomy, then chemo, then MRM, then radiation. She has no problems or concerns on either side other than a little excess skin at the medial aspect of the scar on the right. Unfortunately, she has developed bony mets, had more chemo and radiation.  PFSH: She has had no significant changes since the last visit here, other than worsening pain.  She states that she has had back surgery.    ROS: Otherwise negative x 11  EXAM:  VS: BP 132/87  Pulse 83  Temp(Src) 97 F (36.1 C) (Temporal)  Resp 18  Ht 5\' 4"  (1.626 m)  Wt 238 lb 12.8 oz (108.319 kg)  BMI 40.97 kg/m2  General: The patient is alert, oriented.  She is very tearful.    Breasts:  S/P bilateral mastectomy.  No evidence of chest wall recurrence. No point tenderness.     Lymphatics: She has no axillary or supraclavicular adenopathy on either side.  Extremities: Full ROM of the surgical side with no lymphedema noted.  Data Reviewed: Notes in Epic  Impression: Doing well, with no evidence of local recurrence  Plan: She is referred back to Dr. Jacquiline Doe.  I cannot tell if she is always this anxious and tearful, or if it is because she is anxious about having a new provider since Dr. Dickie La retirement.  Breast cancer metastasized to bone I requested that the patient's appointment with oncology be moved up a bit to address her pain.  I am not clear exactly what regimen she is on for breast cancer other than Arimidex.    I do not have her recent oncology notes are not sure if she is in a stable point for metastatic disease or if things are worsening.  Breast cancer, right breast I do not feel any evidence of  chest wall recurrence. I not sure if the chest pain that she is having is related to bony metastases in the ribs, or if she has some muscular strain from adjusting her gait and balance from her lower back and hip pain.

## 2013-02-18 NOTE — Assessment & Plan Note (Signed)
I do not feel any evidence of chest wall recurrence. I not sure if the chest pain that she is having is related to bony metastases in the ribs, or if she has some muscular strain from adjusting her gait and balance from her lower back and hip pain.

## 2013-02-18 NOTE — Assessment & Plan Note (Signed)
I requested that the patient's appointment with oncology be moved up a bit to address her pain.  I am not clear exactly what regimen she is on for breast cancer other than Arimidex.    I do not have her recent oncology notes are not sure if she is in a stable point for metastatic disease or if things are worsening.

## 2013-02-21 ENCOUNTER — Ambulatory Visit (INDEPENDENT_AMBULATORY_CARE_PROVIDER_SITE_OTHER): Payer: Medicare Other | Admitting: Nurse Practitioner

## 2013-02-21 ENCOUNTER — Encounter (INDEPENDENT_AMBULATORY_CARE_PROVIDER_SITE_OTHER): Payer: Self-pay

## 2013-02-21 ENCOUNTER — Encounter: Payer: Self-pay | Admitting: Nurse Practitioner

## 2013-02-21 VITALS — BP 140/70 | HR 95 | Ht 64.0 in | Wt 224.4 lb

## 2013-02-21 DIAGNOSIS — R609 Edema, unspecified: Secondary | ICD-10-CM

## 2013-02-21 DIAGNOSIS — Z01818 Encounter for other preprocedural examination: Secondary | ICD-10-CM

## 2013-02-21 NOTE — Progress Notes (Signed)
Ashlee Moore Date of Birth: 1961-01-25 Medical Record #[id number]  History of Present Illness: Ashlee Moore is seen back today for a follow up visit. Seen for Dr. Rayann Heman. She is a 53 year old female with metastatic breast cancer, prior AV block and has PPM in place, obesity, bipolar illness and HTN. Other issues as listed.  Was just here a month ago - felt to be doing ok. Saw Dr. Barry Dienes (in light of Dr. Dickie La retirement) and made a notation about some swelling. Thus added to my schedule today.   Comes in today. Here alone. Actually here for a pre operative clearance visit. Says she does not take care of her self. Diet quite poor. Has had some atypical chest pain - feels a "stinging" like sensation over her chest - hurts to touch her chest. No exertional symptoms. Not short of breath. Not very active due to poor knees and wishing to have arthroscopic knee surgery in [facility] - Dr. Shanon Brow Case. Weight "yoyos" most likely as a result from salt. Did take an extra fluid pill last week and has been on chronic Lasix.   Current Outpatient Prescriptions  Medication Sig Dispense Refill  . ALPRAZolam (XANAX) 0.5 MG tablet Take 1 mg by mouth at bedtime as needed for sleep.       Marland Kitchen anastrozole (ARIMIDEX) 1 MG tablet Take 1 mg by mouth at bedtime.       . clonazePAM (KLONOPIN) 1 MG tablet Take 1 mg by mouth 2 (two) times daily as needed for anxiety.       . cyclobenzaprine (FLEXERIL) 10 MG tablet Take 10 mg by mouth daily. Muscle spasm      . DULoxetine (CYMBALTA) 60 MG capsule Take 60 mg by mouth every evening.       . fentaNYL (DURAGESIC - DOSED MCG/HR) 100 MCG/HR Place 2 patches onto the skin every 3 (three) days.       . furosemide (LASIX) 40 MG tablet Take 40 mg by mouth daily.      . hydroxypropyl methylcellulose (ISOPTO TEARS) 2.5 % ophthalmic solution Place 1 drop into both eyes 4 (four) times daily as needed (dry eyes).      Marland Kitchen lidocaine-prilocaine (EMLA) cream Apply 1 application topically as  needed. For port (2.5%/2/5%)      . Oxycodone HCl 20 MG TABS Take 2 tablets by mouth every 3 (three) hours.       . potassium chloride SA (K-DUR,KLOR-CON) 20 MEQ tablet 20 mEq daily.       . pregabalin (LYRICA) 100 MG capsule Take 100 mg by mouth 2 (two) times daily.      . promethazine (PHENERGAN) 25 MG tablet Take 25 mg by mouth every 8 (eight) hours as needed for nausea.       Marland Kitchen spironolactone (ALDACTONE) 25 MG tablet Take 25 mg by mouth 2 (two) times daily.      Marland Kitchen zolpidem (AMBIEN) 10 MG tablet Take 10-15 mg by mouth at bedtime.        No current facility-administered medications for this visit.    Allergies  Allergen Reactions  . Penicillins Swelling  . Sulfonamide Derivatives Rash  . Tape Other (See Comments)    Adhesive tape- RASH, SWELLING    Past Medical History  Diagnosis Date  . Bipolar disorder   . Fibromyalgia   . Anemia   . Elevated LFTs     hx  . Arthritis   . Osteoporosis   . Chills   .  Fever   . Generalized headaches   . Weakness   . Easy bruising   . Pacemaker   . GERD (gastroesophageal reflux disease)   . Sleep apnea     STOP BANG SCORE 4  . Cancer     right breast/LAST CHEOM 10/07/11  . Morbid obesity with body mass index of 40.0-44.9 in adult 10/22/2011    BMI 44.26    . Hypertension   . AV block, Mobitz 2 11/14/09    s/p MDT Revo PPM by JA    Past Surgical History  Procedure Laterality Date  . Ectopic pregnancy surgery      x2  . Bilateral knee arthroscopy    . Gastric bypass surgery    . Plastic surgery on face      for ptosis of rt. eyelid  . Left ovary and fallopian tube removed due to chronic pain    . Ppm  11/14/09    MDT Revo implanted by Dr Rayann Heman for syncope/ Mobitz II AV block  . Pacemaker insertion  2011  . Cholecystectomy    . Portacath placement      power port - right  . Mastectomy  2013    bilateral  . Breast surgery  2012,12    lumpectomy,mastectomy bil  . Multiple extractions with alveoloplasty N/A 06/04/2012     Procedure: MULTIPLE EXTRACION WITH ALVEOLOPLASTY EXTRACT: 4, 5, 8, 9, 10, 13, 14 ,28;  Surgeon: Gae Bon, DDS;  Location: [facility];  Service: Oral Surgery;  Laterality: N/A;    History  Smoking status  . Former Smoker -- 0.30 packs/day for 8 years  . Types: Cigarettes  . Quit date: 02/10/1985  Smokeless tobacco  . Never Used    Comment: Quit 25 years back.     History  Alcohol Use No    Family History  Problem Relation Age of Onset  . Heart attack Father   . Breast cancer Sister   . Cancer Sister     brain  . Brain cancer Sister   . Cancer Brother     lung  . Cancer Other     colon    Review of Systems: The review of systems is per the HPI.  All other systems were reviewed and are negative.  Physical Exam: Pulse 95  Ht 5\' 4"  (1.626 m)  Wt 224 lb 6.4 oz (101.787 kg)  BMI 38.50 kg/m2  SpO2 93% Patient is alert and in no acute distress. Seems a little sedated/lethargic to me. Morbidly obese. Skin is warm and dry. Color is normal.  HEENT is unremarkable. Normocephalic/atraumatic. PERRL. Sclera are nonicteric. Neck is supple. No masses. No JVD. Lungs are clear. Cardiac exam shows a regular rate and rhythm. Abdomen is soft. Extremities are quite full but without significant edema. Gait and ROM are intact. She is using a cane. No gross neurologic deficits noted.  Wt Readings from Last 3 Encounters:  02/21/13 224 lb 6.4 oz (101.787 kg)  02/17/13 238 lb 12.8 oz (108.319 kg)  01/20/13 232 lb (105.235 kg)     LABORATORY DATA: EKG today shows sinus rhythm with RSR'. No acute changes noted.   Lab Results  Component Value Date   WBC 4.9 08/06/2012   HGB 13.4 08/06/2012   HCT 39.4 08/06/2012   PLT 303 08/06/2012   GLUCOSE 95 08/06/2012   ALT 20 05/28/2012   AST 20 05/28/2012   NA 135 08/06/2012   K 2.9* 08/06/2012   CL 97 08/06/2012  CREATININE 0.63 08/06/2012   BUN 9 08/06/2012   CO2 30 08/06/2012   INR 0.99 08/06/2012     Assessment / Plan: 1. Prior AV block with PPM in  place - just checked a month ago - was ok  2. HTN  3. Metastatic breast cancer - followed by oncology  4. Swelling - on several medicines that could be contributing and sounds like her diet is a big contributing factor as well.   5. Pre operative clearance for arthroscopic knee surgery - seems to be an acceptable candidate from our standpoint. Her chest pain is clearly reproducible with palpation. I do not think she needs further work up at this time. May need general medicine to see prior to surgery.   Patient is agreeable to this plan and will call if any problems develop in the interim.   Burtis Junes, RN, [facility] [address] [place], [place]  [phone]

## 2013-02-21 NOTE — Patient Instructions (Addendum)
We will check an EKG today - this looks ok.   I think you are an acceptable candidate for arthroscopic knee surgery  Keep your follow up with Dr. Rayann Heman  Call the [place] office at [phone] if you have any questions, problems or concerns.

## 2013-04-25 ENCOUNTER — Encounter: Payer: Medicare Other | Admitting: *Deleted

## 2013-05-02 ENCOUNTER — Encounter: Payer: Self-pay | Admitting: *Deleted

## 2013-07-06 ENCOUNTER — Other Ambulatory Visit (HOSPITAL_COMMUNITY): Payer: Self-pay | Admitting: Internal Medicine

## 2013-07-06 DIAGNOSIS — C50919 Malignant neoplasm of unspecified site of unspecified female breast: Secondary | ICD-10-CM

## 2013-07-13 ENCOUNTER — Encounter: Payer: Self-pay | Admitting: Cardiology

## 2013-07-20 ENCOUNTER — Ambulatory Visit (HOSPITAL_COMMUNITY)
Admission: RE | Admit: 2013-07-20 | Discharge: 2013-07-20 | Disposition: A | Discharge status: Home / Self Care | Payer: Medicare Other | Source: Ambulatory Visit | Attending: Internal Medicine | Admitting: Internal Medicine

## 2013-07-20 ENCOUNTER — Encounter (HOSPITAL_COMMUNITY): Payer: Self-pay

## 2013-07-20 DIAGNOSIS — C7952 Secondary malignant neoplasm of bone marrow: Secondary | ICD-10-CM

## 2013-07-20 DIAGNOSIS — C7951 Secondary malignant neoplasm of bone: Secondary | ICD-10-CM | POA: Diagnosis not present

## 2013-07-20 DIAGNOSIS — J984 Other disorders of lung: Secondary | ICD-10-CM | POA: Diagnosis not present

## 2013-07-20 DIAGNOSIS — I517 Cardiomegaly: Secondary | ICD-10-CM | POA: Insufficient documentation

## 2013-07-20 DIAGNOSIS — E041 Nontoxic single thyroid nodule: Secondary | ICD-10-CM | POA: Insufficient documentation

## 2013-07-20 DIAGNOSIS — K449 Diaphragmatic hernia without obstruction or gangrene: Secondary | ICD-10-CM | POA: Insufficient documentation

## 2013-07-20 DIAGNOSIS — C50919 Malignant neoplasm of unspecified site of unspecified female breast: Secondary | ICD-10-CM

## 2013-07-20 DIAGNOSIS — Z950 Presence of cardiac pacemaker: Secondary | ICD-10-CM | POA: Diagnosis not present

## 2013-07-20 DIAGNOSIS — D1779 Benign lipomatous neoplasm of other sites: Secondary | ICD-10-CM | POA: Insufficient documentation

## 2013-07-20 DIAGNOSIS — Z9010 Acquired absence of unspecified breast and nipple: Secondary | ICD-10-CM | POA: Diagnosis not present

## 2013-07-20 DIAGNOSIS — G9389 Other specified disorders of brain: Secondary | ICD-10-CM | POA: Insufficient documentation

## 2013-07-20 LAB — GLUCOSE, CAPILLARY: Glucose-Capillary: 102 mg/dL — ABNORMAL HIGH (ref 70–99)

## 2013-07-20 MED ORDER — FLUDEOXYGLUCOSE F - 18 (FDG) INJECTION
10.8000 | Freq: Once | INTRAVENOUS | Status: AC | PRN
  Administered 2013-07-20: 10.8 via INTRAVENOUS

## 2013-08-29 ENCOUNTER — Other Ambulatory Visit (HOSPITAL_COMMUNITY): Payer: Self-pay | Admitting: Interventional Radiology

## 2013-08-29 DIAGNOSIS — M549 Dorsalgia, unspecified: Secondary | ICD-10-CM

## 2013-08-31 ENCOUNTER — Ambulatory Visit (HOSPITAL_COMMUNITY)
Admission: RE | Admit: 2013-08-31 | Discharge: 2013-08-31 | Disposition: A | Discharge status: Home / Self Care | Payer: Medicare Other | Source: Ambulatory Visit | Attending: Interventional Radiology | Admitting: Interventional Radiology

## 2013-08-31 DIAGNOSIS — M549 Dorsalgia, unspecified: Secondary | ICD-10-CM

## 2013-09-07 ENCOUNTER — Telehealth (HOSPITAL_COMMUNITY): Payer: Self-pay | Admitting: Interventional Radiology

## 2013-09-07 ENCOUNTER — Other Ambulatory Visit (HOSPITAL_COMMUNITY): Payer: Self-pay | Admitting: Interventional Radiology

## 2013-09-07 DIAGNOSIS — T148XXA Other injury of unspecified body region, initial encounter: Secondary | ICD-10-CM

## 2013-09-07 DIAGNOSIS — C50919 Malignant neoplasm of unspecified site of unspecified female breast: Secondary | ICD-10-CM

## 2013-09-07 DIAGNOSIS — M549 Dorsalgia, unspecified: Secondary | ICD-10-CM

## 2013-09-07 NOTE — Telephone Encounter (Signed)
Called pt again, left VM for her to call to schedule MRI lumbar spine JM

## 2013-09-07 NOTE — Telephone Encounter (Signed)
Called pt, left VM for her to call to schedule MRI Lumbar JM

## 2013-09-14 ENCOUNTER — Telehealth: Payer: Self-pay | Admitting: *Deleted

## 2013-09-14 NOTE — Telephone Encounter (Signed)
The below information was sent to radiologist. Patient informed to call office to reschedule f/u visit for PPM check.

## 2013-09-14 NOTE — Telephone Encounter (Signed)
Message copied by Merlene Laughter on Wed Sep 14, 2013  8:00 AM ------      Message from: [place], [place]      Created: Tue Sep 13, 2013  5:03 PM      Regarding: RE: DOES SHE NEED AN APPOINTMENT WITH YOU? LOOKS LIKE SHE MISSED HER LAST APPOINTMENT IN MARCH 2015       Yes it is fine for her to have MRI            Erasmo Downer            ----- Message -----         From: Merlene Laughter, LPN         Sent: 3/0/9407   4:57 PM           To: Sharlot Gowda      Subject: DOES SHE NEED AN APPOINTMENT WITH YOU? LOOKS#            She is also said she is having a MRI Friday and want to know if its okay with her PPM. I told her as long as its been past 6 weeks it should be okay.       ------

## 2013-09-15 ENCOUNTER — Ambulatory Visit (HOSPITAL_COMMUNITY)
Admission: RE | Admit: 2013-09-15 | Discharge: 2013-09-15 | Disposition: A | Discharge status: Home / Self Care | Payer: Medicare Other | Source: Ambulatory Visit | Attending: Interventional Radiology | Admitting: Interventional Radiology

## 2013-09-15 ENCOUNTER — Other Ambulatory Visit (HOSPITAL_COMMUNITY): Payer: Self-pay | Admitting: Interventional Radiology

## 2013-09-15 DIAGNOSIS — M549 Dorsalgia, unspecified: Secondary | ICD-10-CM | POA: Insufficient documentation

## 2013-09-15 DIAGNOSIS — Y999 Unspecified external cause status: Secondary | ICD-10-CM | POA: Insufficient documentation

## 2013-09-15 DIAGNOSIS — T148XXA Other injury of unspecified body region, initial encounter: Secondary | ICD-10-CM | POA: Diagnosis not present

## 2013-09-15 DIAGNOSIS — C50919 Malignant neoplasm of unspecified site of unspecified female breast: Secondary | ICD-10-CM | POA: Diagnosis not present

## 2013-09-15 DIAGNOSIS — Y929 Unspecified place or not applicable: Secondary | ICD-10-CM | POA: Diagnosis not present

## 2013-09-15 DIAGNOSIS — X58XXXA Exposure to other specified factors, initial encounter: Secondary | ICD-10-CM | POA: Diagnosis not present

## 2013-09-22 ENCOUNTER — Other Ambulatory Visit (HOSPITAL_COMMUNITY): Payer: Self-pay | Admitting: Interventional Radiology

## 2013-09-22 ENCOUNTER — Ambulatory Visit (HOSPITAL_COMMUNITY)
Admission: RE | Admit: 2013-09-22 | Discharge: 2013-09-22 | Disposition: A | Discharge status: Home / Self Care | Payer: Medicare Other | Source: Ambulatory Visit | Attending: Interventional Radiology | Admitting: Interventional Radiology

## 2013-09-22 DIAGNOSIS — C50919 Malignant neoplasm of unspecified site of unspecified female breast: Secondary | ICD-10-CM

## 2013-09-22 DIAGNOSIS — T148XXA Other injury of unspecified body region, initial encounter: Secondary | ICD-10-CM

## 2013-09-22 DIAGNOSIS — M549 Dorsalgia, unspecified: Secondary | ICD-10-CM

## 2013-09-23 ENCOUNTER — Other Ambulatory Visit (HOSPITAL_COMMUNITY): Payer: Self-pay | Admitting: Interventional Radiology

## 2013-09-23 ENCOUNTER — Encounter (HOSPITAL_COMMUNITY): Payer: Self-pay

## 2013-09-23 ENCOUNTER — Other Ambulatory Visit: Payer: Self-pay | Admitting: Radiology

## 2013-09-23 ENCOUNTER — Ambulatory Visit (HOSPITAL_COMMUNITY)
Admission: RE | Admit: 2013-09-23 | Discharge: 2013-09-23 | Disposition: A | Discharge status: Home / Self Care | Payer: Medicare Other | Source: Ambulatory Visit | Attending: Interventional Radiology | Admitting: Interventional Radiology

## 2013-09-23 DIAGNOSIS — T148XXA Other injury of unspecified body region, initial encounter: Secondary | ICD-10-CM

## 2013-09-23 DIAGNOSIS — M549 Dorsalgia, unspecified: Secondary | ICD-10-CM

## 2013-09-23 DIAGNOSIS — I10 Essential (primary) hypertension: Secondary | ICD-10-CM | POA: Insufficient documentation

## 2013-09-23 DIAGNOSIS — Z950 Presence of cardiac pacemaker: Secondary | ICD-10-CM | POA: Insufficient documentation

## 2013-09-23 DIAGNOSIS — M8448XA Pathological fracture, other site, initial encounter for fracture: Secondary | ICD-10-CM | POA: Insufficient documentation

## 2013-09-23 DIAGNOSIS — Z9884 Bariatric surgery status: Secondary | ICD-10-CM | POA: Diagnosis not present

## 2013-09-23 DIAGNOSIS — F319 Bipolar disorder, unspecified: Secondary | ICD-10-CM | POA: Insufficient documentation

## 2013-09-23 DIAGNOSIS — Z87891 Personal history of nicotine dependence: Secondary | ICD-10-CM | POA: Insufficient documentation

## 2013-09-23 DIAGNOSIS — C50919 Malignant neoplasm of unspecified site of unspecified female breast: Secondary | ICD-10-CM

## 2013-09-23 DIAGNOSIS — C7951 Secondary malignant neoplasm of bone: Secondary | ICD-10-CM | POA: Diagnosis not present

## 2013-09-23 DIAGNOSIS — S22000A Wedge compression fracture of unspecified thoracic vertebra, initial encounter for closed fracture: Secondary | ICD-10-CM

## 2013-09-23 DIAGNOSIS — C801 Malignant (primary) neoplasm, unspecified: Secondary | ICD-10-CM | POA: Diagnosis not present

## 2013-09-23 DIAGNOSIS — Z9010 Acquired absence of unspecified breast and nipple: Secondary | ICD-10-CM | POA: Diagnosis not present

## 2013-09-23 DIAGNOSIS — M810 Age-related osteoporosis without current pathological fracture: Secondary | ICD-10-CM | POA: Diagnosis not present

## 2013-09-23 DIAGNOSIS — Z853 Personal history of malignant neoplasm of breast: Secondary | ICD-10-CM | POA: Diagnosis not present

## 2013-09-23 DIAGNOSIS — Z6837 Body mass index (BMI) 37.0-37.9, adult: Secondary | ICD-10-CM | POA: Insufficient documentation

## 2013-09-23 DIAGNOSIS — IMO0001 Reserved for inherently not codable concepts without codable children: Secondary | ICD-10-CM | POA: Insufficient documentation

## 2013-09-23 DIAGNOSIS — C7952 Secondary malignant neoplasm of bone marrow: Secondary | ICD-10-CM | POA: Diagnosis not present

## 2013-09-23 DIAGNOSIS — K219 Gastro-esophageal reflux disease without esophagitis: Secondary | ICD-10-CM | POA: Diagnosis not present

## 2013-09-23 LAB — CBC WITH DIFFERENTIAL/PLATELET
Basophils Absolute: 0 10*3/uL (ref 0.0–0.1)
Basophils Relative: 0 % (ref 0–1)
EOS PCT: 4 % (ref 0–5)
Eosinophils Absolute: 0.2 10*3/uL (ref 0.0–0.7)
HEMATOCRIT: 40.4 % (ref 36.0–46.0)
Hemoglobin: 12.7 g/dL (ref 12.0–15.0)
LYMPHS ABS: 0.9 10*3/uL (ref 0.7–4.0)
Lymphocytes Relative: 16 % (ref 12–46)
MCH: 27 pg (ref 26.0–34.0)
MCHC: 31.4 g/dL (ref 30.0–36.0)
MCV: 85.8 fL (ref 78.0–100.0)
MONO ABS: 0.5 10*3/uL (ref 0.1–1.0)
Monocytes Relative: 8 % (ref 3–12)
Neutro Abs: 3.9 10*3/uL (ref 1.7–7.7)
Neutrophils Relative %: 72 % (ref 43–77)
Platelets: 293 10*3/uL (ref 150–400)
RBC: 4.71 MIL/uL (ref 3.87–5.11)
RDW: 13.7 % (ref 11.5–15.5)
WBC: 5.4 10*3/uL (ref 4.0–10.5)

## 2013-09-23 LAB — COMPREHENSIVE METABOLIC PANEL
ALT: 15 U/L (ref 0–35)
AST: 18 U/L (ref 0–37)
Albumin: 3.3 g/dL — ABNORMAL LOW (ref 3.5–5.2)
Alkaline Phosphatase: 99 U/L (ref 39–117)
Anion gap: 10 (ref 5–15)
BUN: 12 mg/dL (ref 6–23)
CALCIUM: 9.1 mg/dL (ref 8.4–10.5)
CO2: 29 meq/L (ref 19–32)
CREATININE: 0.75 mg/dL (ref 0.50–1.10)
Chloride: 101 mEq/L (ref 96–112)
GLUCOSE: 94 mg/dL (ref 70–99)
Potassium: 4.1 mEq/L (ref 3.7–5.3)
Sodium: 140 mEq/L (ref 137–147)
Total Bilirubin: 0.2 mg/dL — ABNORMAL LOW (ref 0.3–1.2)
Total Protein: 7.3 g/dL (ref 6.0–8.3)

## 2013-09-23 LAB — PROTIME-INR
INR: 1.03 (ref 0.00–1.49)
PROTHROMBIN TIME: 13.5 s (ref 11.6–15.2)

## 2013-09-23 LAB — APTT: aPTT: 31 seconds (ref 24–37)

## 2013-09-23 MED ORDER — BUPIVACAINE HCL (PF) 0.25 % IJ SOLN
INTRAMUSCULAR | Status: AC
  Filled 2013-09-23: qty 30

## 2013-09-23 MED ORDER — HYDROMORPHONE HCL PF 1 MG/ML IJ SOLN
INTRAMUSCULAR | Status: AC
  Filled 2013-09-23: qty 2

## 2013-09-23 MED ORDER — SODIUM CHLORIDE 0.9 % IV SOLN
Freq: Once | INTRAVENOUS | Status: AC
  Administered 2013-09-23: 09:00:00 via INTRAVENOUS

## 2013-09-23 MED ORDER — MIDAZOLAM HCL 2 MG/2ML IJ SOLN
INTRAMUSCULAR | Status: AC
  Filled 2013-09-23: qty 4

## 2013-09-23 MED ORDER — HYDROMORPHONE HCL PF 1 MG/ML IJ SOLN
INTRAMUSCULAR | Status: AC | PRN
  Administered 2013-09-23 (×3): 1 mg

## 2013-09-23 MED ORDER — MIDAZOLAM HCL 2 MG/2ML IJ SOLN
INTRAMUSCULAR | Status: AC | PRN
  Administered 2013-09-23 (×4): 1 mg via INTRAVENOUS

## 2013-09-23 MED ORDER — FENTANYL CITRATE 0.05 MG/ML IJ SOLN
INTRAMUSCULAR | Status: AC | PRN
  Administered 2013-09-23 (×5): 25 ug via INTRAVENOUS

## 2013-09-23 MED ORDER — TOBRAMYCIN SULFATE 1.2 G IJ SOLR
INTRAMUSCULAR | Status: AC
  Filled 2013-09-23: qty 1.2

## 2013-09-23 MED ORDER — HYDROMORPHONE HCL PF 1 MG/ML IJ SOLN
INTRAMUSCULAR | Status: AC
  Filled 2013-09-23: qty 1

## 2013-09-23 MED ORDER — SODIUM CHLORIDE 0.9 % IV SOLN
1500.0000 mg | INTRAVENOUS | Status: AC
  Administered 2013-09-23: 1500 mg via INTRAVENOUS
  Filled 2013-09-23: qty 1500

## 2013-09-23 MED ORDER — SODIUM CHLORIDE 0.9 % IV SOLN: INTRAVENOUS | Status: AC

## 2013-09-23 MED ORDER — FENTANYL CITRATE 0.05 MG/ML IJ SOLN
INTRAMUSCULAR | Status: AC
  Filled 2013-09-23: qty 4

## 2013-09-23 NOTE — Procedures (Signed)
S/P T 10 RF ablation followed by vertebral body  augmentation

## 2013-09-23 NOTE — H&P (Signed)
Ashlee Moore is an 53 y.o. female.   Chief Complaint: Pt with Hx Breast Ca Has had previous Sacroplasty andf Lumbar 5 Vertebroplasty 06/2013 with pain relief Now has had recurrence back pain for few weeks PET 07/20/13 reveals progressive osseous metastasis to T 10 Now scheduled for Thoracic 10 lesion ablation with augmentation and biopsy  HPI: Bipolar; fibromyalgia; osteoporosis; pacemaker; Breast Ca; HTN  Past Medical History  Diagnosis Date  . Bipolar disorder   . Fibromyalgia   . Anemia   . Elevated LFTs     hx  . Arthritis   . Osteoporosis   . Chills   . Fever   . Generalized headaches   . Weakness   . Easy bruising   . Pacemaker   . GERD (gastroesophageal reflux disease)   . Sleep apnea     STOP BANG SCORE 4  . Cancer     right breast/LAST CHEOM 10/07/11  . Morbid obesity with body mass index of 40.0-44.9 in adult 10/22/2011    BMI 44.26    . Hypertension   . AV block, Mobitz 2 11/14/09    s/p MDT Revo PPM by JA    Past Surgical History  Procedure Laterality Date  . Ectopic pregnancy surgery      x2  . Bilateral knee arthroscopy    . Gastric bypass surgery    . Plastic surgery on face      for ptosis of rt. eyelid  . Left ovary and fallopian tube removed due to chronic pain    . Ppm  11/14/09    MDT Revo implanted by Dr Rayann Heman for syncope/ Mobitz II AV block  . Pacemaker insertion  2011  . Cholecystectomy    . Portacath placement      power port - right  . Mastectomy  2013    bilateral  . Breast surgery  2012,12    lumpectomy,mastectomy bil  . Multiple extractions with alveoloplasty N/A 06/04/2012    Procedure: MULTIPLE EXTRACION WITH ALVEOLOPLASTY EXTRACT: 4, 5, 8, 9, 10, 13, 14 ,28;  Surgeon: Gae Bon, DDS;  Location: [facility];  Service: Oral Surgery;  Laterality: N/A;    Family History  Problem Relation Age of Onset  . Heart attack Father   . Breast cancer Sister   . Cancer Sister     brain  . Brain cancer Sister   . Cancer Brother     lung   . Cancer Other     colon   Social History:  reports that she quit smoking about 28 years ago. Her smoking use included Cigarettes. She has a 2.4 pack-year smoking history. She has never used smokeless tobacco. She reports that she does not drink alcohol or use illicit drugs.  Allergies:  Allergies  Allergen Reactions  . Penicillins Swelling  . Sulfonamide Derivatives Rash  . Tape Other (See Comments)    Adhesive tape- RASH, SWELLING     (Not in a hospital admission)  Results for orders placed during the hospital encounter of 09/23/13 (from the past 48 hour(s))  APTT     Status: None   Collection Time    09/23/13  8:55 AM      Result Value Ref Range   aPTT 31  24 - 37 seconds  CBC WITH DIFFERENTIAL     Status: None   Collection Time    09/23/13  8:55 AM      Result Value Ref Range   WBC 5.4  4.0 -  10.5 K/uL   RBC 4.71  3.87 - 5.11 MIL/uL   Hemoglobin 12.7  12.0 - 15.0 g/dL   HCT 40.4  36.0 - 46.0 %   MCV 85.8  78.0 - 100.0 fL   MCH 27.0  26.0 - 34.0 pg   MCHC 31.4  30.0 - 36.0 g/dL   RDW 13.7  11.5 - 15.5 %   Platelets 293  150 - 400 K/uL   Neutrophils Relative % 72  43 - 77 %   Neutro Abs 3.9  1.7 - 7.7 K/uL   Lymphocytes Relative 16  12 - 46 %   Lymphs Abs 0.9  0.7 - 4.0 K/uL   Monocytes Relative 8  3 - 12 %   Monocytes Absolute 0.5  0.1 - 1.0 K/uL   Eosinophils Relative 4  0 - 5 %   Eosinophils Absolute 0.2  0.0 - 0.7 K/uL   Basophils Relative 0  0 - 1 %   Basophils Absolute 0.0  0.0 - 0.1 K/uL  COMPREHENSIVE METABOLIC PANEL     Status: Abnormal   Collection Time    09/23/13  8:55 AM      Result Value Ref Range   Sodium 140  137 - 147 mEq/L   Potassium 4.1  3.7 - 5.3 mEq/L   Chloride 101  96 - 112 mEq/L   CO2 29  19 - 32 mEq/L   Glucose, Bld 94  70 - 99 mg/dL   BUN 12  6 - 23 mg/dL   Creatinine, Ser 0.75  0.50 - 1.10 mg/dL   Calcium 9.1  8.4 - 10.5 mg/dL   Total Protein 7.3  6.0 - 8.3 g/dL   Albumin 3.3 (*) 3.5 - 5.2 g/dL   AST 18  0 - 37 U/L   ALT 15   0 - 35 U/L   Alkaline Phosphatase 99  39 - 117 U/L   Total Bilirubin 0.2 (*) 0.3 - 1.2 mg/dL   GFR calc non Af Amer >90  >90 mL/min   GFR calc Af Amer >90  >90 mL/min   Comment: (NOTE)     The eGFR has been calculated using the CKD EPI equation.     This calculation has not been validated in all clinical situations.     eGFR's persistently <90 mL/min signify possible Chronic Kidney     Disease.   Anion gap 10  5 - 15  PROTIME-INR     Status: None   Collection Time    09/23/13  8:55 AM      Result Value Ref Range   Prothrombin Time 13.5  11.6 - 15.2 seconds   INR 1.03  0.00 - 1.49   No results found.  Review of Systems  Constitutional: Positive for malaise/fatigue. Negative for fever and weight loss.  Eyes: Negative for blurred vision.  Respiratory: Negative for shortness of breath.   Cardiovascular: Negative for chest pain.  Gastrointestinal: Negative for nausea, vomiting and abdominal pain.  Musculoskeletal: Positive for back pain.  Neurological: Positive for weakness. Negative for dizziness and headaches.  Psychiatric/Behavioral: Negative for substance abuse. The patient is not nervous/anxious.     Blood pressure 125/71, pulse 85, temperature 98.1 F (36.7 C), temperature source Oral, resp. rate 18, height _0  (1.626 m), weight 98.431 kg (217 lb), SpO2 98.00%. Physical Exam  Constitutional: She appears well-developed.  Cardiovascular: Normal rate, regular rhythm and normal heart sounds.   No murmur heard. Respiratory: Effort normal and breath sounds normal. She  has no wheezes.  GI: Soft. Bowel sounds are normal. There is no tenderness.  Musculoskeletal: Normal range of motion.  Back pain  Neurological: She is alert.  Skin: Skin is warm and dry.  Psychiatric: She has a normal mood and affect. Her behavior is normal. Judgment and thought content normal.     Assessment/Plan Onset new back pain x few weeks New lesion at T10 per MRI and +PET Hx Breast Ca Scheduled for  T10 ablation and augmentation/bx Pt aware of procedure benefits and risks and agreeable to proceed Consent signed andin chart  Jamari Diana A 09/23/2013, 10:24 AM

## 2013-09-23 NOTE — Sedation Documentation (Signed)
500 cc fluid bag given for BP control

## 2013-09-23 NOTE — Discharge Instructions (Signed)
1.No stooping,bending or lifting more than 10 lbs for 2 weeks. 2.Use walker to ambulate for 2 weeks. 3.RTC in 2 weeks.  KYPHOPLASTY/VERTEBROPLASTY DISCHARGE INSTRUCTIONS  Medications: (check all that apply)     Resume all home medications as before procedure.       Resume your (aspirin/Plavix/Coumadin) on N/A                  Continue your pain medications as prescribed as needed.  Over the next 3-5 days, decrease your pain medication as tolerated.  Over the counter medications (i.e. Tylenol, ibuprofen, and aleve) may be substituted once severe/moderate pain symptoms have subsided.   Wound Care:   Bandages may be removed the day following your procedure.  You may get your incision wet once bandages are removed.  Bandaids may be used to cover the incisions until scab formation.  Topical ointments are optional.    If you develop a fever greater than 101 degrees, have increased skin redness at the incision sites or pus-like oozing from incisions occurring within 1 week of the procedure, contact radiology at [phone] or [phone].    Ice pack to back for 15-20 minutes 2-3 time per day for first 2-3 days post procedure.  The ice will expedite muscle healing and help with the pain from the incisions.   Activity:   Bedrest today with limited activity for 24 hours post procedure.    No driving for 48 hours.    Increase your activity as tolerated after bedrest (with assistance if necessary).    Refrain from any strenuous activity or heavy lifting (greater than 10 lbs.).   Follow up:   Contact radiology at [phone] or [phone] if any questions/concerns.    A physician assistant from radiology will contact you in approximately 1 week.    If a biopsy was performed at the time of your procedure, your referring physician should receive the results in usually 2-3 days.

## 2013-09-23 NOTE — Sedation Documentation (Signed)
Offered ice pack, refuses at this time due to being cold.  VSS, dressign CDI.  Pulse ox removed due to adhesive allergy per pt.  Adhesive remover used and cleansed with water afterwardss.  Taking po's.

## 2013-09-23 NOTE — Sedation Documentation (Signed)
Pt given emotional support. Pt in constant pain.

## 2013-11-07 ENCOUNTER — Telehealth: Payer: Self-pay | Admitting: *Deleted

## 2013-11-07 NOTE — Telephone Encounter (Signed)
The representative does need to be present at time of MRI. To check ppm before and after.

## 2013-11-07 NOTE — Telephone Encounter (Signed)
Radiologist from [facility] is saying that a representative from [employer] would need to be present for the MRI of the patient's knee in order for them to do it at [facility]. Per Ashlee Moore, director of [facility], the radiologists states the only place that does MRI's for patient's with PPM's is in [facility]. Nurse advised director that as long as its been 6 weeks, she should be okay to proceed with the MRI. Please advise if this patient's device need to be turned off for MRI or if a rep from [employer] need to be present when the MRI is done?

## 2013-11-07 NOTE — Telephone Encounter (Signed)
Agreed  If additional information is required, please direct them to Stevenson with [employer].  She can be reached at [phone]

## 2013-11-08 NOTE — Telephone Encounter (Signed)
Patient and Ashlee Moore at [facility] informed of the below information.

## 2013-11-22 ENCOUNTER — Encounter: Payer: Self-pay | Admitting: Internal Medicine

## 2013-12-02 ENCOUNTER — Encounter: Payer: Self-pay | Admitting: Internal Medicine

## 2013-12-21 ENCOUNTER — Other Ambulatory Visit: Payer: Self-pay | Admitting: Radiation Oncology

## 2013-12-21 DIAGNOSIS — C7951 Secondary malignant neoplasm of bone: Secondary | ICD-10-CM

## 2014-01-09 ENCOUNTER — Ambulatory Visit (HOSPITAL_COMMUNITY)
Admission: RE | Admit: 2014-01-09 | Discharge: 2014-01-09 | Disposition: A | Discharge status: Home / Self Care | Payer: Medicare Other | Source: Ambulatory Visit | Attending: Radiation Oncology | Admitting: Radiation Oncology

## 2014-01-09 ENCOUNTER — Ambulatory Visit (HOSPITAL_COMMUNITY): Admission: RE | Admit: 2014-01-09 | Payer: Medicare Other | Source: Ambulatory Visit

## 2014-01-26 ENCOUNTER — Ambulatory Visit (HOSPITAL_COMMUNITY)
Admission: RE | Admit: 2014-01-26 | Discharge: 2014-01-26 | Disposition: A | Discharge status: Home / Self Care | Payer: Medicare Other | Source: Ambulatory Visit | Attending: Radiation Oncology | Admitting: Radiation Oncology

## 2014-01-26 ENCOUNTER — Ambulatory Visit (HOSPITAL_COMMUNITY): Admission: RE | Admit: 2014-01-26 | Payer: Medicare Other | Source: Ambulatory Visit

## 2014-01-26 DIAGNOSIS — M545 Low back pain: Secondary | ICD-10-CM | POA: Insufficient documentation

## 2014-01-26 DIAGNOSIS — C7951 Secondary malignant neoplasm of bone: Secondary | ICD-10-CM

## 2014-01-26 DIAGNOSIS — M47894 Other spondylosis, thoracic region: Secondary | ICD-10-CM | POA: Diagnosis not present

## 2014-01-26 DIAGNOSIS — D259 Leiomyoma of uterus, unspecified: Secondary | ICD-10-CM | POA: Insufficient documentation

## 2014-01-26 DIAGNOSIS — D1809 Hemangioma of other sites: Secondary | ICD-10-CM | POA: Insufficient documentation

## 2014-01-26 DIAGNOSIS — M5124 Other intervertebral disc displacement, thoracic region: Secondary | ICD-10-CM | POA: Diagnosis not present

## 2014-01-26 DIAGNOSIS — M2578 Osteophyte, vertebrae: Secondary | ICD-10-CM | POA: Insufficient documentation

## 2014-01-26 DIAGNOSIS — C50919 Malignant neoplasm of unspecified site of unspecified female breast: Secondary | ICD-10-CM | POA: Diagnosis present

## 2014-01-26 DIAGNOSIS — M5125 Other intervertebral disc displacement, thoracolumbar region: Secondary | ICD-10-CM | POA: Insufficient documentation

## 2014-01-26 DIAGNOSIS — M47896 Other spondylosis, lumbar region: Secondary | ICD-10-CM | POA: Diagnosis not present

## 2014-01-26 DIAGNOSIS — M8938 Hypertrophy of bone, other site: Secondary | ICD-10-CM | POA: Insufficient documentation

## 2014-01-26 LAB — CREATININE, SERUM
Creatinine, Ser: 0.69 mg/dL (ref 0.50–1.10)
GFR calc Af Amer: >90 mL/min (ref >90)

## 2014-01-26 MED ORDER — GADOBENATE DIMEGLUMINE 529 MG/ML IV SOLN
20.0000 mL | Freq: Once | INTRAVENOUS | Status: AC
  Administered 2014-01-26: 20 mL via INTRAVENOUS

## 2014-01-26 NOTE — Progress Notes (Signed)
MRI done, monitor sinus rhythm and pacer reprogrammed via Mosie Epstein with [employer]

## 2014-01-26 NOTE — Progress Notes (Addendum)
On moniter, heart rate 75 sinus rhythm.  Pacer placed in Standby by [name] with [employer], Patient monitored per Wyvonne Lenz RN for MRI procedure.

## 2014-02-15 ENCOUNTER — Encounter: Payer: Self-pay | Admitting: Internal Medicine

## 2014-02-17 ENCOUNTER — Encounter: Payer: Self-pay | Admitting: Internal Medicine

## 2014-02-17 ENCOUNTER — Ambulatory Visit (INDEPENDENT_AMBULATORY_CARE_PROVIDER_SITE_OTHER): Payer: Medicare Other | Admitting: Internal Medicine

## 2014-02-17 VITALS — BP 94/62 | HR 84 | Ht 64.0 in | Wt 205.0 lb

## 2014-02-17 DIAGNOSIS — C7951 Secondary malignant neoplasm of bone: Secondary | ICD-10-CM

## 2014-02-17 DIAGNOSIS — Z6841 Body Mass Index (BMI) 40.0 and over, adult: Secondary | ICD-10-CM

## 2014-02-17 DIAGNOSIS — C50919 Malignant neoplasm of unspecified site of unspecified female breast: Secondary | ICD-10-CM

## 2014-02-17 DIAGNOSIS — C50911 Malignant neoplasm of unspecified site of right female breast: Secondary | ICD-10-CM

## 2014-02-17 DIAGNOSIS — I441 Atrioventricular block, second degree: Secondary | ICD-10-CM

## 2014-02-17 LAB — MDC_IDC_ENUM_SESS_TYPE_INCLINIC
Brady Statistic AP VP Percent: 0 %
Brady Statistic AS VP Percent: 0.04 %
Brady Statistic RA Percent Paced: 0.09 %
Brady Statistic RV Percent Paced: 0.04 %
Date Time Interrogation Session: 20160108150612
Lead Channel Impedance Value: 520 Ohm
Lead Channel Pacing Threshold Pulse Width: 0.4 ms
Lead Channel Sensing Intrinsic Amplitude: 3.8861
Lead Channel Setting Pacing Amplitude: 2 V
Lead Channel Setting Pacing Amplitude: 2.5 V
Lead Channel Setting Pacing Pulse Width: 0.4 ms
Lead Channel Setting Sensing Sensitivity: 0.9 mV
MDC IDC MSMT BATTERY VOLTAGE: 2.99 V
MDC IDC MSMT LEADCHNL RA PACING THRESHOLD AMPLITUDE: 0.5 V
MDC IDC MSMT LEADCHNL RA SENSING INTR AMPL: 3.6369
MDC IDC MSMT LEADCHNL RV IMPEDANCE VALUE: 496 Ohm
MDC IDC MSMT LEADCHNL RV PACING THRESHOLD AMPLITUDE: 1 V
MDC IDC MSMT LEADCHNL RV PACING THRESHOLD PULSEWIDTH: 0.4 ms
MDC IDC SET ZONE DETECTION INTERVAL: 350 ms
MDC IDC STAT BRADY AP VS PERCENT: 0.09 %
MDC IDC STAT BRADY AS VS PERCENT: 99.87 %
Zone Setting Detection Interval: 400 ms

## 2014-02-17 NOTE — Patient Instructions (Signed)
Your physician recommends that you schedule a follow-up appointment in: 1 year with Dr. Rayann Heman. You will receive a reminder letter in the mail in about 10 months reminding you to call and schedule your appointment. If you don't receive this letter, please contact our office. Carelink device check 05/18/14. Your physician has recommended you make the following change in your medication:  Stop furosemide. Continue all other medications the same.

## 2014-02-17 NOTE — Progress Notes (Signed)
PCP: Deloria Lair, MD  Ashlee Moore is a 54 y.o. female who presents today for routine electrophysiology followup.  She has metastatic breast cancer but seems to be doing recently well.  On 12/26, she had an episode of tachypalpitations lasting several minutes.  This followed use of an OTC nasal preparation for congestion.  She has stopped using this medicine and has had no further episodes. Her edema is stable.  Today, she denies symptoms of chest pain, shortness of breath,  dizziness, presyncope, or syncope.  The patient is otherwise without complaint today.   Past Medical History  Diagnosis Date  . Bipolar disorder   . Fibromyalgia   . Anemia   . Elevated LFTs     hx  . Arthritis   . Osteoporosis   . Chills   . Fever   . Generalized headaches   . Weakness   . Easy bruising   . Pacemaker   . GERD (gastroesophageal reflux disease)   . Sleep apnea     STOP BANG SCORE 4  . Cancer     right breast/LAST CHEOM 10/07/11  . Morbid obesity with body mass index of 40.0-44.9 in adult 10/22/2011    BMI 44.26    . Hypertension   . AV block, Mobitz 2 11/14/09    s/p MDT Revo PPM by JA   Past Surgical History  Procedure Laterality Date  . Ectopic pregnancy surgery      x2  . Bilateral knee arthroscopy    . Gastric bypass surgery    . Plastic surgery on face      for ptosis of rt. eyelid  . Left ovary and fallopian tube removed due to chronic pain    . Ppm  11/14/09    MDT Revo implanted by Dr Rayann Heman for syncope/ Mobitz II AV block  . Pacemaker insertion  2011  . Cholecystectomy    . Portacath placement      power port - right  . Mastectomy  2013    bilateral  . Breast surgery  2012,12    lumpectomy,mastectomy bil  . Multiple extractions with alveoloplasty N/A 06/04/2012    Procedure: MULTIPLE EXTRACION WITH ALVEOLOPLASTY EXTRACT: 4, 5, 8, 9, 10, 13, 14 ,28;  Surgeon: Gae Bon, DDS;  Location: [facility];  Service: Oral Surgery;  Laterality: N/A;    Current Outpatient  Prescriptions  Medication Sig Dispense Refill  . anastrozole (ARIMIDEX) 1 MG tablet Take 1 mg by mouth at bedtime.     Marland Kitchen aspirin 81 MG EC tablet Take 81 mg by mouth daily.     . Calcium Carbonate-Vitamin D 600-400 MG-UNIT per tablet Take 1 tablet by mouth daily.     . clonazePAM (KLONOPIN) 1 MG tablet Take 1 mg by mouth 2 (two) times daily as needed for anxiety.     . clotrimazole-betamethasone (LOTRISONE) cream Apply 1 application topically 2 (two) times daily as needed (itching).     . cyclobenzaprine (FLEXERIL) 10 MG tablet Take 10 mg by mouth at bedtime. Muscle spasm    . diclofenac sodium (VOLTAREN) 1 % GEL Place 2 g onto the skin 3 (three) times daily as needed (arthritis).     . DULoxetine (CYMBALTA) 60 MG capsule Take 60 mg by mouth every evening.     . fentaNYL (DURAGESIC - DOSED MCG/HR) 100 MCG/HR Place 300 mcg onto the skin every 3 (three) days.     . furosemide (LASIX) 40 MG tablet Take 40 mg by mouth daily.    Marland Kitchen  hydroxypropyl methylcellulose (ISOPTO TEARS) 2.5 % ophthalmic solution Place 1 drop into both eyes 4 (four) times daily as needed (dry eyes).    Marland Kitchen lidocaine-prilocaine (EMLA) cream Apply 1 application topically as needed (prior to chemo). For port (2.5%/2/5%)    . Multiple Vitamin (THERA) TABS Take 1 tablet by mouth at bedtime.     Marland Kitchen omeprazole (PRILOSEC) 20 MG capsule Take 20 mg by mouth at bedtime.     . Oxycodone HCl 20 MG TABS Take 40 mg by mouth every 3 (three) hours.     . potassium chloride SA (K-DUR,KLOR-CON) 20 MEQ tablet Take 20 mEq by mouth daily.     . pregabalin (LYRICA) 100 MG capsule Take 100 mg by mouth 2 (two) times daily.    . promethazine (PHENERGAN) 25 MG tablet Take 25 mg by mouth every 8 (eight) hours as needed for nausea.     Marland Kitchen spironolactone (ALDACTONE) 25 MG tablet Take 25 mg by mouth 2 (two) times daily.    Marland Kitchen triamcinolone (KENALOG) 0.1 % paste Place 1 application onto teeth 2 (two) times daily as needed (affected area on gums or mouth.). Apply to  gums or mouth.    . triamterene-hydrochlorothiazide (DYAZIDE) 37.5-25 MG per capsule Take 1 capsule by mouth daily.     Marland Kitchen zolpidem (AMBIEN) 10 MG tablet Take 10-15 mg by mouth at bedtime.      No current facility-administered medications for this visit.    Physical Exam: Filed Vitals:   02/17/14 1059  BP: 94/62  Pulse: 84  Height: 5\' 4"  (1.626 m)  Weight: 205 lb (92.987 kg)    GEN- The patient is chronically ill appearing, alert and oriented x 3 today.   Head- normocephalic, atraumatic Eyes-  Sclera clear, conjunctiva pink Ears- hearing intact Oropharynx- clear Lungs- Clear to ausculation bilaterally, normal work of breathing Chest- pacemaker pocket is well healed  Heart- Regular rate and rhythm, no murmurs, rubs or gallops, PMI not laterally displaced GI- soft, NT, ND, + BS Extremities- no clubbing, cyanosis, or edema MS- ecchymosis over her dorsal L hand with mild swelling.  No warmth/ redness or pain.  ROM appears to be intact  Pacemaker interrogation- reviewed in detail today,  See PACEART report  Assessment and Plan:  1. Second degree AV block   Normal pacemaker function See Pace Art report No changes today  2. Breast cancer Stable    3. Obesity Stable No change required today  4. Palpitations Episode on 12/26 was a long RP tachycardia (likely sinus tach vs atrial tach) This was precipitated by OTC nasal decongestants.  I have encouraged her to avoid use  5. Hypotension No swelling on exam.  She appears dry Stop lasix  carelink Return to the device clinic in 12 months

## 2014-03-10 ENCOUNTER — Other Ambulatory Visit (HOSPITAL_COMMUNITY): Payer: Self-pay | Admitting: Internal Medicine

## 2014-03-10 DIAGNOSIS — C50911 Malignant neoplasm of unspecified site of right female breast: Secondary | ICD-10-CM

## 2014-03-10 DIAGNOSIS — C7952 Secondary malignant neoplasm of bone marrow: Secondary | ICD-10-CM

## 2014-03-10 DIAGNOSIS — R519 Headache, unspecified: Secondary | ICD-10-CM

## 2014-03-10 DIAGNOSIS — H538 Other visual disturbances: Secondary | ICD-10-CM

## 2014-03-10 DIAGNOSIS — R51 Headache: Secondary | ICD-10-CM

## 2014-03-10 DIAGNOSIS — C7951 Secondary malignant neoplasm of bone: Secondary | ICD-10-CM

## 2014-03-28 ENCOUNTER — Ambulatory Visit (HOSPITAL_COMMUNITY)
Admission: RE | Admit: 2014-03-28 | Discharge: 2014-03-28 | Disposition: A | Discharge status: Home / Self Care | Payer: Medicare Other | Source: Ambulatory Visit | Attending: Internal Medicine | Admitting: Internal Medicine

## 2014-03-28 DIAGNOSIS — R51 Headache: Secondary | ICD-10-CM | POA: Diagnosis not present

## 2014-03-28 DIAGNOSIS — C50911 Malignant neoplasm of unspecified site of right female breast: Secondary | ICD-10-CM | POA: Diagnosis not present

## 2014-03-28 DIAGNOSIS — C7952 Secondary malignant neoplasm of bone marrow: Secondary | ICD-10-CM

## 2014-03-28 DIAGNOSIS — C7951 Secondary malignant neoplasm of bone: Secondary | ICD-10-CM | POA: Insufficient documentation

## 2014-03-28 DIAGNOSIS — H538 Other visual disturbances: Secondary | ICD-10-CM | POA: Insufficient documentation

## 2014-03-28 DIAGNOSIS — R519 Headache, unspecified: Secondary | ICD-10-CM

## 2014-03-28 LAB — POCT I-STAT CREATININE: Creatinine, Ser: 0.9 mg/dL (ref 0.50–1.10)

## 2014-03-28 MED ORDER — GADOBENATE DIMEGLUMINE 529 MG/ML IV SOLN
15.0000 mL | Freq: Once | INTRAVENOUS | Status: AC | PRN
  Administered 2014-03-28: 15 mL via INTRAVENOUS

## 2014-05-18 ENCOUNTER — Encounter: Payer: Medicare Other | Admitting: *Deleted

## 2014-05-18 ENCOUNTER — Telehealth: Payer: Self-pay | Admitting: Internal Medicine

## 2014-05-18 NOTE — Telephone Encounter (Signed)
Spoke w/ pt and ordered her a new home monitor b/c she said she did not have one. Pt will send transmission once new monitor is ordered.

## 2014-05-18 NOTE — Telephone Encounter (Signed)
New Message  Pt sending remote signal for the first time today- pt having issues finding 1800 number. Please call back and discuss.

## 2014-07-13 ENCOUNTER — Other Ambulatory Visit (HOSPITAL_COMMUNITY): Payer: Self-pay | Admitting: Internal Medicine

## 2014-07-13 DIAGNOSIS — C50919 Malignant neoplasm of unspecified site of unspecified female breast: Secondary | ICD-10-CM

## 2014-07-21 ENCOUNTER — Ambulatory Visit (HOSPITAL_COMMUNITY): Payer: Medicare Other

## 2014-07-25 ENCOUNTER — Ambulatory Visit (HOSPITAL_COMMUNITY): Payer: Medicare Other

## 2014-07-31 ENCOUNTER — Encounter (HOSPITAL_COMMUNITY)
Admission: RE | Admit: 2014-07-31 | Discharge: 2014-07-31 | Disposition: A | Discharge status: Home / Self Care | Payer: Medicare Other | Source: Ambulatory Visit | Attending: Internal Medicine | Admitting: Internal Medicine

## 2014-07-31 DIAGNOSIS — Z79899 Other long term (current) drug therapy: Secondary | ICD-10-CM | POA: Insufficient documentation

## 2014-07-31 DIAGNOSIS — C50919 Malignant neoplasm of unspecified site of unspecified female breast: Secondary | ICD-10-CM | POA: Diagnosis present

## 2014-07-31 LAB — GLUCOSE, CAPILLARY: Glucose-Capillary: 107 mg/dL — ABNORMAL HIGH (ref 65–99)

## 2014-07-31 MED ORDER — FLUDEOXYGLUCOSE F - 18 (FDG) INJECTION
10.1000 | Freq: Once | INTRAVENOUS | Status: AC | PRN
  Administered 2014-07-31: 10.1 via INTRAVENOUS

## 2015-02-16 ENCOUNTER — Encounter: Payer: Medicare Other | Admitting: Internal Medicine

## 2015-02-22 ENCOUNTER — Other Ambulatory Visit (HOSPITAL_COMMUNITY): Payer: Self-pay | Admitting: Internal Medicine

## 2015-02-22 DIAGNOSIS — C50929 Malignant neoplasm of unspecified site of unspecified male breast: Secondary | ICD-10-CM

## 2015-03-22 ENCOUNTER — Encounter (HOSPITAL_COMMUNITY): Payer: Medicare Other

## 2015-03-22 ENCOUNTER — Ambulatory Visit (HOSPITAL_COMMUNITY): Admission: RE | Admit: 2015-03-22 | Payer: Medicare Other | Source: Ambulatory Visit

## 2015-04-04 ENCOUNTER — Ambulatory Visit (HOSPITAL_COMMUNITY)
Admission: RE | Admit: 2015-04-04 | Discharge: 2015-04-04 | Disposition: A | Discharge status: Home / Self Care | Payer: Medicare Other | Source: Ambulatory Visit | Attending: Internal Medicine | Admitting: Internal Medicine

## 2015-04-04 DIAGNOSIS — F1190 Opioid use, unspecified, uncomplicated: Secondary | ICD-10-CM | POA: Insufficient documentation

## 2015-04-04 DIAGNOSIS — C7951 Secondary malignant neoplasm of bone: Secondary | ICD-10-CM | POA: Diagnosis not present

## 2015-04-04 DIAGNOSIS — E876 Hypokalemia: Secondary | ICD-10-CM | POA: Insufficient documentation

## 2015-04-04 DIAGNOSIS — C771 Secondary and unspecified malignant neoplasm of intrathoracic lymph nodes: Secondary | ICD-10-CM | POA: Diagnosis not present

## 2015-04-04 DIAGNOSIS — C50929 Malignant neoplasm of unspecified site of unspecified male breast: Secondary | ICD-10-CM

## 2015-04-04 DIAGNOSIS — G893 Neoplasm related pain (acute) (chronic): Secondary | ICD-10-CM | POA: Insufficient documentation

## 2015-04-04 DIAGNOSIS — C50911 Malignant neoplasm of unspecified site of right female breast: Secondary | ICD-10-CM | POA: Insufficient documentation

## 2015-04-04 DIAGNOSIS — M8468XS Pathological fracture in other disease, other site, sequela: Secondary | ICD-10-CM | POA: Insufficient documentation

## 2015-04-04 DIAGNOSIS — Z9013 Acquired absence of bilateral breasts and nipples: Secondary | ICD-10-CM | POA: Diagnosis not present

## 2015-04-04 LAB — GLUCOSE, CAPILLARY: GLUCOSE-CAPILLARY: 112 mg/dL — AB (ref 65–99)

## 2015-04-04 MED ORDER — FLUDEOXYGLUCOSE F - 18 (FDG) INJECTION
10.2600 | Freq: Once | INTRAVENOUS | Status: AC | PRN
  Administered 2015-04-04: 10.26 via INTRAVENOUS

## 2015-04-10 ENCOUNTER — Encounter: Payer: Self-pay | Admitting: *Deleted

## 2015-04-10 ENCOUNTER — Encounter: Payer: Self-pay | Admitting: Internal Medicine

## 2015-04-10 ENCOUNTER — Ambulatory Visit (INDEPENDENT_AMBULATORY_CARE_PROVIDER_SITE_OTHER): Payer: Medicare Other | Admitting: Internal Medicine

## 2015-04-10 VITALS — BP 118/80 | HR 92 | Ht 64.0 in | Wt 184.0 lb

## 2015-04-10 DIAGNOSIS — Z6841 Body Mass Index (BMI) 40.0 and over, adult: Secondary | ICD-10-CM | POA: Diagnosis not present

## 2015-04-10 DIAGNOSIS — I441 Atrioventricular block, second degree: Secondary | ICD-10-CM | POA: Diagnosis not present

## 2015-04-10 DIAGNOSIS — Z5111 Encounter for antineoplastic chemotherapy: Secondary | ICD-10-CM

## 2015-04-10 LAB — CUP PACEART INCLINIC DEVICE CHECK
Battery Voltage: 2.97 V
Brady Statistic AP VP Percent: 0.02 %
Brady Statistic AS VS Percent: 99.14 %
Date Time Interrogation Session: 20170228162053
Implantable Lead Implant Date: 20111005
Implantable Lead Location: 753860
Implantable Lead Model: 5086
Lead Channel Impedance Value: 456 Ohm
Lead Channel Pacing Threshold Amplitude: 1 V
Lead Channel Sensing Intrinsic Amplitude: 2.04 mV
Lead Channel Setting Pacing Amplitude: 2 V
Lead Channel Setting Pacing Amplitude: 2.5 V
Lead Channel Setting Pacing Pulse Width: 0.4 ms
MDC IDC LEAD IMPLANT DT: 20111005
MDC IDC LEAD LOCATION: 753859
MDC IDC LEAD MODEL: 5086
MDC IDC MSMT LEADCHNL RA PACING THRESHOLD PULSEWIDTH: 0.4 ms
MDC IDC MSMT LEADCHNL RV IMPEDANCE VALUE: 416 Ohm
MDC IDC MSMT LEADCHNL RV PACING THRESHOLD AMPLITUDE: 1 V
MDC IDC MSMT LEADCHNL RV PACING THRESHOLD PULSEWIDTH: 0.4 ms
MDC IDC MSMT LEADCHNL RV SENSING INTR AMPL: 3.886 mV
MDC IDC SET LEADCHNL RV SENSING SENSITIVITY: 0.9 mV
MDC IDC STAT BRADY AP VS PERCENT: 0.75 %
MDC IDC STAT BRADY AS VP PERCENT: 0.1 %
MDC IDC STAT BRADY RA PERCENT PACED: 0.77 %
MDC IDC STAT BRADY RV PERCENT PACED: 0.12 %

## 2015-04-10 NOTE — Patient Instructions (Signed)
Your physician recommends that you continue on your current medications as directed. Please refer to the Current Medication list given to you today. Device check 07/10/15. Your physician has requested that you have an echocardiogram at our office when its due again. We will let you know when this is due. Echocardiography is a painless test that uses sound waves to create images of your heart. It provides your doctor with information about the size and shape of your heart and how well your heart's chambers and valves are working. This procedure takes approximately one hour. There are no restrictions for this procedure. Your physician recommends that you schedule a follow-up appointment in: 1 year with Dr. Rayann Heman. Please schedule this appointment today.

## 2015-04-10 NOTE — Progress Notes (Signed)
PCP: Deloria Lair, MD  Ashlee Moore is a 55 y.o. female who presents today for routine electrophysiology followup.  She has metastatic breast cancer but seems to be doing recently well.  She has is planned for knee surgery in May.  She is not very active.   She has rare palpitations.  She also has "spells" where she becomes weak and in minimally interactive.  She does not (per family member) have full syncope as she is able to converse but becomes very lethargic.  Her edema is stable.  Today, she denies symptoms of chest pain, shortness of breath,  dizziness, presyncope, or syncope.  The patient is otherwise without complaint today.   Past Medical History  Diagnosis Date  . Bipolar disorder ([place])   . Fibromyalgia   . Anemia   . Elevated LFTs     hx  . Arthritis   . Osteoporosis   . Chills   . Fever   . Generalized headaches   . Weakness   . Easy bruising   . Pacemaker   . GERD (gastroesophageal reflux disease)   . Sleep apnea     STOP BANG SCORE 4  . Cancer ([place])     right breast/LAST CHEOM 10/07/11  . Morbid obesity with body mass index of 40.0-44.9 in adult ([place]) 10/22/2011    BMI 44.26    . Hypertension   . AV block, Mobitz 2 11/14/09    s/p MDT Revo PPM by JA   Past Surgical History  Procedure Laterality Date  . Ectopic pregnancy surgery      x2  . Bilateral knee arthroscopy    . Gastric bypass surgery    . Plastic surgery on face      for ptosis of rt. eyelid  . Left ovary and fallopian tube removed due to chronic pain    . Ppm  11/14/09    MDT Revo implanted by Dr Rayann Heman for syncope/ Mobitz II AV block  . Pacemaker insertion  2011  . Cholecystectomy    . Portacath placement      power port - right  . Mastectomy  2013    bilateral  . Breast surgery  2012,12    lumpectomy,mastectomy bil  . Multiple extractions with alveoloplasty N/A 06/04/2012    Procedure: MULTIPLE EXTRACION WITH ALVEOLOPLASTY EXTRACT: 4, 5, 8, 9, 10, 13, 14 ,28;  Surgeon: Gae Bon, DDS;   Location: [facility];  Service: Oral Surgery;  Laterality: N/A;    Current Outpatient Prescriptions  Medication Sig Dispense Refill  . aspirin 81 MG EC tablet Take 81 mg by mouth daily.     . Calcium Carbonate-Vitamin D 600-400 MG-UNIT per tablet Take 1 tablet by mouth daily.     . clonazePAM (KLONOPIN) 1 MG tablet Take 1 mg by mouth 2 (two) times daily as needed for anxiety.     . clotrimazole-betamethasone (LOTRISONE) cream Apply 1 application topically 2 (two) times daily as needed (itching).     . cyclobenzaprine (FLEXERIL) 10 MG tablet Take 10 mg by mouth at bedtime. Muscle spasm    . diclofenac sodium (VOLTAREN) 1 % GEL Place 2 g onto the skin 3 (three) times daily as needed (arthritis).     . DULoxetine (CYMBALTA) 60 MG capsule Take 60 mg by mouth every evening.     . fentaNYL (DURAGESIC - DOSED MCG/HR) 100 MCG/HR Place 300 mcg onto the skin every 3 (three) days.     . hydroxypropyl methylcellulose (ISOPTO TEARS) 2.5 %  ophthalmic solution Place 1 drop into both eyes 4 (four) times daily as needed (dry eyes).    Marland Kitchen lidocaine-prilocaine (EMLA) cream Apply 1 application topically as needed (prior to chemo). For port (2.5%/2/5%)    . Multiple Vitamin (THERA) TABS Take 1 tablet by mouth at bedtime.     Marland Kitchen omeprazole (PRILOSEC) 20 MG capsule Take 20 mg by mouth at bedtime.     . Oxycodone HCl 20 MG TABS Take 40 mg by mouth every 3 (three) hours.     . potassium chloride SA (K-DUR,KLOR-CON) 20 MEQ tablet Take 20 mEq by mouth daily.     . pregabalin (LYRICA) 100 MG capsule Take 100 mg by mouth 2 (two) times daily.    . promethazine (PHENERGAN) 25 MG tablet Take 25 mg by mouth every 8 (eight) hours as needed for nausea.     Marland Kitchen spironolactone (ALDACTONE) 25 MG tablet Take 25 mg by mouth 2 (two) times daily.    Marland Kitchen triamcinolone (KENALOG) 0.1 % paste Place 1 application onto teeth 2 (two) times daily as needed (affected area on gums or mouth.). Apply to gums or mouth.    . zolpidem (AMBIEN) 10 MG tablet  Take 10-15 mg by mouth at bedtime.      No current facility-administered medications for this visit.    Physical Exam: Filed Vitals:   04/10/15 1209  BP: 118/80  Pulse: 92  Height: '5\' 4"'$  (1.626 m)  Weight: 184 lb (83.462 kg)  SpO2: 94%    GEN- The patient is chronically ill appearing, alert and oriented x 3 today.   Head- normocephalic, atraumatic Eyes-  Sclera clear, conjunctiva pink Ears- hearing intact Oropharynx- clear Lungs- Clear to ausculation bilaterally, normal work of breathing Chest- pacemaker pocket is well healed  Heart- Regular rate and rhythm, no murmurs, rubs or gallops, PMI not laterally displaced GI- soft, NT, ND, + BS Extremities- no clubbing, cyanosis, or edema MS- walks slowly with a cane  Pacemaker interrogation- reviewed in detail today,  See PACEART report  Assessment and Plan:  1. Second degree AV block   Normal pacemaker function See Pace Art report No changes today  2. Breast cancer Stable    3. Obesity Stable No change required today  4. Palpitations No recent arrhythmias No arrhythmias to have caused her "spells" I worry that this could be due to pain medicine or possibly chemotherapy She has echos every 3 months she says be another cardiology group I will try to obtain this  5. preop Would need myoview (lexiscan) prior to knee surgery as she is inactive  carelink Return to the device clinic in 12 months  Thompson Grayer MD, [facility] 04/10/2015 12:26 PM

## 2015-04-13 ENCOUNTER — Encounter: Payer: Medicare Other | Admitting: Internal Medicine

## 2015-04-26 ENCOUNTER — Other Ambulatory Visit: Payer: Self-pay

## 2015-04-26 ENCOUNTER — Ambulatory Visit (INDEPENDENT_AMBULATORY_CARE_PROVIDER_SITE_OTHER): Payer: Medicare Other

## 2015-04-26 DIAGNOSIS — I441 Atrioventricular block, second degree: Secondary | ICD-10-CM | POA: Diagnosis not present

## 2015-04-26 DIAGNOSIS — Z5111 Encounter for antineoplastic chemotherapy: Secondary | ICD-10-CM

## 2015-04-26 DIAGNOSIS — Z6841 Body Mass Index (BMI) 40.0 and over, adult: Secondary | ICD-10-CM

## 2015-04-27 ENCOUNTER — Telehealth: Payer: Self-pay | Admitting: *Deleted

## 2015-05-02 NOTE — Telephone Encounter (Signed)
Notes Recorded by Thompson Grayer, MD on 04/26/2015 at 4:34 PM Results reviewed. Please inform pt of result.

## 2015-05-02 NOTE — Telephone Encounter (Signed)
Patient informed. 

## 2015-05-21 ENCOUNTER — Telehealth: Payer: Self-pay | Admitting: Internal Medicine

## 2015-05-21 ENCOUNTER — Telehealth: Payer: Self-pay | Admitting: *Deleted

## 2015-05-21 DIAGNOSIS — I441 Atrioventricular block, second degree: Secondary | ICD-10-CM

## 2015-05-21 NOTE — Telephone Encounter (Signed)
No precert required 

## 2015-05-21 NOTE — Telephone Encounter (Signed)
A surgical clearance request was received from Dr. Maurine Cane office for knew surgery. Per Dr. Rayann Heman, patient will need a lexiscan Cardiolite on medications dx: pre-op. Due to transportation issues, patient is requesting that the test be done at [facility].

## 2015-05-21 NOTE — Telephone Encounter (Signed)
A surgical clearance request was received from Dr. Maurine Cane office for knew surgery. Per Dr. Rayann Heman, patient will need a lexiscan Cardiolite on medications dx: pre-op. Due to transportation issues, patient is requesting that the test be done at [facility]. Scheduled April 17 arrive at 8:00am at [facility]  Faxed order to  [phone]

## 2015-05-23 ENCOUNTER — Other Ambulatory Visit: Payer: Self-pay | Admitting: *Deleted

## 2015-05-23 DIAGNOSIS — I441 Atrioventricular block, second degree: Secondary | ICD-10-CM

## 2015-06-11 ENCOUNTER — Telehealth: Payer: Self-pay | Admitting: *Deleted

## 2015-06-11 NOTE — Telephone Encounter (Signed)
Patient said she is still waiting to have her stress test scheduled at [facility]. Patient said she was given an appointment but someone called her back and said there was more information needed and that she would contacted by our office. Patient is scheduled for surgery on the 8th.

## 2015-06-12 NOTE — Telephone Encounter (Signed)
°  Patient was given appointment information For test to be done at [facility].  She was asked if she could do procedure at [facility] due to surgery on May 9th (per patient).   She declined due to transportation issues. Told me to book May 8th at [facility].  First available is Jun 18, 2015 at 7:30am arrive at 7:00am outpatient registration main entrance of [facility] after midnight no caffeine.

## 2015-06-12 NOTE — Telephone Encounter (Signed)
Spoke with patient explained that we will contact [place] to schedule appt once we have appt we will contact her back

## 2015-06-15 ENCOUNTER — Encounter: Payer: Self-pay | Admitting: *Deleted

## 2015-06-28 ENCOUNTER — Telehealth: Payer: Self-pay | Admitting: *Deleted

## 2015-06-28 NOTE — Telephone Encounter (Signed)
Patient informed and copy sent to Dr. Maurine Cane office.

## 2015-06-28 NOTE — Telephone Encounter (Signed)
Thompson Grayer, MD   SentEmmie Niemann Jun 27, 2015 10:34 PM    To: Merlene Laughter, LPN        Message     Low risk study.    Proceed with surgery if medically indicated.

## 2015-07-10 ENCOUNTER — Encounter: Payer: Medicare Other | Admitting: *Deleted

## 2015-07-10 ENCOUNTER — Telehealth: Payer: Self-pay | Admitting: Cardiology

## 2015-07-10 NOTE — Telephone Encounter (Signed)
Attempted to confirm remote transmission with pt. No answer and was unable to leave a message.   

## 2015-07-13 ENCOUNTER — Encounter: Payer: Self-pay | Admitting: Cardiology

## 2015-07-26 IMAGING — XA IR RFA BONE TUMOR
2 series · 14 of 24 positions shown · non-contrast
Comparison: none

CLINICAL DATA: Severe lower thoracic pain secondary to pathologic
compression fracture at T10.

[Series 16: myelo · 1 of 2 slices shown]
[im 1/2]
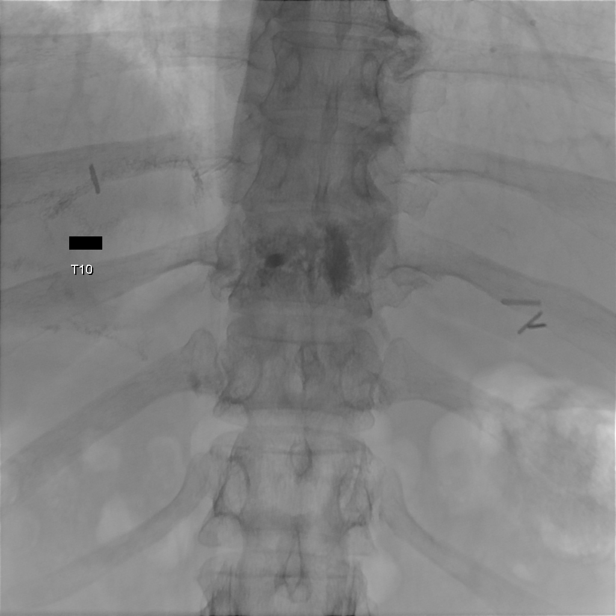

[Series 300: ir bone tumor rf ablation · 13 of 30 slices shown]
[im 1/30]
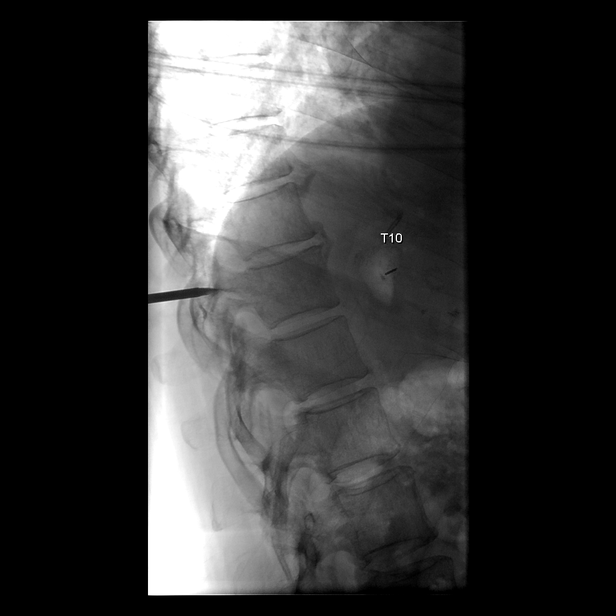
[im 3/30]
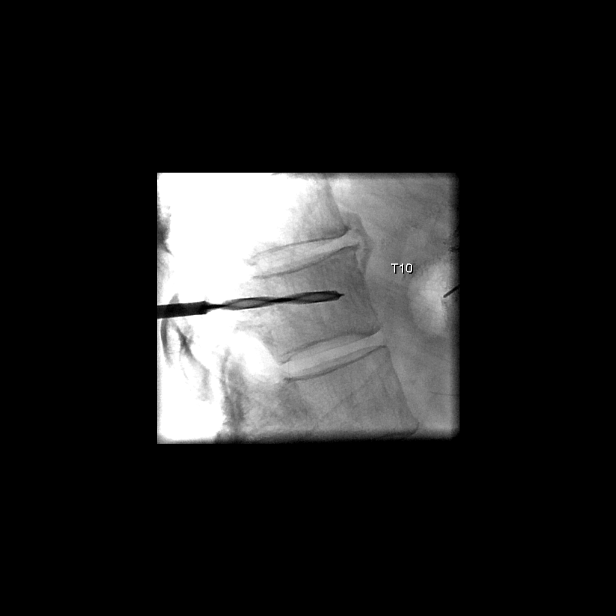
[im 6/30]
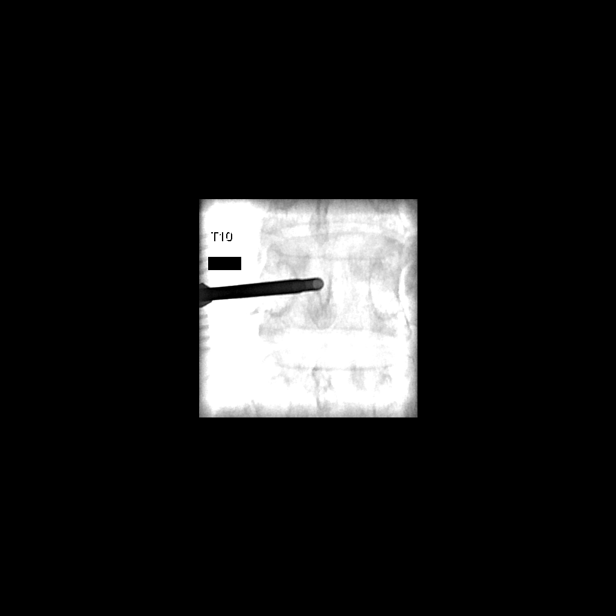
[im 7/30]
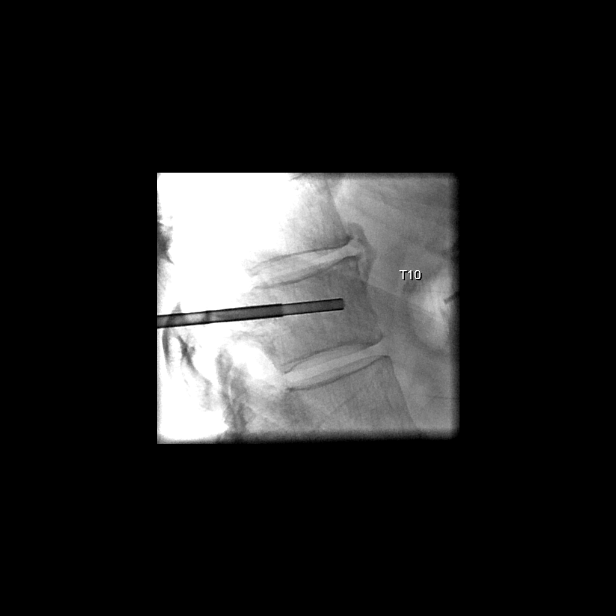
[im 10/30]
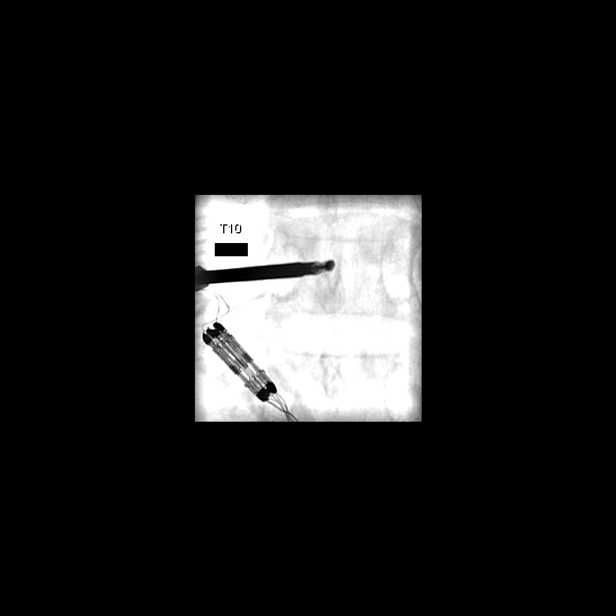
[im 13/30]
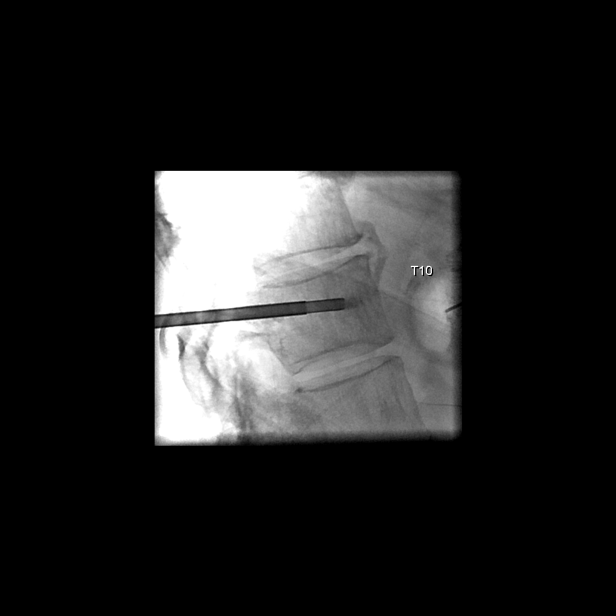
[im 14/30]
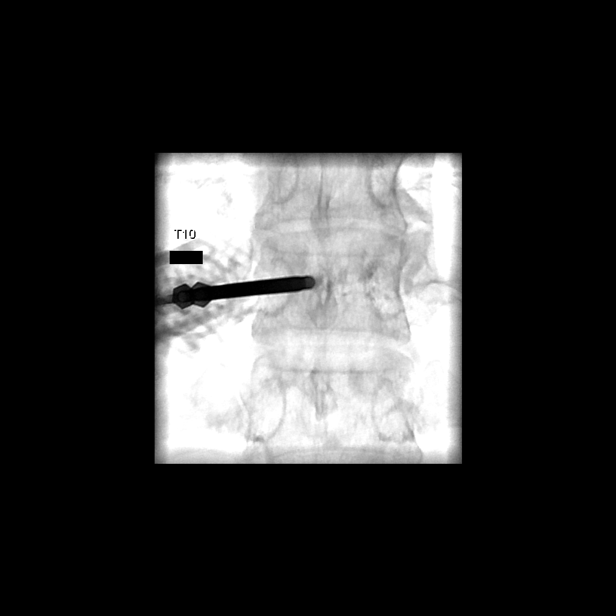
[im 17/30]
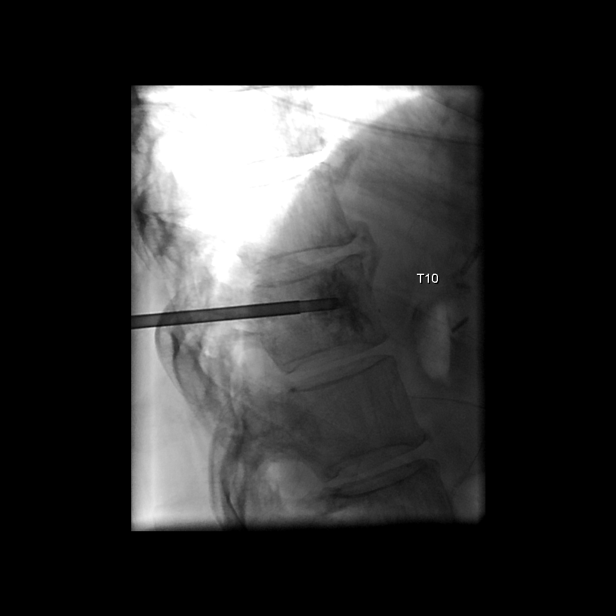
[im 20/30]
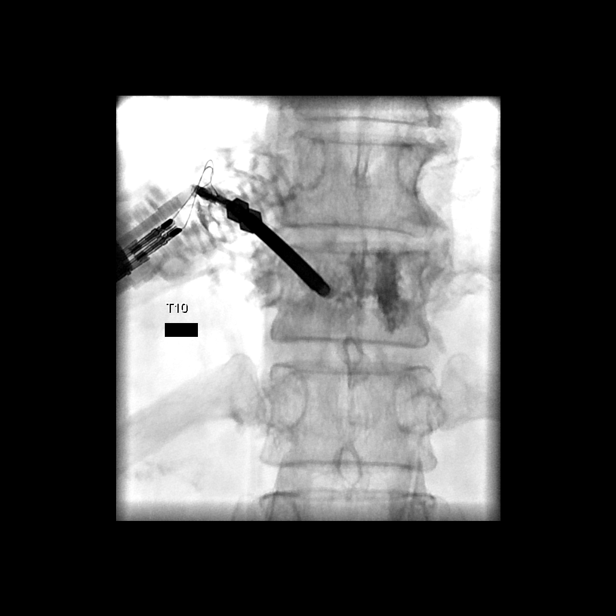
[im 23/30]
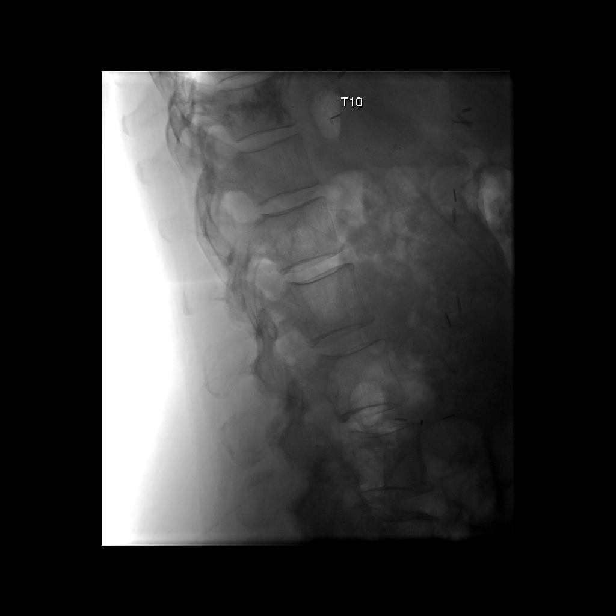
[im 24/30]
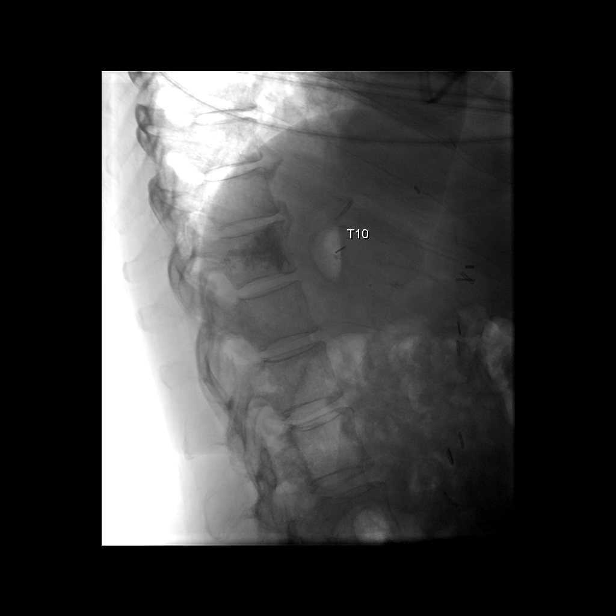
[im 27/30]
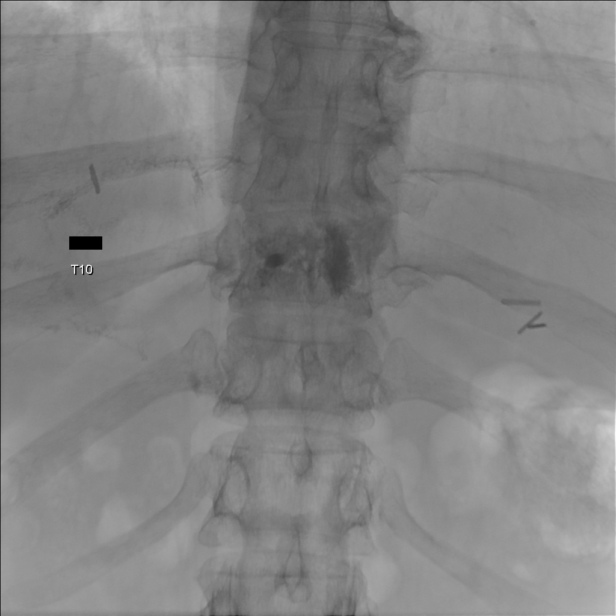
[im 30/30]
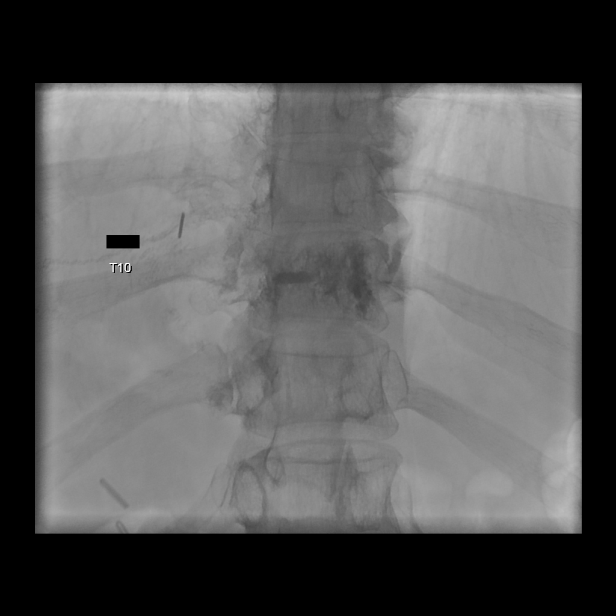

[14 of 24 positions shown; findings below may reference images not displayed]

EXAM:
RFA BONE TUMOR AT T10 FOLLOWED BY VERTEBRAL BODY AUGMENTATION:

ANESTHESIA/SEDATION:
Conscious sedation.

MEDICATIONS:
Versed 4 mg IV.  Fentanyl 125 mcg IV.  Dilaudid 4 mg IV.

CONTRAST:  None.

PROCEDURE:
Following a full explanation of the procedure along with the
potential associated complications, an informed witnessed consent
was obtained. However, the patient was placed prone on the
fluoroscopic table. The skin overlying the lower thoracic region was
then prepped and draped in the usual sterile fashion.

The skin overlying the left pedicle at T10 was then infiltrated with
0.25 percent bupivacaine. Using biplane intermittent fluoroscopy, a
10.5 gauge DFine spinal needle was then advanced into the posterior
one-third at T10. Through this, a DFine core biopsy device was then
advanced. Using biplane intermittent fluoroscopy, and a DFine core
biopsy device, a biopsy was then obtained and sent for pathologic
analysis.

Through the needle, a DFine void creating device was then advanced
and manipulated in different directions using biplane intermittent
fluoroscopy to create ablation. Total ablation time was of 4 minutes
and 25 seconds. Temperatures reached were 50 degrees C distally and
42 degrees proximally. The radiofrequency device was removed. The
needle was then advanced to the junction of the anterior and the
middle one-third.

Methylmethacrylate mixture was then reconstituted with tobramycin in
the DFine mixing system. This was then loaded onto the injector
device. The injector device was then locked into position at the hub
of the working needle.

Using biplane intermittent fluoroscopy, methylmethacrylate mixture
was then injected in a pulsed fashion into the T10 vertebral body.
Excellent filling was obtained contralaterally, and ipsilaterally at
the site of the ablation.

No extrusion was seen into the disc spaces or posteriorly into the
spinal canal. No paraspinous venous contamination was seen.

The injector device and the working needle were then retrieved and
removed. Hemostasis was achieved at the skin entry sites.

The patient tolerated the procedure well. There were no acute
complications.

COMPLICATIONS:
None immediate.
FINDINGS: As above.
IMPRESSION: Status post radiofrequency ablation for tumor ablation at T10
followed by vertebral body augmentation.

## 2015-08-02 ENCOUNTER — Other Ambulatory Visit (HOSPITAL_COMMUNITY): Payer: Self-pay | Admitting: Oncology

## 2015-08-02 DIAGNOSIS — C7951 Secondary malignant neoplasm of bone: Principal | ICD-10-CM

## 2015-08-02 DIAGNOSIS — C50919 Malignant neoplasm of unspecified site of unspecified female breast: Secondary | ICD-10-CM

## 2015-08-03 ENCOUNTER — Other Ambulatory Visit (HOSPITAL_COMMUNITY): Payer: Self-pay | Admitting: Oncology

## 2015-08-10 ENCOUNTER — Ambulatory Visit (HOSPITAL_COMMUNITY): Payer: Medicare Other

## 2015-09-07 ENCOUNTER — Ambulatory Visit (HOSPITAL_COMMUNITY): Payer: Medicare Other

## 2015-09-07 ENCOUNTER — Ambulatory Visit (HOSPITAL_COMMUNITY): Payer: Medicare Other | Admitting: Hematology & Oncology

## 2015-09-12 ENCOUNTER — Other Ambulatory Visit (HOSPITAL_COMMUNITY): Payer: Medicare Other

## 2015-11-09 ENCOUNTER — Ambulatory Visit (INDEPENDENT_AMBULATORY_CARE_PROVIDER_SITE_OTHER): Payer: Medicare Other | Admitting: Internal Medicine

## 2015-11-09 ENCOUNTER — Encounter: Payer: Self-pay | Admitting: Internal Medicine

## 2015-11-09 VITALS — BP 96/64 | HR 79 | Ht 65.0 in | Wt 167.0 lb

## 2015-11-09 DIAGNOSIS — C7951 Secondary malignant neoplasm of bone: Secondary | ICD-10-CM

## 2015-11-09 DIAGNOSIS — C50919 Malignant neoplasm of unspecified site of unspecified female breast: Secondary | ICD-10-CM | POA: Diagnosis not present

## 2015-11-09 DIAGNOSIS — I441 Atrioventricular block, second degree: Secondary | ICD-10-CM

## 2015-11-09 DIAGNOSIS — Z6841 Body Mass Index (BMI) 40.0 and over, adult: Secondary | ICD-10-CM

## 2015-11-09 NOTE — Patient Instructions (Addendum)
Medication Instructions:  Continue all current medications.  Labwork: none  Testing/Procedures: none  Follow-Up: Your physician wants you to follow up in:  1 year.  You will receive a reminder letter in the mail one-two months in advance.  If you don't receive a letter, please call our office to schedule the follow up appointment   Any Other Special Instructions Will Be Listed Below (If Applicable). Remote monitoring is used to monitor your Pacemaker of ICD from home. This monitoring reduces the number of office visits required to check your device to one time per year. It allows [place] to keep an eye on the functioning of your device to ensure it is working properly. You are scheduled for a device check from home on 02/12/2016. You may send your transmission at any time that day. If you have a wireless device, the transmission will be sent automatically. After your physician reviews your transmission, you will receive a postcard with your next transmission date.  If you need a refill on your cardiac medications before your next appointment, please call your pharmacy.

## 2015-11-09 NOTE — Progress Notes (Signed)
PCP: Deloria Lair, MD  Holley Raring Ashlee Moore is a 55 y.o. female who presents today for routine electrophysiology followup.  She has metastatic breast cancer and is receiving treatment in [place].  Her edema is stable.  Today, she denies symptoms of chest pain, shortness of breath,  dizziness, presyncope, or syncope.  The patient is otherwise without complaint today.   Past Medical History:  Diagnosis Date  . Anemia   . Arthritis   . AV block, Mobitz 2 11/14/09   s/p MDT Revo PPM by JA  . Bipolar disorder ([place])   . Cancer ([place])    right breast/LAST CHEOM 10/07/11  . Chills   . Easy bruising   . Elevated LFTs    hx  . Fever   . Fibromyalgia   . Generalized headaches   . GERD (gastroesophageal reflux disease)   . Hypertension   . Morbid obesity with body mass index of 40.0-44.9 in adult ([place]) 10/22/2011   BMI 44.26    . Osteoporosis   . Pacemaker   . Sleep apnea    STOP BANG SCORE 4  . Weakness    Past Surgical History:  Procedure Laterality Date  . BILATERAL KNEE ARTHROSCOPY    . BREAST SURGERY  2012,12   lumpectomy,mastectomy bil  . CHOLECYSTECTOMY    . ECTOPIC PREGNANCY SURGERY     x2  . gastric bypass surgery    . left ovary and fallopian tube removed due to chronic pain    . mastectomy  2013   bilateral  . MULTIPLE EXTRACTIONS WITH ALVEOLOPLASTY N/A 06/04/2012   Procedure: MULTIPLE EXTRACION WITH ALVEOLOPLASTY EXTRACT: 4, 5, 8, 9, 10, 13, 14 ,28;  Surgeon: Gae Bon, DDS;  Location: [facility];  Service: Oral Surgery;  Laterality: N/A;  . PACEMAKER INSERTION  2011  . plastic surgery on face     for ptosis of rt. eyelid  . PORTACATH PLACEMENT     power port - right  . PPM  11/14/09   MDT Revo implanted by Dr Rayann Heman for syncope/ Mobitz II AV block    Current Outpatient Prescriptions  Medication Sig Dispense Refill  . aspirin 81 MG EC tablet Take 81 mg by mouth daily.     . Calcium Carbonate-Vitamin D 600-400 MG-UNIT per tablet Take 1 tablet by mouth daily.     .  clonazePAM (KLONOPIN) 1 MG tablet Take 1 mg by mouth 2 (two) times daily as needed for anxiety.     . clotrimazole-betamethasone (LOTRISONE) cream Apply 1 application topically 2 (two) times daily as needed (itching).     . cyclobenzaprine (FLEXERIL) 10 MG tablet Take 10 mg by mouth at bedtime. Muscle spasm    . diclofenac sodium (VOLTAREN) 1 % GEL Place 2 g onto the skin 3 (three) times daily as needed (arthritis).     . DULoxetine (CYMBALTA) 60 MG capsule Take 60 mg by mouth every evening.     . fentaNYL (DURAGESIC - DOSED MCG/HR) 100 MCG/HR Place 300 mcg onto the skin every 3 (three) days.     . hydroxypropyl methylcellulose (ISOPTO TEARS) 2.5 % ophthalmic solution Place 1 drop into both eyes 4 (four) times daily as needed (dry eyes).    Marland Kitchen lidocaine-prilocaine (EMLA) cream Apply 1 application topically as needed (prior to chemo). For port (2.5%/2/5%)    . Multiple Vitamin (THERA) TABS Take 1 tablet by mouth at bedtime.     Marland Kitchen omeprazole (PRILOSEC) 20 MG capsule Take 20 mg by mouth at bedtime.     Marland Kitchen  Oxycodone HCl 20 MG TABS Take 40 mg by mouth every 3 (three) hours.     . potassium chloride SA (K-DUR,KLOR-CON) 20 MEQ tablet Take 20 mEq by mouth daily.     . pregabalin (LYRICA) 100 MG capsule Take 100 mg by mouth 2 (two) times daily.    . promethazine (PHENERGAN) 25 MG tablet Take 25 mg by mouth every 8 (eight) hours as needed for nausea.     Marland Kitchen spironolactone (ALDACTONE) 25 MG tablet Take 25 mg by mouth 2 (two) times daily.    . tamoxifen (NOLVADEX) 10 MG tablet Take by mouth.    . triamcinolone (KENALOG) 0.1 % paste Place 1 application onto teeth 2 (two) times daily as needed (affected area on gums or mouth.). Apply to gums or mouth.    . zolpidem (AMBIEN) 10 MG tablet Take 10-15 mg by mouth at bedtime.      No current facility-administered medications for this visit.     Physical Exam: Vitals:   11/09/15 0846  BP: 96/64  Pulse: 79  SpO2: 99%  Weight: 167 lb (75.8 kg)  Height: '5\' 5"'$   (1.651 m)    GEN- The patient is chronically ill appearing, alert and oriented x 3 today.   Head- normocephalic, atraumatic Eyes-  Sclera clear, conjunctiva pink Ears- hearing intact Oropharynx- clear Lungs- Clear to ausculation bilaterally, normal work of breathing Chest- pacemaker pocket is well healed  Heart- Regular rate and rhythm, no murmurs, rubs or gallops, PMI not laterally displaced GI- soft, NT, ND, + BS Extremities- no clubbing, cyanosis, or edema MS- walks slowly with a cane  Pacemaker interrogation- reviewed in detail today,  See PACEART report  Assessment and Plan:  1. Second degree AV block   Normal pacemaker function See Claudia Desanctis Art report R waves are chronically low.  Given her current state and stability of her device, I have opted to not consider lead revision. She has an MRI approved device and has had prior MRIs.  I have  Reassured her that she can have MRIs if her oncologist feels that this is necessary.  2. Breast cancer Appears to be progressing.  3. Obesity Stable No change required today  carelink compliance encouraged.  She states that she has difficulty remembering. Return to the device clinic in 12 months  Thompson Grayer MD, [facility] 11/09/2015 10:06 AM

## 2015-11-21 ENCOUNTER — Telehealth: Payer: Self-pay | Admitting: *Deleted

## 2015-11-21 NOTE — Telephone Encounter (Signed)
Star with Dr. Tasia Catchings made aware.  Note faxed.

## 2015-11-21 NOTE — Telephone Encounter (Signed)
-----   Message from Dionicio Stall, RN sent at 11/21/2015  3:06 PM EDT ----- Regarding: RE: clearance for chemo Discussed with Dr Rayann Heman, okay to proceed with Chemo if medically necessary.   Claiborne Billings ----- Message ----- From: Laurine Blazer, LPN Sent: 13/24/4010   2:18 PM To: Thompson Grayer, MD, Dionicio Stall, RN Subject: clearance for chemo                            Star from Dr. Collie Siad office (oncologist) calling about clearance for patient to have chemo today.  Office note has been faxed to them, but does not say specifically she is clear.  Per Star, the patient told them that Dr. Rayann Heman said she could have today, but do not see this documented.    Looks like Dr. Rayann Heman is in the hospital this afternoon.  Can you possibly get message to him to see if we could get answer for this patient?  Gladstone Lighter

## 2015-11-30 LAB — CUP PACEART INCLINIC DEVICE CHECK
Battery Voltage: 2.97 V
Brady Statistic AP VS Percent: 0.54 %
Brady Statistic AS VP Percent: 2.48 %
Date Time Interrogation Session: 20170929130743
Implantable Lead Implant Date: 20111005
Implantable Lead Location: 753860
Implantable Lead Model: 5086
Lead Channel Impedance Value: 440 Ohm
Lead Channel Pacing Threshold Amplitude: 1 V
Lead Channel Pacing Threshold Pulse Width: 0.4 ms
Lead Channel Pacing Threshold Pulse Width: 0.4 ms
Lead Channel Setting Pacing Amplitude: 2 V
Lead Channel Setting Pacing Amplitude: 2.5 V
Lead Channel Setting Pacing Pulse Width: 0.4 ms
Lead Channel Setting Sensing Sensitivity: 0.6 mV
MDC IDC LEAD IMPLANT DT: 20111005
MDC IDC LEAD LOCATION: 753859
MDC IDC LEAD MODEL: 5086
MDC IDC MSMT LEADCHNL RA IMPEDANCE VALUE: 480 Ohm
MDC IDC MSMT LEADCHNL RA PACING THRESHOLD AMPLITUDE: 0.5 V
MDC IDC MSMT LEADCHNL RA SENSING INTR AMPL: 2.262 mV
MDC IDC MSMT LEADCHNL RV SENSING INTR AMPL: 1.413 mV
MDC IDC STAT BRADY AP VP PERCENT: 0.11 %
MDC IDC STAT BRADY AS VS PERCENT: 96.87 %
MDC IDC STAT BRADY RA PERCENT PACED: 0.65 %
MDC IDC STAT BRADY RV PERCENT PACED: 2.6 %

## 2015-12-13 ENCOUNTER — Other Ambulatory Visit (HOSPITAL_COMMUNITY): Payer: Medicare Other

## 2015-12-13 ENCOUNTER — Encounter (HOSPITAL_COMMUNITY): Payer: Self-pay | Admitting: Oncology

## 2015-12-13 ENCOUNTER — Encounter (HOSPITAL_COMMUNITY): Payer: Medicare Other | Attending: Oncology | Admitting: Oncology

## 2015-12-13 VITALS — BP 130/74 | HR 80 | Temp 99.1°F | Resp 16 | Ht 64.5 in | Wt 166.0 lb

## 2015-12-13 DIAGNOSIS — Z170 Estrogen receptor positive status [ER+]: Secondary | ICD-10-CM

## 2015-12-13 DIAGNOSIS — C7951 Secondary malignant neoplasm of bone: Secondary | ICD-10-CM | POA: Diagnosis not present

## 2015-12-13 DIAGNOSIS — F418 Other specified anxiety disorders: Secondary | ICD-10-CM | POA: Diagnosis not present

## 2015-12-13 DIAGNOSIS — S80861A Insect bite (nonvenomous), right lower leg, initial encounter: Secondary | ICD-10-CM | POA: Diagnosis not present

## 2015-12-13 DIAGNOSIS — G8929 Other chronic pain: Secondary | ICD-10-CM | POA: Diagnosis not present

## 2015-12-13 DIAGNOSIS — C50919 Malignant neoplasm of unspecified site of unspecified female breast: Secondary | ICD-10-CM

## 2015-12-13 DIAGNOSIS — C50911 Malignant neoplasm of unspecified site of right female breast: Secondary | ICD-10-CM | POA: Diagnosis present

## 2015-12-13 DIAGNOSIS — G894 Chronic pain syndrome: Secondary | ICD-10-CM

## 2015-12-13 HISTORY — DX: Other chronic pain: G89.29

## 2015-12-13 NOTE — Progress Notes (Signed)
[facility] Hematology/Oncology Consultation   Name: NUR RABOLD      MRN: [id number]    Date: 12/13/2015 Time:5:55 PM   REFERRING PHYSICIAN:  Leroy Kennedy, MD (Primary care provider)  REASON FOR CONSULT:  "to follow her metastatic breast cancer."   DIAGNOSIS:  ER/PR/HER2 POSITIVE metastatic breast cancer    Breast cancer metastasized to bone ([place])   01/01/2011 Procedure    Partial mastectomy and sentinel node biopsy      01/01/2011 Pathology Results    Poorly differentiated invasive ductal carcinoma, 2.0 cm with +LVI, measuring 2.0 cm.  0.1 cm of closest margin (deep) with positive margin of the superior and medial aspects with DCIS.  ER 80%, PR 75%, Ki-67 43%, HER2 3+ POSITIVE.      10/20/2011 Procedure    Right mastectomy and prophylactic left simple mastectomy by Dr. Margot Chimes.      10/20/2011 Pathology Results    Right invasive ductal carcinoma measuring 19.2 cm, grade II with angiolymphatic invasion and perineural invasion with negative resection margins.  Right axillary dissection shows 2/4 lymph nodes positive for disease with extracapsular extension.  ER 70%, PR 0%, HER2+ by [facility].      03/29/2012 PET scan    1.  Postoperative uptake in the anterior right chest wall without evidence of metastatic disease. 2.  Mildly hypermetabolic left level II lymph nodes may be reactive.      05/28/2012 Imaging    MR L-spine-  New abnormal L5 and S1/S2 bone lesions most compatible with metastatic disease to bone in this setting.      06/11/2012 Procedure    L5 and S1 bone biopsy by Dr. Estanislado Pandy with kyphoplasty      06/11/2012 Pathology Results    L5 bone biopsy is positive for metastatic carcinoma, ER+, HER2 POSITIVE.  S1 biopsy is negative for malignancy      08/16/2012 Initial Diagnosis    Breast cancer metastasized to bone ([place])     09/15/2012 Imaging    MRI L-Spine- T10 vertebral body lesion measuring approximately 2 x 2.4 cm with hypermetabolic activity  on recent PET-CT, most consistent with metastatic disease.      11/12/2012 PET scan    Sclerotic osseous metastases at T10-11, L5, and left sacrum/pelvis, grossly unchanged, max SUV 5.4.  Interval vertebral augmentation at L5 with left sacroplasty.  No evidence of new/progressive metastatic disease in the chest      07/20/2013 PET scan    1. Similar to slight progression of osseous metastasis. 2. Lingular patchy pulmonary opacity and hypermetabolism. Favored to represent an area of mild infection. 3. Right upper lobe subpleural ground-glass opacity and hypermetabolism. If the patient has had radiation in this area, evolving radiation change could have this appearance. A second focus of infection is felt slightly less likely. Not typical of metastatic disease. 4. Hypermetabolic right thyroid nodule. Given comorbidities, of questionable clinical significance. Recommend attention on follow-up.      09/23/2013 Procedure    T10 bone biopsy by Dr. Estanislado Pandy      09/23/2013 Pathology Results    T10 bone is positive for metastatic carcinoma ER MOSTLY NEGATIVE.      09/27/2013 Procedure    Status post radiofrequency ablation for tumor ablation at T10 followed by vertebral body augmentation by Dr. Estanislado Pandy      01/26/2014 Imaging    MRI T and L-spine- 1. Prior vertebral augmentation T10 and L5 and prior left sacroplasty. No evidence of new  thoracic or lumbar osseous metastases or epidural tumor. 2. Lesions in the sternum and left iliac wing as described on prior PET-CT. 3. Mild thoracic and lumbar spondylosis without spinal stenosis. Moderate to severe lower lumbar facet arthrosis.      03/28/2014 Imaging    MRI brain- Negative for intracranial metastasis or other explanation for headache.      07/31/2014 PET scan    Status post bilateral mastectomy with right axillary lymph node dissection.  10 mm short axis right superior mediastinal nodal metastasis, new.  Mild  progression of multifocal osseous metastases.      04/04/2015 PET scan    Status post bilateral mastectomy with right axillary lymph node dissection.  12 mm short axis right superior mediastinal nodal metastasis, mildly increased in size, but with decreased hypermetabolism.  Multifocal osseous metastases, overall with overall mildly decreased hypermetabolism.  No evidence of new/progressive metastatic disease.       HISTORY OF PRESENT ILLNESS:   Ashlee Moore is a 55 y.o. 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 Lucy Antigua at [facility], Lumbar DDD, fibromyalgia, osteoarthritis, chronic pain, urinary incontinence, chronic headaches, allergic rhinitis, S/P gastric bypass for obesity, enlarged uterus with multiple leiomyomas,  who is referred to the [facility] for transfer of medical oncology care for ER/PR/HER2 POSITIVE metastatic breast cancer S/P right total mastectomy and prophylactic left mastectomy (10/03/2011) with past treatments consisting of letrozole, arimidex, aromasin, and faslodex with poor tolerance.  Herceptin has been continued since time of diagnosis.  She does admit to past radiation therapy following her initial diagnosis.  She has been on anti-endocrine therapy since.  She is unsure about any systemic chemotherapy.  She notes that she has never lost her hair throughout treatment (which she attributes to using Johnson's baby shampoo).  She reports a "bite" on her right ankle.  She thinks it is a spider bite and she is concerned because her son has seen a brown recluse spider nearby.  She notes that she noted it yesterday.  She has put betadine on this.  She reports that it is tender to palpation.  She reports a fall this AM at 4:30.  She thinks that she fell asleep while on the toilet resulting in a fall.  Fortunately, no visible trauma is noted.  She is  not clear on her oncology history and nonspecific about past treatments.  She does report her current regimen accurately.  She reports ongoing headaches, double vision, and blurred vision.  She notes that this has been ongoing for months, possibly up to 1 year.  She reports palpable nodules throughout her right upper extremity that are not appreciated on exam today. She is nervous about her cancer.    Review of Systems  Constitutional: Negative.  Negative for chills, fever and weight loss.  Eyes: Positive for blurred vision and double vision.  Respiratory: Negative.  Negative for cough and hemoptysis.   Cardiovascular: Negative.  Negative for chest pain.  Gastrointestinal: Negative.  Negative for constipation, diarrhea, nausea and vomiting.  Genitourinary: Negative.   Musculoskeletal: Positive for falls.  Skin: Negative.   Neurological: Positive for headaches. Negative for weakness.  Endo/Heme/Allergies: Negative.   Psychiatric/Behavioral: Negative.   14 point review of systems was performed and is negative except as detailed under history of present illness and above    PAST MEDICAL HISTORY:   Past Medical History:  Diagnosis Date  . Anemia   .  Arthritis   . AV block, Mobitz 2 11/14/09   s/p MDT Revo PPM by JA  . Bipolar disorder ([place])   . Breast cancer metastasized to bone ([place]) 08/16/2012  . Cancer ([place])    right breast/LAST CHEOM 10/07/11  . Chills   . Chronic pain 12/13/2015  . Easy bruising   . Elevated LFTs    hx  . Fever   . Fibromyalgia   . Generalized headaches   . GERD (gastroesophageal reflux disease)   . Hypertension   . Morbid obesity with body mass index of 40.0-44.9 in adult ([place]) 10/22/2011   BMI 44.26    . Osteoporosis   . Pacemaker   . Sleep apnea    STOP BANG SCORE 4  . Weakness     ALLERGIES: Allergies  Allergen Reactions  . Penicillins Swelling  . Sulfonamide Derivatives Rash  . Tape Other (See Comments)    Adhesive tape- RASH, SWELLING       MEDICATIONS: I have reviewed the patient's current medications.    Current Outpatient Prescriptions on File Prior to Visit  Medication Sig Dispense Refill  . aspirin 81 MG EC tablet Take 81 mg by mouth daily.     . Calcium Carbonate-Vitamin D 600-400 MG-UNIT per tablet Take 1 tablet by mouth daily.     . clonazePAM (KLONOPIN) 1 MG tablet Take 1 mg by mouth 2 (two) times daily as needed for anxiety.     . clotrimazole-betamethasone (LOTRISONE) cream Apply 1 application topically 2 (two) times daily as needed (itching).     . cyclobenzaprine (FLEXERIL) 10 MG tablet Take 10 mg by mouth at bedtime. Muscle spasm    . diclofenac sodium (VOLTAREN) 1 % GEL Place 2 g onto the skin 3 (three) times daily as needed (arthritis).     . DULoxetine (CYMBALTA) 60 MG capsule Take 60 mg by mouth every evening.     . fentaNYL (DURAGESIC - DOSED MCG/HR) 100 MCG/HR Place 300 mcg onto the skin every 3 (three) days.     . hydroxypropyl methylcellulose (ISOPTO TEARS) 2.5 % ophthalmic solution Place 1 drop into both eyes 4 (four) times daily as needed (dry eyes).    Marland Kitchen lidocaine-prilocaine (EMLA) cream Apply 1 application topically as needed (prior to chemo). For port (2.5%/2/5%)    . Multiple Vitamin (THERA) TABS Take 1 tablet by mouth at bedtime.     Marland Kitchen omeprazole (PRILOSEC) 20 MG capsule Take 20 mg by mouth at bedtime.     . Oxycodone HCl 20 MG TABS Take 40 mg by mouth every 3 (three) hours.     . potassium chloride SA (K-DUR,KLOR-CON) 20 MEQ tablet Take 20 mEq by mouth daily.     . pregabalin (LYRICA) 100 MG capsule Take 100 mg by mouth 2 (two) times daily.    . promethazine (PHENERGAN) 25 MG tablet Take 25 mg by mouth every 8 (eight) hours as needed for nausea.     Marland Kitchen spironolactone (ALDACTONE) 25 MG tablet Take 25 mg by mouth 2 (two) times daily.    . tamoxifen (NOLVADEX) 10 MG tablet Take by mouth.    . triamcinolone (KENALOG) 0.1 % paste Place 1 application onto teeth 2 (two) times daily as needed (affected area  on gums or mouth.). Apply to gums or mouth.    . zolpidem (AMBIEN) 10 MG tablet Take 10-15 mg by mouth at bedtime.      No current facility-administered medications on file prior to visit.  PAST SURGICAL HISTORY Past Surgical History:  Procedure Laterality Date  . BILATERAL KNEE ARTHROSCOPY    . BREAST SURGERY  2012,12   lumpectomy,mastectomy bil  . CHOLECYSTECTOMY    . ECTOPIC PREGNANCY SURGERY     x2  . gastric bypass surgery    . left ovary and fallopian tube removed due to chronic pain    . mastectomy  2013   bilateral  . MULTIPLE EXTRACTIONS WITH ALVEOLOPLASTY N/A 06/04/2012   Procedure: MULTIPLE EXTRACION WITH ALVEOLOPLASTY EXTRACT: 4, 5, 8, 9, 10, 13, 14 ,28;  Surgeon: Gae Bon, DDS;  Location: [facility];  Service: Oral Surgery;  Laterality: N/A;  . PACEMAKER INSERTION  2011  . plastic surgery on face     for ptosis of rt. eyelid  . PORTACATH PLACEMENT     power port - right  . PPM  11/14/09   MDT Revo implanted by Dr Rayann Heman for syncope/ Mobitz II AV block    FAMILY HISTORY: Family History  Problem Relation Age of Onset  . Heart attack Father   . Breast cancer Sister   . Cancer Sister     brain  . Cancer Brother     lung  . Cancer Other     colon  . Brain cancer Sister     SOCIAL HISTORY:  reports that she quit smoking about 30 years ago. Her smoking use included Cigarettes. She has a 2.40 pack-year smoking history. She has never used smokeless tobacco. She reports that she does not drink alcohol or use drugs.  Her grandson lives with her.  Social History   Social History  . Marital status: Single    Spouse name: N/A  . Number of children: N/A  . Years of education: N/A   Social History Main Topics  . Smoking status: Former Smoker    Packs/day: 0.30    Years: 8.00    Types: Cigarettes    Quit date: 02/10/1985  . Smokeless tobacco: Never Used     Comment: Quit 25 years back.   . Alcohol use No  . Drug use: No  . Sexual activity: Not Asked    Other Topics Concern  . None   Social History Narrative   Lives with her children. Disabled.     PERFORMANCE STATUS: The patient's performance status is 2 - Symptomatic, <50% confined to bed  PHYSICAL EXAM: Most Recent Vital Signs: Blood pressure 130/74, pulse 80, temperature 99.1 F (37.3 C), temperature source Oral, resp. rate 16, height 5' 4.5" (1.638 m), weight 166 lb (75.3 kg), SpO2 99 %. General appearance: alert, appears stated age, no distress, moderate distress, moderately obese and fatigued appearing, unaccompanied Head: Normocephalic, without obvious abnormality, atraumatic Eyes: negative findings: lids and lashes normal, conjunctivae and sclerae normal and corneas clear Neck: no adenopathy, supple, symmetrical, trachea midline and thyroid not enlarged, symmetric, no tenderness/mass/nodules Lungs: clear to auscultation bilaterally and normal percussion bilaterally Breasts: positive findings: B/L mastectomy without any abnormal palpable nodules/lesions and without any skin abnormalities Heart: regular rate and rhythm, S1, S2 normal, no murmur, click, rub or gallop Abdomen: soft, non-tender; bowel sounds normal; no masses,  no organomegaly Extremities: extremities normal, atraumatic, no cyanosis or edema Skin: Skin color, texture, turgor normal. No rashes or lesions Lymph nodes: Cervical, supraclavicular, and axillary nodes normal. Neurologic: Grossly normal  LABORATORY DATA:  No results found for this or any previous visit (from the past 48 hour(s)).    RADIOGRAPHY: No results found.     PATHOLOGY:  ASSESSMENT/PLAN:   Breast cancer metastasized to bone (H[facility]) HER 2 positive ER+ Anxiety about health  ER/PR positive, HER2 POSITIVE metastatic breast cancer to bone, initially diagnosed with a Stage IIA right invasive breast cancer in 2012 treatment with lumpectomy and XRT with recurrent disease (Stage IIIB) on the right in 2013.  Stage IV disease was biopsy proven  on bone biopsy of L-spine vertebra.  Past treatments have been discontinued due to intolerance: Arimidex, Femara, Aromasin, and Faslodex.  Currently on Tamoxifen/Herceptin/Xgeva.  Oncology history developed based upon available information.  Staging in [facility] problem list is completed.  Labs today: CBC diff, CMET, CA 27.29, and CA 15-3.  Will request that her bone biopsies be tested for HER2.  I will call pathology and request this information.  Order is placed for 2D ECHO.  She thinks her last treatment in [place], [place] was on 11/21/2015.  She reports that she received Herceptin and Xgeva that day.  Treatment plan built for Herceptin every 3 weeks.  Supportive therapy plan is built for [employer].  She is due for restaging imaging, therefore PET scan is ordered.  She has an insect bite on her right medial aspect of her lower leg.  Recommend supportive care only and report to PCP or ED if worsened.  She received her influenza immunization last month elsewhere.  Return in ~ 2 weeks for follow-up with Herceptin treatment and review of data.  Chronic pain Chronic pain syndrome with documentation of osseous involvement of cancer, lumbar degenerative disc disease, fibromyalgia, and generalized osteoarthritis,.  On an exorbitant amount of pain medication: Fentanyl 300 mcg and ~ 450 tablets of 20 mg of Oxycodone per month.  [place] Controlled Substance Reporting System is reviewed in detail.  She is on Fentanyl and Oxycodone:   Upon my review, in addition to her 300 mcg of Fentanyl, she receives #450 Oxycodone 20 mg per month.  This equates to approximately 800 mg of PO morphine per day.  We will defer pain management to her primary care provider who has been prescribing her pain medications prior to [facility] closing, in addition to it is best for the patient to receive her pain medications from one healthcare provider.  She may benefit from a referral to pain management for further options,  including an intrathecal pain pump.  She reports an episode of falling this AM at 4:30.  She thinks she fell asleep while on the toilet.   ORDERS PLACED FOR THIS ENCOUNTER: Orders Placed This Encounter  Procedures  . NM PET Image Restag (PS) Skull Base To Thigh  . MR Brain W Wo Contrast  . CBC with Differential  . Comprehensive metabolic panel  . Cancer antigen 27.29  . Cancer antigen 15-3  . Ambulatory referral to Social Work  . ECHOCARDIOGRAM COMPLETE    MEDICATIONS PRESCRIBED THIS ENCOUNTER: Meds ordered this encounter  Medications  . furosemide (LASIX) 40 MG tablet    Sig: Take 40 mg by mouth.  . triamterene-hydrochlorothiazide (MAXZIDE-25) 37.5-25 MG tablet    Sig: Take 1 tablet by mouth daily.    All questions were answered. The patient knows to call the clinic with any problems, questions or concerns. We can certainly see the patient much sooner if necessary. This note is electronically signed by Molli Hazard, MD :12/13/2015 5:55 PM

## 2015-12-13 NOTE — Assessment & Plan Note (Addendum)
Chronic pain syndrome with documentation of osseous involvement of cancer, lumbar degenerative disc disease, fibromyalgia, and generalized osteoarthritis,.  On an exorbitant amount of pain medication: Fentanyl 300 mcg and ~ 450 tablets of 20 mg of Oxycodone per month.  [place] Controlled Substance Reporting System is reviewed in detail.  She is on Fentanyl and Oxycodone:   Upon my review, in addition to her 300 mcg of Fentanyl, she receives #450 Oxycodone 20 mg per month.  This equates to approximately 800 mg of PO morphine per day.  We will defer pain management to her primary care provider who has been prescribing her pain medications prior to [facility] closing, in addition to it is best for the patient to receive her pain medications from one healthcare provider.  We are concerned about her pain medication use and concern for overdose given the amount of medications prescribed.  She may benefit from a referral to pain management for further options, including an intrathecal pain pump.  She reports an episode of falling this AM at 4:30.  She thinks she fell asleep while on the toilet.

## 2015-12-13 NOTE — Assessment & Plan Note (Addendum)
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  Stage IV disease was biopsy proven on bone biopsy of L-spine vertebra.  Past treatments have been discontinued due to intolerance: Arimidex, Femara, Aromasin, and Faslodex.  Currently on Tamoxifen/Herceptin/Xgeva.  Oncology history developed based upon available information.  Staging in [facility] problem list is completed.  Labs today: CBC diff, CMET, CA 27.29, and CA 15-3.  Will request that her bone biopsies be tested for HER2.  I will call pathology and request this information.  Order is placed for 2D ECHO.  She thinks her last treatment in [place], [place] was on 11/21/2015.  She reports that she received Herceptin and Xgeva that day.  Treatment plan built for Herceptin every 3 weeks.  Supportive therapy plan is built for [employer].  She is due for restaging imaging, therefore PET and MRI brain w and wo contrast are ordered.  She has an insect bite on her right medial aspect of her lower leg.  Recommend supportive care only and report to PCP or ED if worsened.  She received her influenza immunization last month elsewhere.  Return in ~ 2 weeks for follow-up with Herceptin treatment and review of data.

## 2015-12-13 NOTE — Patient Instructions (Signed)
[place] at [facility] Discharge Instructions  RECOMMENDATIONS MADE BY THE CONSULTANT AND ANY TEST RESULTS WILL BE SENT TO YOUR REFERRING PHYSICIAN.  You were seen today by Kirby Crigler PA-C and Dr. Whitney Muse. Labs today will call with results. PET scan, MRI, 2D Echo and treatment to start around 2 weeks.   Thank you for choosing [place] at [facility] to provide your oncology and hematology care.  To afford each patient quality time with our provider, please arrive at least 15 minutes before your scheduled appointment time.   Beginning January 23rd 2017 lab work for the [facility] will be done in the  Main lab at [facility] on 1st floor. If you have a lab appointment with the [facility] please come in thru the  Main Entrance and check in at the main information desk  You need to re-schedule your appointment should you arrive 10 or more minutes late.  We strive to give you quality time with our providers, and arriving late affects you and other patients whose appointments are after yours.  Also, if you no show three or more times for appointments you may be dismissed from the clinic at the providers discretion.     Again, thank you for choosing [facility].  Our hope is that these requests will decrease the amount of time that you wait before being seen by our physicians.       _____________________________________________________________  Should you have questions after your visit to [facility], please contact our office at [phone] between the hours of 8:30 a.m. and 4:30 p.m.  Voicemails left after 4:30 p.m. will not be returned until the following business day.  For prescription refill requests, have your pharmacy contact our office.         Resources For Cancer Patients and their Caregivers ? American Cancer Society: Can assist with transportation, wigs, general needs, runs Look Good Feel Better.         4123722690 ? Cancer Care: Provides financial assistance, online support groups, medication/co-pay assistance.  1-800-813-HOPE 678-110-8148) ? [place] Assists [place] cancer patients and their families through emotional , educational and financial support.  702-759-2760 ? [place] DSS Where to apply for food stamps, Medicaid and utility assistance. (302) 361-5517 ? [facility]: Transportation to medical appointments. 804-448-2215 ? Social Security Administration: May apply for disability if have a Stage IV cancer. 6087350433 919-864-7973 ? [employer], Disability and Transit Services: Assists with nutrition, care and transit needs. [place] Support Programs: '@10RELATIVEDAYS'$ @ > Cancer Support Group  2nd Tuesday of the month 1pm-2pm, Journey Room  > Creative Journey  3rd Tuesday of the month 1130am-1pm, Journey Room  > Look Good Feel Better  1st Wednesday of the month 10am-12 noon, Journey Room (Call [place] to register [phone])

## 2015-12-14 ENCOUNTER — Other Ambulatory Visit (HOSPITAL_COMMUNITY): Payer: Medicare Other

## 2015-12-18 ENCOUNTER — Other Ambulatory Visit (HOSPITAL_COMMUNITY): Payer: Medicare Other

## 2015-12-19 ENCOUNTER — Ambulatory Visit (HOSPITAL_COMMUNITY): Admission: RE | Admit: 2015-12-19 | Payer: Medicare Other | Source: Ambulatory Visit

## 2015-12-24 ENCOUNTER — Ambulatory Visit (HOSPITAL_COMMUNITY): Payer: Medicare Other

## 2015-12-24 ENCOUNTER — Ambulatory Visit (HOSPITAL_COMMUNITY): Admission: RE | Admit: 2015-12-24 | Payer: Medicare Other | Source: Ambulatory Visit

## 2015-12-25 ENCOUNTER — Ambulatory Visit (HOSPITAL_COMMUNITY): Payer: Medicare Other

## 2015-12-27 ENCOUNTER — Ambulatory Visit (HOSPITAL_COMMUNITY): Payer: Medicare Other | Admitting: Hematology & Oncology

## 2015-12-27 ENCOUNTER — Ambulatory Visit (HOSPITAL_COMMUNITY): Payer: Medicare Other

## 2016-01-01 ENCOUNTER — Encounter: Payer: Self-pay | Admitting: *Deleted

## 2016-01-01 NOTE — Progress Notes (Signed)
[place] Psychosocial Distress Screening Clinical Social Work  Clinical Social Work was referred by distress screening protocol.  The patient scored a 6 on the Psychosocial Distress Thermometer which indicates moderate distress. Clinical Social Worker reviewed chart and phoned pt to assess for distress and other psychosocial needs. CSW phoned pt at home and left supportive message explaining role of CSW and encouraged pt to return call.   ONCBCN DISTRESS SCREENING 12/13/2015  Screening Type Initial Screening  Distress experienced in past week (1-10) 6  Practical problem type Food;Transportation  Family Problem type Other (comment)  Information Concerns Type Lack of info about diagnosis;Lack of info about treatment  Physical Problem type Pain;Nausea/vomiting;Sleep/insomnia;Getting around;Bathing/dressing;Loss of appetitie;Tingling hands/feet;Skin dry/itchy;Swollen arms/legs    Clinical Social Worker follow up needed: Yes.    If yes, follow up plan: CSW awaits return call and will assist accordingly.   Loren Racer, [place] Tuesdays   [phone]

## 2016-01-17 ENCOUNTER — Other Ambulatory Visit (HOSPITAL_COMMUNITY): Payer: Self-pay | Admitting: Hematology & Oncology

## 2016-01-17 ENCOUNTER — Ambulatory Visit (HOSPITAL_COMMUNITY): Payer: Medicare Other

## 2016-01-17 ENCOUNTER — Other Ambulatory Visit (HOSPITAL_COMMUNITY): Payer: Medicare Other

## 2016-01-24 ENCOUNTER — Ambulatory Visit (HOSPITAL_COMMUNITY): Payer: Medicare Other

## 2016-01-24 ENCOUNTER — Other Ambulatory Visit (HOSPITAL_COMMUNITY): Payer: Medicare Other

## 2016-02-09 ENCOUNTER — Encounter (HOSPITAL_COMMUNITY): Payer: Self-pay | Admitting: Oncology

## 2016-02-12 ENCOUNTER — Telehealth: Payer: Self-pay | Admitting: Cardiology

## 2016-02-12 ENCOUNTER — Encounter: Payer: Medicare Other | Admitting: *Deleted

## 2016-02-12 NOTE — Telephone Encounter (Signed)
Spoke with pt and reminded pt of remote transmission that is due today. Pt verbalized understanding.   

## 2016-02-15 ENCOUNTER — Encounter: Payer: Self-pay | Admitting: Cardiology

## 2016-04-01 ENCOUNTER — Other Ambulatory Visit (HOSPITAL_COMMUNITY): Payer: Self-pay | Admitting: Internal Medicine

## 2016-04-01 DIAGNOSIS — C50811 Malignant neoplasm of overlapping sites of right female breast: Secondary | ICD-10-CM

## 2016-04-03 ENCOUNTER — Encounter: Payer: Self-pay | Admitting: Cardiology

## 2016-04-09 ENCOUNTER — Encounter (HOSPITAL_COMMUNITY)
Admission: RE | Admit: 2016-04-09 | Discharge: 2016-04-09 | Disposition: A | Discharge status: Home / Self Care | Payer: Medicare Other | Source: Ambulatory Visit | Attending: Internal Medicine | Admitting: Internal Medicine

## 2016-04-09 DIAGNOSIS — C50811 Malignant neoplasm of overlapping sites of right female breast: Secondary | ICD-10-CM

## 2016-04-09 LAB — GLUCOSE, CAPILLARY
Glucose-Capillary: 43 mg/dL — CL (ref 65–99)
Glucose-Capillary: 99 mg/dL (ref 65–99)

## 2016-04-09 MED ORDER — FLUDEOXYGLUCOSE F - 18 (FDG) INJECTION
8.7100 | Freq: Once | INTRAVENOUS | Status: AC | PRN
  Administered 2016-04-09: 8.71 via INTRAVENOUS

## 2016-04-11 ENCOUNTER — Encounter: Payer: Medicare Other | Admitting: Internal Medicine

## 2016-07-24 ENCOUNTER — Telehealth (HOSPITAL_COMMUNITY): Payer: Self-pay

## 2016-07-24 NOTE — Telephone Encounter (Signed)
Patient called stating she wanted to make an appointment. I asked patient that I thought she had transferred her care. She states she did start going to see Dr. Jacquiline Doe but he is 4 hours away from her and her transportation cannot get her there anymore. She states Dr. Jacquiline Doe said to follow up at [facility]. He is supposed to be sending records. Explained to patient that I would have to review with the provider before an appointment could be made but hopefully someone would call her early next week. She verbalized understanding.

## 2016-07-24 NOTE — Telephone Encounter (Signed)
Amy will you make this patient an appointment. Or let me know when it is and I'll call her so I can tell her we will not prescribe her pain medication.

## 2016-07-24 NOTE — Telephone Encounter (Signed)
Yes.  We can see her but we will not be provider her pain medication.  She will need to get this from her PCP or pain management clinic.  TK

## 2016-07-25 ENCOUNTER — Ambulatory Visit (HOSPITAL_COMMUNITY): Payer: Medicare Other | Admitting: Adult Health

## 2016-08-11 ENCOUNTER — Encounter (HOSPITAL_COMMUNITY): Payer: Medicare Other | Attending: Oncology | Admitting: Oncology

## 2016-08-11 ENCOUNTER — Encounter (HOSPITAL_COMMUNITY): Payer: Self-pay | Admitting: Oncology

## 2016-08-11 VITALS — BP 139/73 | HR 77 | Temp 98.0°F | Resp 16

## 2016-08-11 DIAGNOSIS — Z79811 Long term (current) use of aromatase inhibitors: Secondary | ICD-10-CM | POA: Diagnosis not present

## 2016-08-11 DIAGNOSIS — C50919 Malignant neoplasm of unspecified site of unspecified female breast: Secondary | ICD-10-CM | POA: Diagnosis present

## 2016-08-11 DIAGNOSIS — C7951 Secondary malignant neoplasm of bone: Secondary | ICD-10-CM | POA: Diagnosis not present

## 2016-08-11 DIAGNOSIS — Z170 Estrogen receptor positive status [ER+]: Secondary | ICD-10-CM

## 2016-08-11 NOTE — Progress Notes (Signed)
[facility] Hematology/Oncology Consultation   Name: Ashlee Moore      MRN: [id number]    Date: 08/11/2016 Time:12:30 PM   REFERRING PHYSICIAN:  Leroy Kennedy, MD (Primary care provider)  REASON FOR CONSULT:  "to follow her metastatic breast cancer."   DIAGNOSIS:  ER/PR/HER2 POSITIVE metastatic breast cancer    Breast cancer metastasized to bone ([place])   01/01/2011 Procedure    Partial mastectomy and sentinel node biopsy      01/01/2011 Pathology Results    Poorly differentiated invasive ductal carcinoma, 2.0 cm with +LVI, measuring 2.0 cm.  0.1 cm of closest margin (deep) with positive margin of the superior and medial aspects with DCIS.  ER 80%, PR 75%, Ki-67 43%, HER2 3+ POSITIVE.      10/20/2011 Procedure    Right mastectomy and prophylactic left simple mastectomy by Dr. Margot Chimes.      10/20/2011 Pathology Results    Right invasive ductal carcinoma measuring 19.2 cm, grade II with angiolymphatic invasion and perineural invasion with negative resection margins.  Right axillary dissection shows 2/4 lymph nodes positive for disease with extracapsular extension.  ER 70%, PR 0%, HER2+ by [facility].      03/29/2012 PET scan    1.  Postoperative uptake in the anterior right chest wall without evidence of metastatic disease. 2.  Mildly hypermetabolic left level II lymph nodes may be reactive.      05/28/2012 Imaging    MR L-spine-  New abnormal L5 and S1/S2 bone lesions most compatible with metastatic disease to bone in this setting.      06/11/2012 Procedure    L5 and S1 bone biopsy by Dr. Estanislado Pandy with kyphoplasty      06/11/2012 Pathology Results    L5 bone biopsy is positive for metastatic carcinoma, ER+, HER2 POSITIVE.  S1 biopsy is negative for malignancy      08/16/2012 Initial Diagnosis    Breast cancer metastasized to bone ([place])     09/15/2012 Imaging    MRI L-Spine- T10 vertebral body lesion measuring approximately 2 x 2.4 cm with hypermetabolic activity  on recent PET-CT, most consistent with metastatic disease.      11/12/2012 PET scan    Sclerotic osseous metastases at T10-11, L5, and left sacrum/pelvis, grossly unchanged, max SUV 5.4.  Interval vertebral augmentation at L5 with left sacroplasty.  No evidence of new/progressive metastatic disease in the chest      07/20/2013 PET scan    1. Similar to slight progression of osseous metastasis. 2. Lingular patchy pulmonary opacity and hypermetabolism. Favored to represent an area of mild infection. 3. Right upper lobe subpleural ground-glass opacity and hypermetabolism. If the patient has had radiation in this area, evolving radiation change could have this appearance. A second focus of infection is felt slightly less likely. Not typical of metastatic disease. 4. Hypermetabolic right thyroid nodule. Given comorbidities, of questionable clinical significance. Recommend attention on follow-up.      09/23/2013 Procedure    T10 bone biopsy by Dr. Estanislado Pandy      09/23/2013 Pathology Results    T10 bone is positive for metastatic carcinoma ER MOSTLY NEGATIVE.      09/27/2013 Procedure    Status post radiofrequency ablation for tumor ablation at T10 followed by vertebral body augmentation by Dr. Estanislado Pandy      01/26/2014 Imaging    MRI T and L-spine- 1. Prior vertebral augmentation T10 and L5 and prior left sacroplasty. No evidence of new  thoracic or lumbar osseous metastases or epidural tumor. 2. Lesions in the sternum and left iliac wing as described on prior PET-CT. 3. Mild thoracic and lumbar spondylosis without spinal stenosis. Moderate to severe lower lumbar facet arthrosis.      03/28/2014 Imaging    MRI brain- Negative for intracranial metastasis or other explanation for headache.      07/31/2014 PET scan    Status post bilateral mastectomy with right axillary lymph node dissection.  10 mm short axis right superior mediastinal nodal metastasis, new.  Mild  progression of multifocal osseous metastases.      04/04/2015 PET scan    Status post bilateral mastectomy with right axillary lymph node dissection.  12 mm short axis right superior mediastinal nodal metastasis, mildly increased in size, but with decreased hypermetabolism.  Multifocal osseous metastases, overall with overall mildly decreased hypermetabolism.  No evidence of new/progressive metastatic disease.      04/09/2016 PET scan    1. Bony metastatic lesions are reduced in hypermetabolic activity compared to the prior exam, but not resolved. 2. The hypermetabolic upper mediastinal prevascular node is essentially stable in size and mildly increased in activity compared to the prior exam, SUV increase from 6.4 previously to 7.2 today. 3. No new metastatic lesions are identified. 4. Other imaging findings of potential clinical significance: Biliary dilatation, chronic. Postoperative findings in the stomach. Right lumbar hernia containing adipose tissue. Fullness of the right collecting system without a stone identified.       HISTORY OF PRESENT ILLNESS:   Ashlee Moore is a 56 y.o. 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 Lucy Antigua at [facility], Lumbar DDD, fibromyalgia, osteoarthritis, chronic pain, urinary incontinence, chronic headaches, allergic rhinitis, S/P gastric bypass for obesity, enlarged uterus with multiple leiomyomas,  who is referred to the [facility] for transfer of medical oncology care for ER/PR/HER2 POSITIVE metastatic breast cancer S/P right total mastectomy and prophylactic left mastectomy (10/03/2011) with past treatments consisting of letrozole, arimidex, aromasin, and faslodex with poor tolerance.  Herceptin has been continued since time of diagnosis.  She does admit to past radiation therapy following her initial diagnosis.   INTERVAL  HISTORY: Patient presents for follow-up today. She has no loss of follow-up since 12/2015. Patient states that she continues to follow with her oncologist Dr.Darovsky who is currently in [facility]. She states she is currently on Herceptin monthly and Xgeva. The last time patient received her treatment was roughly about 6 weeks ago. She states that she missed her last treatment due to transportation issues since [place] 4 hours away from her. She states that her oncologist told her to come here to get her treatment instead until she can follow up with him again. We have not had any communication with him regarding her getting temporary treatment here. Her last restaging imaging that we have his PET scan from 04/09/16 which showed reduction of her bony metastatic lesions in hypermetabolic activity, but not completely resolved. The hypermetabolic upper mediastinal prevascular node is stable in size mildly increase in activity with SUV increased from 6.4-7.2. No new metastatic lesions found.   Review of Systems  Constitutional: Negative.  Negative for chills, fever and weight loss.  HENT: Negative.   Eyes: Negative.  Negative for blurred vision and double vision.  Respiratory: Negative.  Negative for cough and hemoptysis.   Cardiovascular: Negative.  Negative for chest pain.  Gastrointestinal: Negative.  Negative for constipation, diarrhea, nausea and vomiting.  Genitourinary: Negative.   Musculoskeletal: Negative for falls.  Skin: Negative.   Neurological: Negative for weakness and headaches.  Endo/Heme/Allergies: Negative.   Psychiatric/Behavioral: Negative.   14 point review of systems was performed and is negative except as detailed under history of present illness and above    PAST MEDICAL HISTORY:   Past Medical History:  Diagnosis Date  . Anemia   . Arthritis   . AV block, Mobitz 2 11/14/09   s/p MDT Revo PPM by JA  . Bipolar disorder ([place])   . Breast cancer metastasized to bone ([place])  08/16/2012  . Cancer ([place])    right breast/LAST CHEOM 10/07/11  . Chills   . Chronic pain 12/13/2015  . Easy bruising   . Elevated LFTs    hx  . Fever   . Fibromyalgia   . Generalized headaches   . GERD (gastroesophageal reflux disease)   . Hypertension   . Morbid obesity with body mass index of 40.0-44.9 in adult ([place]) 10/22/2011   BMI 44.26    . Osteoporosis   . Pacemaker   . Sleep apnea    STOP BANG SCORE 4  . Weakness     ALLERGIES: Allergies  Allergen Reactions  . Penicillins Swelling  . Sulfonamide Derivatives Rash  . Tape Other (See Comments)    Adhesive tape- RASH, SWELLING   All adhesives      MEDICATIONS: I have reviewed the patient's current medications.    Current Outpatient Prescriptions on File Prior to Visit  Medication Sig Dispense Refill  . aspirin 81 MG EC tablet Take 81 mg by mouth daily.     . Calcium Carbonate-Vitamin D 600-400 MG-UNIT per tablet Take 1 tablet by mouth daily.     . clonazePAM (KLONOPIN) 1 MG tablet Take 1 mg by mouth 2 (two) times daily as needed for anxiety.     . clotrimazole-betamethasone (LOTRISONE) cream Apply 1 application topically 2 (two) times daily as needed (itching).     Marland Kitchen diclofenac sodium (VOLTAREN) 1 % GEL Place 2 g onto the skin 3 (three) times daily as needed (arthritis).     . DULoxetine (CYMBALTA) 60 MG capsule Take 60 mg by mouth every evening.     . fentaNYL (DURAGESIC - DOSED MCG/HR) 100 MCG/HR Place 400 mcg onto the skin every 3 (three) days.     . furosemide (LASIX) 40 MG tablet Take 40 mg by mouth.    . hydroxypropyl methylcellulose (ISOPTO TEARS) 2.5 % ophthalmic solution Place 1 drop into both eyes 4 (four) times daily as needed (dry eyes).    Marland Kitchen lidocaine-prilocaine (EMLA) cream Apply 1 application topically as needed (prior to chemo). For port (2.5%/2/5%)    . Multiple Vitamin (THERA) TABS Take 1 tablet by mouth at bedtime.     Marland Kitchen omeprazole (PRILOSEC) 20 MG capsule Take 20 mg by mouth at bedtime.     .  Oxycodone HCl 20 MG TABS Take 40 mg by mouth every 3 (three) hours.     . potassium chloride SA (K-DUR,KLOR-CON) 20 MEQ tablet Take 20 mEq by mouth daily.     . pregabalin (LYRICA) 100 MG capsule Take 100 mg by mouth 2 (two) times daily.    . promethazine (PHENERGAN) 25 MG tablet Take 25 mg by mouth every 8 (eight) hours as needed for nausea.     Marland Kitchen spironolactone (ALDACTONE) 25 MG tablet Take 25 mg by mouth 2 (two) times daily.    Marland Kitchen  tamoxifen (NOLVADEX) 10 MG tablet Take by mouth.    . triamcinolone (KENALOG) 0.1 % paste Place 1 application onto teeth 2 (two) times daily as needed (affected area on gums or mouth.). Apply to gums or mouth.    . triamterene-hydrochlorothiazide (MAXZIDE-25) 37.5-25 MG tablet Take 1 tablet by mouth daily.    Marland Kitchen zolpidem (AMBIEN) 10 MG tablet Take 10-15 mg by mouth at bedtime.     . cyclobenzaprine (FLEXERIL) 10 MG tablet Take 10 mg by mouth at bedtime. Muscle spasm     No current facility-administered medications on file prior to visit.      PAST SURGICAL HISTORY Past Surgical History:  Procedure Laterality Date  . BILATERAL KNEE ARTHROSCOPY    . BREAST SURGERY  2012,12   lumpectomy,mastectomy bil  . CHOLECYSTECTOMY    . ECTOPIC PREGNANCY SURGERY     x2  . gastric bypass surgery    . left ovary and fallopian tube removed due to chronic pain    . mastectomy  2013   bilateral  . MULTIPLE EXTRACTIONS WITH ALVEOLOPLASTY N/A 06/04/2012   Procedure: MULTIPLE EXTRACION WITH ALVEOLOPLASTY EXTRACT: 4, 5, 8, 9, 10, 13, 14 ,28;  Surgeon: Gae Bon, DDS;  Location: [place];  Service: Oral Surgery;  Laterality: N/A;  . PACEMAKER INSERTION  2011  . plastic surgery on face     for ptosis of rt. eyelid  . PORTACATH PLACEMENT     power port - right  . PPM  11/14/09   MDT Revo implanted by Dr Rayann Heman for syncope/ Mobitz II AV block    FAMILY HISTORY: Family History  Problem Relation Age of Onset  . Heart attack Father   . Breast cancer Sister   . Cancer Sister          brain  . Cancer Brother        lung  . Cancer Other        colon  . Brain cancer Sister     SOCIAL HISTORY:  reports that she quit smoking about 31 years ago. Her smoking use included Cigarettes. She has a 2.40 pack-year smoking history. She has never used smokeless tobacco. She reports that she does not drink alcohol or use drugs.  Her grandson lives with her.  Social History   Social History  . Marital status: Single    Spouse name: N/A  . Number of children: N/A  . Years of education: N/A   Social History Main Topics  . Smoking status: Former Smoker    Packs/day: 0.30    Years: 8.00    Types: Cigarettes    Quit date: 02/10/1985  . Smokeless tobacco: Never Used     Comment: Quit 25 years back.   . Alcohol use No  . Drug use: No  . Sexual activity: Not Asked   Other Topics Concern  . None   Social History Narrative   Lives with her children. Disabled.     PERFORMANCE STATUS: The patient's performance status is 2 - Symptomatic, <50% confined to bed  PHYSICAL EXAM: Most Recent Vital Signs: Blood pressure 139/73, pulse 77, temperature 98 F (36.7 C), temperature source Oral, resp. rate 16, SpO2 100 %. General appearance: alert, appears stated age, no distress, moderate distress, moderately obese and fatigued appearing, unaccompanied Head: Normocephalic, without obvious abnormality, atraumatic Eyes: negative findings: lids and lashes normal, conjunctivae and sclerae normal and corneas clear Neck: no adenopathy, supple, symmetrical, trachea midline and thyroid not enlarged, symmetric, no tenderness/mass/nodules Lungs:  clear to auscultation bilaterally and normal percussion bilaterally Heart: regular rate and rhythm, S1, S2 normal, no murmur, click, rub or gallop Abdomen: soft, non-tender; bowel sounds normal; no masses,  no organomegaly Extremities: extremities normal, atraumatic, no cyanosis or edema Skin: Skin color, texture, turgor normal. No rashes or  lesions Lymph nodes: Cervical, supraclavicular, and axillary nodes normal. Neurologic: AA0 x 3. 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  Stage IV disease was biopsy proven on bone biopsy of L-spine vertebra.  Past treatments have been discontinued due to intolerance: Arimidex, Femara, Aromasin, and Faslodex.  Currently on Tamoxifen/Herceptin/Xgeva.  Patient currently has another oncologist Dr. Jacquiline Doe who she follows with on a regular basis. She states she is willing to continue traveling 4 hours each way to [place], [place] in order to get her treatments. I have offered for her to transfer her care to here since it is a lot closer to her home, however she stated she wishes to continue with Dr. Jacquiline Doe. I have told her to continue follow up with him for her treatments and management of her breast cancer.   Since she has a primary oncologist, I will not give her a return appointment at this time.   All questions were answered. The patient knows to call the clinic with any problems, questions or concerns. We can certainly see the patient much sooner if necessary.  This note is electronically signed by Twana First, MD :08/11/2016 12:30 PM

## 2016-09-15 ENCOUNTER — Other Ambulatory Visit (HOSPITAL_COMMUNITY): Payer: Self-pay | Admitting: Pharmacist

## 2016-09-18 ENCOUNTER — Ambulatory Visit (HOSPITAL_COMMUNITY): Payer: Medicare Other

## 2016-09-18 ENCOUNTER — Encounter (HOSPITAL_COMMUNITY): Payer: Medicare Other | Attending: Oncology | Admitting: Oncology

## 2016-09-18 ENCOUNTER — Encounter (HOSPITAL_COMMUNITY): Payer: Medicare Other

## 2016-09-18 VITALS — BP 128/74 | HR 81 | Resp 16 | Ht 64.5 in | Wt 161.0 lb

## 2016-09-18 DIAGNOSIS — C7951 Secondary malignant neoplasm of bone: Principal | ICD-10-CM

## 2016-09-18 DIAGNOSIS — C50919 Malignant neoplasm of unspecified site of unspecified female breast: Secondary | ICD-10-CM | POA: Diagnosis present

## 2016-09-18 DIAGNOSIS — G894 Chronic pain syndrome: Secondary | ICD-10-CM

## 2016-09-18 DIAGNOSIS — Z720 Tobacco use: Secondary | ICD-10-CM | POA: Diagnosis not present

## 2016-09-18 DIAGNOSIS — R1901 Right upper quadrant abdominal swelling, mass and lump: Secondary | ICD-10-CM | POA: Insufficient documentation

## 2016-09-18 LAB — CBC WITH DIFFERENTIAL/PLATELET
BASOS ABS: 0 10*3/uL (ref 0.0–0.1)
Basophils Relative: 0 %
EOS PCT: 2 %
Eosinophils Absolute: 0.1 10*3/uL (ref 0.0–0.7)
HCT: 37.5 % (ref 36.0–46.0)
HEMOGLOBIN: 11.6 g/dL — AB (ref 12.0–15.0)
LYMPHS ABS: 1 10*3/uL (ref 0.7–4.0)
LYMPHS PCT: 21 %
MCH: 26.1 pg (ref 26.0–34.0)
MCHC: 30.9 g/dL (ref 30.0–36.0)
MCV: 84.5 fL (ref 78.0–100.0)
Monocytes Absolute: 0.4 10*3/uL (ref 0.1–1.0)
Monocytes Relative: 8 %
NEUTROS ABS: 3.3 10*3/uL (ref 1.7–7.7)
NEUTROS PCT: 69 %
PLATELETS: 238 10*3/uL (ref 150–400)
RBC: 4.44 MIL/uL (ref 3.87–5.11)
RDW: 17.5 % — ABNORMAL HIGH (ref 11.5–15.5)
WBC: 4.8 10*3/uL (ref 4.0–10.5)

## 2016-09-18 LAB — COMPREHENSIVE METABOLIC PANEL
ALK PHOS: 62 U/L (ref 38–126)
ALT: 14 U/L (ref 14–54)
AST: 19 U/L (ref 15–41)
Albumin: 3.6 g/dL (ref 3.5–5.0)
Anion gap: 7 (ref 5–15)
BUN: 12 mg/dL (ref 6–20)
CALCIUM: 9.1 mg/dL (ref 8.9–10.3)
CHLORIDE: 101 mmol/L (ref 101–111)
CO2: 31 mmol/L (ref 22–32)
CREATININE: 0.74 mg/dL (ref 0.44–1.00)
Glucose, Bld: 105 mg/dL — ABNORMAL HIGH (ref 65–99)
Potassium: 3.4 mmol/L — ABNORMAL LOW (ref 3.5–5.1)
Sodium: 139 mmol/L (ref 135–145)
Total Bilirubin: 0.5 mg/dL (ref 0.3–1.2)
Total Protein: 7.1 g/dL (ref 6.5–8.1)

## 2016-09-18 NOTE — Progress Notes (Signed)
[facility] Hematology/Oncology Consultation   Name: Ashlee Moore      MRN: [id number]    Date: 09/18/2016 Time:12:40 PM   REFERRING PHYSICIAN:  Leroy Kennedy, MD (Primary care provider)  REASON FOR CONSULT:  "to follow her metastatic breast cancer."   DIAGNOSIS:  ER/PR/HER2 POSITIVE metastatic breast cancer    Breast cancer metastasized to bone ([place])   01/01/2011 Procedure    Partial mastectomy and sentinel node biopsy      01/01/2011 Pathology Results    Poorly differentiated invasive ductal carcinoma, 2.0 cm with +LVI, measuring 2.0 cm.  0.1 cm of closest margin (deep) with positive margin of the superior and medial aspects with DCIS.  ER 80%, PR 75%, Ki-67 43%, HER2 3+ POSITIVE.      10/20/2011 Procedure    Right mastectomy and prophylactic left simple mastectomy by Dr. Margot Chimes.      10/20/2011 Pathology Results    Right invasive ductal carcinoma measuring 19.2 cm, grade II with angiolymphatic invasion and perineural invasion with negative resection margins.  Right axillary dissection shows 2/4 lymph nodes positive for disease with extracapsular extension.  ER 70%, PR 0%, HER2+ by [facility].      03/29/2012 PET scan    1.  Postoperative uptake in the anterior right chest wall without evidence of metastatic disease. 2.  Mildly hypermetabolic left level II lymph nodes may be reactive.      05/28/2012 Imaging    MR L-spine-  New abnormal L5 and S1/S2 bone lesions most compatible with metastatic disease to bone in this setting.      06/11/2012 Procedure    L5 and S1 bone biopsy by Dr. Estanislado Pandy with kyphoplasty      06/11/2012 Pathology Results    L5 bone biopsy is positive for metastatic carcinoma, ER+, HER2 POSITIVE.  S1 biopsy is negative for malignancy      08/16/2012 Initial Diagnosis    Breast cancer metastasized to bone ([place])     09/15/2012 Imaging    MRI L-Spine- T10 vertebral body lesion measuring approximately 2 x 2.4 cm with hypermetabolic activity  on recent PET-CT, most consistent with metastatic disease.      11/12/2012 PET scan    Sclerotic osseous metastases at T10-11, L5, and left sacrum/pelvis, grossly unchanged, max SUV 5.4.  Interval vertebral augmentation at L5 with left sacroplasty.  No evidence of new/progressive metastatic disease in the chest      07/20/2013 PET scan    1. Similar to slight progression of osseous metastasis. 2. Lingular patchy pulmonary opacity and hypermetabolism. Favored to represent an area of mild infection. 3. Right upper lobe subpleural ground-glass opacity and hypermetabolism. If the patient has had radiation in this area, evolving radiation change could have this appearance. A second focus of infection is felt slightly less likely. Not typical of metastatic disease. 4. Hypermetabolic right thyroid nodule. Given comorbidities, of questionable clinical significance. Recommend attention on follow-up.      09/23/2013 Procedure    T10 bone biopsy by Dr. Estanislado Pandy      09/23/2013 Pathology Results    T10 bone is positive for metastatic carcinoma ER MOSTLY NEGATIVE.      09/27/2013 Procedure    Status post radiofrequency ablation for tumor ablation at T10 followed by vertebral body augmentation by Dr. Estanislado Pandy      01/26/2014 Imaging    MRI T and L-spine- 1. Prior vertebral augmentation T10 and L5 and prior left sacroplasty. No evidence of new  thoracic or lumbar osseous metastases or epidural tumor. 2. Lesions in the sternum and left iliac wing as described on prior PET-CT. 3. Mild thoracic and lumbar spondylosis without spinal stenosis. Moderate to severe lower lumbar facet arthrosis.      03/28/2014 Imaging    MRI brain- Negative for intracranial metastasis or other explanation for headache.      07/31/2014 PET scan    Status post bilateral mastectomy with right axillary lymph node dissection.  10 mm short axis right superior mediastinal nodal metastasis, new.  Mild  progression of multifocal osseous metastases.      04/04/2015 PET scan    Status post bilateral mastectomy with right axillary lymph node dissection.  12 mm short axis right superior mediastinal nodal metastasis, mildly increased in size, but with decreased hypermetabolism.  Multifocal osseous metastases, overall with overall mildly decreased hypermetabolism.  No evidence of new/progressive metastatic disease.      04/09/2016 PET scan    1. Bony metastatic lesions are reduced in hypermetabolic activity compared to the prior exam, but not resolved. 2. The hypermetabolic upper mediastinal prevascular node is essentially stable in size and mildly increased in activity compared to the prior exam, SUV increase from 6.4 previously to 7.2 today. 3. No new metastatic lesions are identified. 4. Other imaging findings of potential clinical significance: Biliary dilatation, chronic. Postoperative findings in the stomach. Right lumbar hernia containing adipose tissue. Fullness of the right collecting system without a stone identified.       HISTORY OF PRESENT ILLNESS:   Ashlee Moore is a 56 y.o. 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 Lucy Antigua at [facility], Lumbar DDD, fibromyalgia, osteoarthritis, chronic pain, urinary incontinence, chronic headaches, allergic rhinitis, S/P gastric bypass for obesity, enlarged uterus with multiple leiomyomas,  who is referred to the [facility] for transfer of medical oncology care for ER/PR/HER2 POSITIVE metastatic breast cancer S/P right total mastectomy and prophylactic left mastectomy (10/03/2011) with past treatments consisting of letrozole, arimidex, aromasin, and faslodex with poor tolerance.  Herceptin has been continued since time of diagnosis.  She does admit to past radiation therapy following her initial diagnosis.   INTERVAL  HISTORY: Patient presents here for reestablishment of care. Her previous oncologist Dr. Jacquiline Doe has left his practice in [place]. She has been off her maintenance Herceptin for over a month now. She last received in June 2018. She currently also still takes tamoxifen 20 minutes by mouth daily. Patient states she went into the ED yesterday in [facility] for right upper quadrant pain and had an ultrasound performed and was told she had masses in her liver. She's had this right upper quadrant pain for about 3 weeks now. She denies any chest pain, shortness breath, changes in appetite. She complains of Lou[place] from the edema, however she has stopped taking all of her diuretics that her cardiologist have prescribed including spironolactone, Lasix, HCTZ.   Her last restaging imaging that we have his PET scan from 04/09/16 which showed reduction of her bony metastatic lesions in hypermetabolic activity, but not completely resolved. The hypermetabolic upper mediastinal prevascular node is stable in size mildly increase in activity with SUV increased from 6.4-7.2. No new metastatic lesions found.   Review of Systems  Constitutional: Negative.  Negative for chills, fever and weight loss.  HENT: Negative.   Eyes: Negative.  Negative for blurred vision and double vision.  Respiratory: Negative.  Negative for  cough and hemoptysis.   Cardiovascular: Positive for leg swelling. Negative for chest pain.  Gastrointestinal: Positive for abdominal pain (RUQ). Negative for constipation, diarrhea, nausea and vomiting.  Genitourinary: Negative.   Musculoskeletal: Negative for falls.  Skin: Negative.   Neurological: Negative for weakness and headaches.  Endo/Heme/Allergies: Negative.   Psychiatric/Behavioral: Negative.   14 point review of systems was performed and is negative except as detailed under history of present illness and above    PAST MEDICAL HISTORY:   Past Medical History:  Diagnosis Date  . Anemia   .  Arthritis   . AV block, Mobitz 2 11/14/09   s/p MDT Revo PPM by JA  . Bipolar disorder ([place])   . Breast cancer metastasized to bone ([place]) 08/16/2012  . Cancer ([place])    right breast/LAST CHEOM 10/07/11  . Chills   . Chronic pain 12/13/2015  . Easy bruising   . Elevated LFTs    hx  . Fever   . Fibromyalgia   . Generalized headaches   . GERD (gastroesophageal reflux disease)   . Hypertension   . Morbid obesity with body mass index of 40.0-44.9 in adult ([place]) 10/22/2011   BMI 44.26    . Osteoporosis   . Pacemaker   . Sleep apnea    STOP BANG SCORE 4  . Weakness     ALLERGIES: Allergies  Allergen Reactions  . Penicillins Swelling  . Sulfonamide Derivatives Rash  . Tape Other (See Comments)    Adhesive tape- RASH, SWELLING   All adhesives      MEDICATIONS: I have reviewed the patient's current medications.    Current Outpatient Prescriptions on File Prior to Visit  Medication Sig Dispense Refill  . aspirin 81 MG EC tablet Take 81 mg by mouth daily.     . Calcium Carbonate-Vitamin D 600-400 MG-UNIT per tablet Take 1 tablet by mouth daily.     . clonazePAM (KLONOPIN) 1 MG tablet Take 1 mg by mouth 2 (two) times daily as needed for anxiety.     . clotrimazole-betamethasone (LOTRISONE) cream Apply 1 application topically 2 (two) times daily as needed (itching).     . cyclobenzaprine (FLEXERIL) 10 MG tablet Take 10 mg by mouth at bedtime. Muscle spasm    . diclofenac sodium (VOLTAREN) 1 % GEL Place 2 g onto the skin 3 (three) times daily as needed (arthritis).     . DULoxetine (CYMBALTA) 60 MG capsule Take 60 mg by mouth every evening.     . fentaNYL (DURAGESIC - DOSED MCG/HR) 100 MCG/HR Place 400 mcg onto the skin every 3 (three) days.     . furosemide (LASIX) 40 MG tablet Take 40 mg by mouth.    . hydroxypropyl methylcellulose (ISOPTO TEARS) 2.5 % ophthalmic solution Place 1 drop into both eyes 4 (four) times daily as needed (dry eyes).    Marland Kitchen lidocaine-prilocaine (EMLA) cream  Apply 1 application topically as needed (prior to chemo). For port (2.5%/2/5%)    . Multiple Vitamin (THERA) TABS Take 1 tablet by mouth at bedtime.     Marland Kitchen omeprazole (PRILOSEC) 20 MG capsule Take 20 mg by mouth at bedtime.     . Oxycodone HCl 20 MG TABS Take 40 mg by mouth every 3 (three) hours.     . potassium chloride SA (K-DUR,KLOR-CON) 20 MEQ tablet Take 20 mEq by mouth daily.     . pregabalin (LYRICA) 100 MG capsule Take 100 mg by mouth 2 (two) times daily.    Marland Kitchen  promethazine (PHENERGAN) 25 MG tablet Take 25 mg by mouth every 8 (eight) hours as needed for nausea.     Marland Kitchen spironolactone (ALDACTONE) 25 MG tablet Take 25 mg by mouth 2 (two) times daily.    . tamoxifen (NOLVADEX) 10 MG tablet Take by mouth.    . triamcinolone (KENALOG) 0.1 % paste Place 1 application onto teeth 2 (two) times daily as needed (affected area on gums or mouth.). Apply to gums or mouth.    . triamterene-hydrochlorothiazide (MAXZIDE-25) 37.5-25 MG tablet Take 1 tablet by mouth daily.    Marland Kitchen zolpidem (AMBIEN) 10 MG tablet Take 10-15 mg by mouth at bedtime.      No current facility-administered medications on file prior to visit.      PAST SURGICAL HISTORY Past Surgical History:  Procedure Laterality Date  . BILATERAL KNEE ARTHROSCOPY    . BREAST SURGERY  2012,12   lumpectomy,mastectomy bil  . CHOLECYSTECTOMY    . ECTOPIC PREGNANCY SURGERY     x2  . gastric bypass surgery    . left ovary and fallopian tube removed due to chronic pain    . mastectomy  2013   bilateral  . MULTIPLE EXTRACTIONS WITH ALVEOLOPLASTY N/A 06/04/2012   Procedure: MULTIPLE EXTRACION WITH ALVEOLOPLASTY EXTRACT: 4, 5, 8, 9, 10, 13, 14 ,28;  Surgeon: Gae Bon, DDS;  Location: [facility];  Service: Oral Surgery;  Laterality: N/A;  . PACEMAKER INSERTION  2011  . plastic surgery on face     for ptosis of rt. eyelid  . PORTACATH PLACEMENT     power port - right  . PPM  11/14/09   MDT Revo implanted by Dr Rayann Heman for syncope/ Mobitz II AV block      FAMILY HISTORY: Family History  Problem Relation Age of Onset  . Heart attack Father   . Breast cancer Sister   . Cancer Sister        brain  . Cancer Brother        lung  . Cancer Other        colon  . Brain cancer Sister     SOCIAL HISTORY:  reports that she quit smoking about 31 years ago. Her smoking use included Cigarettes. She has a 2.40 pack-year smoking history. She has never used smokeless tobacco. She reports that she does not drink alcohol or use drugs.  Her grandson lives with her.  Social History   Social History  . Marital status: Single    Spouse name: N/A  . Number of children: N/A  . Years of education: N/A   Social History Main Topics  . Smoking status: Former Smoker    Packs/day: 0.30    Years: 8.00    Types: Cigarettes    Quit date: 02/10/1985  . Smokeless tobacco: Never Used     Comment: Quit 25 years back.   . Alcohol use No  . Drug use: No  . Sexual activity: Not on file   Other Topics Concern  . Not on file   Social History Narrative   Lives with her children. Disabled.     PERFORMANCE STATUS: The patient's performance status is 2 - Symptomatic, <50% confined to bed  PHYSICAL EXAM: Most Recent Vital Signs: Blood pressure 128/74, pulse 81, resp. rate 16, height 5' 4.5" (1.638 m), weight 161 lb (73 kg), SpO2 100 %. General appearance: alert, appears stated age, no distress, moderate distress, moderately obese and fatigued appearing, unaccompanied Head: Normocephalic, without obvious abnormality, atraumatic Eyes:  negative findings: lids and lashes normal, conjunctivae and sclerae normal and corneas clear Neck: no adenopathy, supple, symmetrical, trachea midline and thyroid not enlarged, symmetric, no tenderness/mass/nodules Lungs: clear to auscultation bilaterally and normal percussion bilaterally Heart: regular rate and rhythm, S1, S2 normal, no murmur, click, rub or gallop. +2+ pitting edema Abdomen: soft, nondistended, bowel sounds  normal; no masses,  no organomegaly. RUQ TTP Extremities: extremities normal, atraumatic, no cyanosis or edema Skin: Skin color, texture, turgor normal. No rashes or lesions Lymph nodes: Cervical, supraclavicular, and axillary nodes normal. Neurologic: AA0 x 3. Grossly normal   ASSESSMENT/PLAN:   1. Breast cancer metastasized to bone ([place]) HER 2 positive ER+  ER/PR positive, HER2 POSITIVE metastatic breast cancer to bone, initially diagnosed with a Stage IIA right invasive breast cancer in 2012 treatment with lumpectomy and XRT with recurrent disease (Stage IIIB) on the right in 2013.  Stage IV disease was biopsy proven on bone biopsy of L-spine vertebra.  Past treatments have been discontinued due to intolerance: Arimidex, Femara, Aromasin, and Faslodex.  Currently on Tamoxifen/Herceptin every 21 days/Xgeva q2 months.  -I have reviewed her medical records from her previous oncologist. -Concern about disease progression and possible liver metastases. I have ordered a restaging CT chest/abdomen/pelvis to be performed ASAP. -I have ordered an echocardiogram to assess cardiac function since she's been on Herceptin for years now. -Patient has been on maintenance Herceptin for over 7 years now. If she does not have evidence of disease progression on CT then I will plan to continue her Herceptin if her ejection fraction is acceptable. However if she does have evidence of disease progression on CT, I will plan to change her therapy to Myeloma 3.6 mg/kg IV every 21 Days. -Continue tamoxifen for ER positive disease. -Check CBC, CMP, CA 15.3, CA 27-29.  2. Chronic pain syndrome on chronic narcotics  - We will not be prescribing her pain medications. Patient outside urine drug screen performed in April 2018, from the medical records that I received from her previous oncologist, and she is tested positive for cocaine and was negative for opioids, despite the fact that she is supposedly on fentanyl  patches. -Defer her pain management to her primary care physician. We'll also defer all of her anti-anxiety meds to be filled by her primary care physician as well.   3. LE edema  -Advised patient to get back onto all of her diuretics. She does have a follow-up with her cardiologist next week.  Dispo: Return to clinic on 09/24/16 to review her restaging scan results and to discuss next plan of care. Her echocardiogram and restaging CT scans are currently scheduled for 09/19/16.  All questions were answered. The patient knows to call the clinic with any problems, questions or concerns. We can certainly see the patient much sooner if necessary.  This note is electronically signed by Twana First, MD :09/18/2016 12:40 PM

## 2016-09-19 ENCOUNTER — Ambulatory Visit (HOSPITAL_COMMUNITY)
Admission: RE | Admit: 2016-09-19 | Discharge: 2016-09-19 | Disposition: A | Discharge status: Home / Self Care | Payer: Medicare Other | Source: Ambulatory Visit | Attending: Oncology | Admitting: Oncology

## 2016-09-19 ENCOUNTER — Other Ambulatory Visit (HOSPITAL_COMMUNITY): Payer: Medicare Other

## 2016-09-19 DIAGNOSIS — R1900 Intra-abdominal and pelvic swelling, mass and lump, unspecified site: Secondary | ICD-10-CM | POA: Diagnosis not present

## 2016-09-19 DIAGNOSIS — M8448XD Pathological fracture, other site, subsequent encounter for fracture with routine healing: Secondary | ICD-10-CM | POA: Diagnosis not present

## 2016-09-19 DIAGNOSIS — C50919 Malignant neoplasm of unspecified site of unspecified female breast: Secondary | ICD-10-CM | POA: Insufficient documentation

## 2016-09-19 DIAGNOSIS — C7951 Secondary malignant neoplasm of bone: Secondary | ICD-10-CM | POA: Diagnosis present

## 2016-09-19 DIAGNOSIS — R918 Other nonspecific abnormal finding of lung field: Secondary | ICD-10-CM | POA: Diagnosis not present

## 2016-09-19 DIAGNOSIS — M545 Low back pain: Secondary | ICD-10-CM | POA: Insufficient documentation

## 2016-09-19 DIAGNOSIS — K838 Other specified diseases of biliary tract: Secondary | ICD-10-CM | POA: Insufficient documentation

## 2016-09-19 LAB — ECHOCARDIOGRAM COMPLETE
AVLVOTPG: 7 mmHg
CHL CUP DOP CALC LVOT VTI: 27.6 cm
CHL CUP MV DEC (S): 239
CHL CUP TV REG PEAK VELOCITY: 260 cm/s
EERAT: 6.99
EWDT: 239 ms
FS: 40 % (ref 28–44)
IV/PV OW: 1.04
LA ID, A-P, ES: 38 mm
LA diam end sys: 38 mm
LA diam index: 2.06 cm/m2
LA vol index: 25.1 mL/m2
LA vol: 46.2 mL
LAVOLA4C: 50.6 mL
LDCA: 4.52 cm2
LV E/e'average: 6.99
LV PW d: 8.74 mm — AB (ref 0.6–1.1)
LV TDI E'LATERAL: 13.4
LV TDI E'MEDIAL: 10.6
LV dias vol index: 44 mL/m2
LV e' LATERAL: 13.4 cm/s
LV sys vol index: 14 mL/m2
LV sys vol: 27 mL
LVDIAVOL: 80 mL (ref 46–106)
LVEEMED: 6.99
LVOT SV: 125 mL
LVOT diameter: 24 mm
LVOTPV: 135 cm/s
Lateral S' vel: 15.2 cm/s
MV Peak grad: 4 mmHg
MV pk E vel: 93.6 m/s
MVPKAVEL: 74 m/s
RV sys press: 35 mmHg
Simpson's disk: 67
Stroke v: 54 ml
TAPSE: 26.1 mm
TR max vel: 260 cm/s

## 2016-09-19 LAB — CANCER ANTIGEN 15-3: CAN 15 3: 84.4 U/mL — AB (ref 0.0–25.0)

## 2016-09-19 LAB — CANCER ANTIGEN 27.29: CA 27.29: 96.5 U/mL — ABNORMAL HIGH (ref 0.0–38.6)

## 2016-09-19 MED ORDER — IOPAMIDOL (ISOVUE-300) INJECTION 61%
100.0000 mL | Freq: Once | INTRAVENOUS | Status: AC | PRN
Start: 2016-09-19 — End: 2016-09-19
  Administered 2016-09-19: 100 mL via INTRAVENOUS

## 2016-09-19 NOTE — Progress Notes (Signed)
*  PRELIMINARY RESULTS* Echocardiogram 2D Echocardiogram has been performed.  Samuel Germany 09/19/2016, 11:22 AM

## 2016-09-24 ENCOUNTER — Ambulatory Visit (HOSPITAL_COMMUNITY): Payer: Medicare Other

## 2016-09-29 ENCOUNTER — Encounter (HOSPITAL_BASED_OUTPATIENT_CLINIC_OR_DEPARTMENT_OTHER): Payer: Medicare Other | Admitting: Oncology

## 2016-09-29 ENCOUNTER — Encounter (HOSPITAL_COMMUNITY): Payer: Self-pay

## 2016-09-29 VITALS — BP 127/74 | HR 88 | Resp 18 | Ht 64.5 in | Wt 157.0 lb

## 2016-09-29 DIAGNOSIS — R1901 Right upper quadrant abdominal swelling, mass and lump: Secondary | ICD-10-CM

## 2016-09-29 DIAGNOSIS — C50919 Malignant neoplasm of unspecified site of unspecified female breast: Secondary | ICD-10-CM

## 2016-09-29 DIAGNOSIS — Z170 Estrogen receptor positive status [ER+]: Secondary | ICD-10-CM | POA: Diagnosis not present

## 2016-09-29 DIAGNOSIS — C7951 Secondary malignant neoplasm of bone: Secondary | ICD-10-CM

## 2016-09-29 DIAGNOSIS — R600 Localized edema: Secondary | ICD-10-CM

## 2016-09-29 DIAGNOSIS — C7800 Secondary malignant neoplasm of unspecified lung: Secondary | ICD-10-CM | POA: Diagnosis not present

## 2016-09-29 DIAGNOSIS — G894 Chronic pain syndrome: Secondary | ICD-10-CM

## 2016-09-29 NOTE — Progress Notes (Signed)
[facility] Hematology/Oncology Consultation   Name: Ashlee Moore      MRN: [id number]    Date: 09/29/2016 Time:12:33 PM   REFERRING PHYSICIAN:  Leroy Kennedy, MD (Primary care provider)  REASON FOR CONSULT:  "to follow her metastatic breast cancer."   DIAGNOSIS:  ER/PR/HER2 POSITIVE metastatic breast cancer    Breast cancer metastasized to bone ([place])   01/01/2011 Procedure    Partial mastectomy and sentinel node biopsy      01/01/2011 Pathology Results    Poorly differentiated invasive ductal carcinoma, 2.0 cm with +LVI, measuring 2.0 cm.  0.1 cm of closest margin (deep) with positive margin of the superior and medial aspects with DCIS.  ER 80%, PR 75%, Ki-67 43%, HER2 3+ POSITIVE.      10/20/2011 Procedure    Right mastectomy and prophylactic left simple mastectomy by Dr. Margot Chimes.      10/20/2011 Pathology Results    Right invasive ductal carcinoma measuring 19.2 cm, grade II with angiolymphatic invasion and perineural invasion with negative resection margins.  Right axillary dissection shows 2/4 lymph nodes positive for disease with extracapsular extension.  ER 70%, PR 0%, HER2+ by C[facility].      03/29/2012 PET scan    1.  Postoperative uptake in the anterior right chest wall without evidence of metastatic disease. 2.  Mildly hypermetabolic left level II lymph nodes may be reactive.      05/28/2012 Imaging    MR L-spine-  New abnormal L5 and S1/S2 bone lesions most compatible with metastatic disease to bone in this setting.      06/11/2012 Procedure    L5 and S1 bone biopsy by Dr. Estanislado Pandy with kyphoplasty      06/11/2012 Pathology Results    L5 bone biopsy is positive for metastatic carcinoma, ER+, HER2 POSITIVE.  S1 biopsy is negative for malignancy      08/16/2012 Initial Diagnosis    Breast cancer metastasized to bone ([place])     09/15/2012 Imaging    MRI L-Spine- T10 vertebral body lesion measuring approximately 2 x 2.4 cm with hypermetabolic activity  on recent PET-CT, most consistent with metastatic disease.      11/12/2012 PET scan    Sclerotic osseous metastases at T10-11, L5, and left sacrum/pelvis, grossly unchanged, max SUV 5.4.  Interval vertebral augmentation at L5 with left sacroplasty.  No evidence of new/progressive metastatic disease in the chest      07/20/2013 PET scan    1. Similar to slight progression of osseous metastasis. 2. Lingular patchy pulmonary opacity and hypermetabolism. Favored to represent an area of mild infection. 3. Right upper lobe subpleural ground-glass opacity and hypermetabolism. If the patient has had radiation in this area, evolving radiation change could have this appearance. A second focus of infection is felt slightly less likely. Not typical of metastatic disease. 4. Hypermetabolic right thyroid nodule. Given comorbidities, of questionable clinical significance. Recommend attention on follow-up.      09/23/2013 Procedure    T10 bone biopsy by Dr. Estanislado Pandy      09/23/2013 Pathology Results    T10 bone is positive for metastatic carcinoma ER MOSTLY NEGATIVE.      09/27/2013 Procedure    Status post radiofrequency ablation for tumor ablation at T10 followed by vertebral body augmentation by Dr. Estanislado Pandy      01/26/2014 Imaging    MRI T and L-spine- 1. Prior vertebral augmentation T10 and L5 and prior left sacroplasty. No evidence of new  thoracic or lumbar osseous metastases or epidural tumor. 2. Lesions in the sternum and left iliac wing as described on prior PET-CT. 3. Mild thoracic and lumbar spondylosis without spinal stenosis. Moderate to severe lower lumbar facet arthrosis.      03/28/2014 Imaging    MRI brain- Negative for intracranial metastasis or other explanation for headache.      07/31/2014 PET scan    Status post bilateral mastectomy with right axillary lymph node dissection.  10 mm short axis right superior mediastinal nodal metastasis, new.  Mild  progression of multifocal osseous metastases.      04/04/2015 PET scan    Status post bilateral mastectomy with right axillary lymph node dissection.  12 mm short axis right superior mediastinal nodal metastasis, mildly increased in size, but with decreased hypermetabolism.  Multifocal osseous metastases, overall with overall mildly decreased hypermetabolism.  No evidence of new/progressive metastatic disease.      04/09/2016 PET scan    1. Bony metastatic lesions are reduced in hypermetabolic activity compared to the prior exam, but not resolved. 2. The hypermetabolic upper mediastinal prevascular node is essentially stable in size and mildly increased in activity compared to the prior exam, SUV increase from 6.4 previously to 7.2 today. 3. No new metastatic lesions are identified. 4. Other imaging findings of potential clinical significance: Biliary dilatation, chronic. Postoperative findings in the stomach. Right lumbar hernia containing adipose tissue. Fullness of the right collecting system without a stone identified.      09/19/2016 Imaging    CT C/A/P IMPRESSION: CT CHEST  1. Enlarging bilateral pulmonary nodules concerning for progressive metastatic disease. 2. Stable osseous metastatic disease without significant interval change. 3. Healing pathologic fractures.  No new fracture identified. 4. Additional ancillary findings as above without significant interval change.  CT ABD/PELVIS  1. Fatty, minimally complex soft tissue mass in the right posterolateral abdominal wall musculature centered in the external oblique muscle measures 5.4 x 5.1 cm. While this lesion demonstrates very little interval change compared to prior imaging from earlier this year, there has been definite enlargement compared to more remote prior studies from 2014. While this may simply represent a lipoma, the mild internal complexity raises the possibility of a low grade liposarcoma.  Additionally, this may correlate with the site of the patient's reported right/lower back pain. This region would be amenable to percutaneous biopsy if deemed clinically warranted. 2. Stable osseous metastatic disease with evidence of prior cement augmentation in the left sacral ala and L5 vertebral body. 3. Stable intra and extrahepatic biliary ductal dilatation. 4. Additional ancillary findings as above without significant interval change.       09/19/2016 Echocardiogram    EF 60-65%       HISTORY OF PRESENT ILLNESS:   JAKI STEPTOE is a 56 y.o. 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 Lucy Antigua at [facility], Lumbar DDD, fibromyalgia, osteoarthritis, chronic pain, urinary incontinence, chronic headaches, allergic rhinitis, S/P gastric bypass for obesity, enlarged uterus with multiple leiomyomas,  who is referred to the [facility] for transfer of medical oncology care for ER/PR/HER2 POSITIVE metastatic breast cancer S/P right total mastectomy and prophylactic left mastectomy (10/03/2011) with past treatments consisting of letrozole, arimidex, aromasin, and faslodex with poor tolerance.  Herceptin has been continued since time of diagnosis.  She does admit to past radiation therapy following her initial diagnosis.   INTERVAL HISTORY: Patient presents here for reestablishment  of care. Her previous oncologist Dr. Jacquiline Doe has left his practice in [place]. She has been off her maintenance Herceptin for over a month now. She last received in June 2018. She currently also still takes tamoxifen 20 minutes by mouth daily. Patient states she went into the ED yesterday in [facility] for right upper quadrant pain and had an ultrasound performed and was told she had masses in her liver. She's had this right upper quadrant pain for about 3 weeks now. She denies any chest pain, shortness breath,  changes in appetite. She complains of Loucks from the edema, however she has stopped taking all of her diuretics that her cardiologist have prescribed including spironolactone, Lasix, HCTZ.   Her last restaging imaging that we have his PET scan from 04/09/16 which showed reduction of her bony metastatic lesions in hypermetabolic activity, but not completely resolved. The hypermetabolic upper mediastinal prevascular node is stable in size mildly increase in activity with SUV increased from 6.4-7.2. No new metastatic lesions found.  09/29/16: Patient presents today for continued follow-up. She expresses pain in her right upper quadrant from the palpable mass. She states the pain radiates to her back and she cannot get comfortable. Otherwise she has no new complaints.  Review of Systems  Constitutional: Negative.  Negative for chills, fever and weight loss.  HENT: Negative.   Eyes: Negative.  Negative for blurred vision and double vision.  Respiratory: Negative.  Negative for cough and hemoptysis.   Cardiovascular: Negative for chest pain and leg swelling.  Gastrointestinal: Positive for abdominal pain (RUQ). Negative for constipation, diarrhea, nausea and vomiting.  Genitourinary: Negative.   Musculoskeletal: Negative for falls.  Skin: Negative.   Neurological: Negative for weakness and headaches.  Endo/Heme/Allergies: Negative.   Psychiatric/Behavioral: Negative.   14 point review of systems was performed and is negative except as detailed under history of present illness and above    PAST MEDICAL HISTORY:   Past Medical History:  Diagnosis Date  . Anemia   . Arthritis   . AV block, Mobitz 2 11/14/09   s/p MDT Revo PPM by JA  . Bipolar disorder ([place])   . Breast cancer metastasized to bone ([place]) 08/16/2012  . Cancer ([place])    right breast/LAST CHEOM 10/07/11  . Chills   . Chronic pain 12/13/2015  . Easy bruising   . Elevated LFTs    hx  . Fever   . Fibromyalgia   . Generalized  headaches   . GERD (gastroesophageal reflux disease)   . Hypertension   . Morbid obesity with body mass index of 40.0-44.9 in adult ([place]) 10/22/2011   BMI 44.26    . Osteoporosis   . Pacemaker   . Sleep apnea    STOP BANG SCORE 4  . Weakness     ALLERGIES: Allergies  Allergen Reactions  . Penicillins Swelling  . Sulfonamide Derivatives Rash  . Tape Other (See Comments)    Adhesive tape- RASH, SWELLING   All adhesives      MEDICATIONS: I have reviewed the patient's current medications.    Current Outpatient Prescriptions on File Prior to Visit  Medication Sig Dispense Refill  . aspirin 81 MG EC tablet Take 81 mg by mouth daily.     . Calcium Carbonate-Vitamin D 600-400 MG-UNIT per tablet Take 1 tablet by mouth daily.     . clonazePAM (KLONOPIN) 1 MG tablet Take 1 mg by mouth 2 (two) times daily as needed for anxiety.     . clotrimazole-betamethasone (LOTRISONE)  cream Apply 1 application topically 2 (two) times daily as needed (itching).     . cyclobenzaprine (FLEXERIL) 10 MG tablet Take 10 mg by mouth at bedtime. Muscle spasm    . diclofenac sodium (VOLTAREN) 1 % GEL Place 2 g onto the skin 3 (three) times daily as needed (arthritis).     . DULoxetine (CYMBALTA) 60 MG capsule Take 60 mg by mouth every evening.     . fentaNYL (DURAGESIC - DOSED MCG/HR) 100 MCG/HR Place 400 mcg onto the skin every 3 (three) days.     . furosemide (LASIX) 40 MG tablet Take 40 mg by mouth.    . hydroxypropyl methylcellulose (ISOPTO TEARS) 2.5 % ophthalmic solution Place 1 drop into both eyes 4 (four) times daily as needed (dry eyes).    Marland Kitchen lidocaine-prilocaine (EMLA) cream Apply 1 application topically as needed (prior to chemo). For port (2.5%/2/5%)    . Multiple Vitamin (THERA) TABS Take 1 tablet by mouth at bedtime.     Marland Kitchen omeprazole (PRILOSEC) 20 MG capsule Take 20 mg by mouth at bedtime.     . Oxycodone HCl 20 MG TABS Take 40 mg by mouth every 3 (three) hours.     . potassium chloride SA  (K-DUR,KLOR-CON) 20 MEQ tablet Take 20 mEq by mouth daily.     . pregabalin (LYRICA) 100 MG capsule Take 100 mg by mouth 2 (two) times daily.    . promethazine (PHENERGAN) 25 MG tablet Take 25 mg by mouth every 8 (eight) hours as needed for nausea.     Marland Kitchen spironolactone (ALDACTONE) 25 MG tablet Take 25 mg by mouth 2 (two) times daily.    . tamoxifen (NOLVADEX) 10 MG tablet Take by mouth.    . triamcinolone (KENALOG) 0.1 % paste Place 1 application onto teeth 2 (two) times daily as needed (affected area on gums or mouth.). Apply to gums or mouth.    . triamterene-hydrochlorothiazide (MAXZIDE-25) 37.5-25 MG tablet Take 1 tablet by mouth daily.    Marland Kitchen zolpidem (AMBIEN) 10 MG tablet Take 10-15 mg by mouth at bedtime.      No current facility-administered medications on file prior to visit.      PAST SURGICAL HISTORY Past Surgical History:  Procedure Laterality Date  . BILATERAL KNEE ARTHROSCOPY    . BREAST SURGERY  2012,12   lumpectomy,mastectomy bil  . CHOLECYSTECTOMY    . ECTOPIC PREGNANCY SURGERY     x2  . gastric bypass surgery    . left ovary and fallopian tube removed due to chronic pain    . mastectomy  2013   bilateral  . MULTIPLE EXTRACTIONS WITH ALVEOLOPLASTY N/A 06/04/2012   Procedure: MULTIPLE EXTRACION WITH ALVEOLOPLASTY EXTRACT: 4, 5, 8, 9, 10, 13, 14 ,28;  Surgeon: Gae Bon, DDS;  Location: [facility];  Service: Oral Surgery;  Laterality: N/A;  . PACEMAKER INSERTION  2011  . plastic surgery on face     for ptosis of rt. eyelid  . PORTACATH PLACEMENT     power port - right  . PPM  11/14/09   MDT Revo implanted by Dr Rayann Heman for syncope/ Mobitz II AV block    FAMILY HISTORY: Family History  Problem Relation Age of Onset  . Heart attack Father   . Breast cancer Sister   . Cancer Sister        brain  . Cancer Brother        lung  . Cancer Other  colon  . Brain cancer Sister     SOCIAL HISTORY:  reports that she quit smoking about 31 years ago. Her smoking  use included Cigarettes. She has a 2.40 pack-year smoking history. She has never used smokeless tobacco. She reports that she does not drink alcohol or use drugs.  Her grandson lives with her.  Social History   Social History  . Marital status: Single    Spouse name: N/A  . Number of children: N/A  . Years of education: N/A   Social History Main Topics  . Smoking status: Former Smoker    Packs/day: 0.30    Years: 8.00    Types: Cigarettes    Quit date: 02/10/1985  . Smokeless tobacco: Never Used     Comment: Quit 25 years back.   . Alcohol use No  . Drug use: No  . Sexual activity: Not Asked   Other Topics Concern  . None   Social History Narrative   Lives with her children. Disabled.    Results for NENA, HAMPE (MRN [id number]) as of 09/29/2016 12:33  Ref. Range 09/18/2016 10:55  CA 15-3 Latest Ref Range: 0.0 - 25.0 U/mL 84.4 (H)  CA 27.29 Latest Ref Range: 0.0 - 38.6 U/mL 96.5 (H)   PERFORMANCE STATUS: The patient's performance status is 2 - Symptomatic, <50% confined to bed  PHYSICAL EXAM: Most Recent Vital Signs: Blood pressure 127/74, pulse 88, resp. rate 18, height 5' 4.5" (1.638 m), weight 157 lb (71.2 kg), SpO2 99 %. General appearance: alert, appears stated age, no distress, moderate distress, moderately obese and fatigued appearing, unaccompanied Head: Normocephalic, without obvious abnormality, atraumatic Eyes: negative findings: lids and lashes normal, conjunctivae and sclerae normal and corneas clear Neck: no adenopathy, supple, symmetrical, trachea midline and thyroid not enlarged, symmetric, no tenderness/mass/nodules Lungs: clear to auscultation bilaterally and normal percussion bilaterally Heart: regular rate and rhythm, S1, S2 normal, no murmur, click, rub or gallop. +2 pitting edema Abdomen: soft, nondistended, bowel sounds normal; no masses,  no organomegaly. RUQ TTP, palpable mass roughly 5.5 cm.  Extremities: extremities normal, atraumatic, no cyanosis  or edema Skin: Skin color, texture, turgor normal. No rashes or lesions Lymph nodes: Cervical, supraclavicular, and axillary nodes normal. Neurologic: AA0 x 3. Grossly normal   ASSESSMENT/PLAN:   1. Breast cancer metastasized to bone ([place]) HER 2 positive ER+  ER/PR positive, HER2 POSITIVE metastatic breast cancer to bone, initially diagnosed with a Stage IIA right invasive breast cancer in 2012 treatment with lumpectomy and XRT with recurrent disease (Stage IIIB) on the right in 2013.  Stage IV disease was biopsy proven on bone biopsy of L-spine vertebra.  Past treatments have been discontinued due to intolerance: Arimidex, Femara, Aromasin, and Faslodex.  Currently on Tamoxifen/Herceptin every 21 days/Xgeva q2 months.  -I have reviewed patient's CT scans in detail with her. There is concern for progressive disease with new pulmonary mets. Unclear whether the RUQ soft tissue mass is related to the breast cancer vs. Lipoma vs liposarcoma. I have placed a stat IR consult for biopsy of this RUQ abdominal mass which is easily palpable.  -I have discussed starting treatment with kadcyla q21 days. I have reviewed side effects of kadcyla with the patient and have provided her with reading material. She will need formal chemo education by our nurse navigator.  -Continue xgeva q2 months for her bone mets. Her bone mets are stable on CT. -Plan to repeat restaging scans after cycle 3 of kadcyla. -Continue tamoxifen.  2.  Chronic pain syndrome on chronic narcotics  - We will not be prescribing her pain medications. Patient outside urine drug screen performed in April 2018, from the medical records that I received from her previous oncologist, and she is tested positive for cocaine and was negative for opioids, despite the fact that she is supposedly on fentanyl patches. -Defer her pain management to her primary care physician. We'll also defer all of her anti-anxiety meds to be filled by her primary care  physician as well. Patient states she is interested in switching to a new PCP. I have given her info on Dr. Moshe Cipro and Dr. Meda Coffee who may be good options for her.   3. LE edema  -Advised patient to get back onto all of her diuretics.   Orders Placed This Encounter  Procedures  . CT Biopsy    Standing Status:   Future    Standing Expiration Date:   09/29/2017    Order Specific Question:   Lab orders requested (DO NOT place separate lab orders, these will be automatically ordered during procedure specimen collection):    Answer:   Surgical Pathology    Order Specific Question:   Reason for Exam (SYMPTOM  OR DIAGNOSIS REQUIRED)    Answer:   RUQ palpable mass, r/o liposarcoma    Order Specific Question:   Is patient pregnant?    Answer:   No    Order Specific Question:   Preferred imaging location?    Answer:   [facility]    Order Specific Question:   Radiology Contrast Protocol - do NOT remove file path    Answer:   [email or web address]  . CBC with Differential    Standing Status:   Standing    Number of Occurrences:   20    Standing Expiration Date:   09/30/2017  . Comprehensive metabolic panel    Standing Status:   Standing    Number of Occurrences:   20    Standing Expiration Date:   09/30/2017  . CBC with Differential [facility])    Standing Status:   Standing    Number of Occurrences:   20    Standing Expiration Date:   09/30/2017  . Comprehensive metabolic panel    Standing Status:   Standing    Number of Occurrences:   20    Standing Expiration Date:   09/30/2017  . CBC with Differential    Standing Status:   Standing    Number of Occurrences:   20    Standing Expiration Date:   09/30/2017  . Comprehensive metabolic panel    Standing Status:   Standing    Number of Occurrences:   20    Standing Expiration Date:   09/30/2017  . PHYSICIAN COMMUNICATION ORDER    A baseline Echo/Muga should be obtained prior to initiation of Herceptin,  at 3, 6, 9 months during Herceptin  Treatment.   Dispo:RTC in 3 weeks for follow up.  All questions were answered. The patient knows to call the clinic with any problems, questions or concerns. We can certainly see the patient much sooner if necessary.  This note is electronically signed by Twana First, MD :09/29/2016 12:33 PM

## 2016-09-30 NOTE — Patient Instructions (Addendum)
Ashlee Moore   CHEMOTHERAPY INSTRUCTIONS  Kadcyla - This medication is being given to treat your HER2-positive breast cancer that has spread to other parts of the body (metastatic breast cancer). This medication is being given for palliative measures.   Kadcyla is given intravenously (IV) every 21 days. This medication takes 90 minutes to infuse the first time and then 30 minutes thereafter. Prior to receiving Kadcyla we will administer Benadryl 15m and Tylenol 6572morally. We will check blood work prior to every Kadcyla infusion.  Ka[employer]ward Drones both a HER2 targeted treatment and chemotherapy. Kadcyla finds HER2 positive cells and attaches to them. It tells the cells to stop growing and tells the body's immune system to destroy them. Kadcyla also goes inside the cell to keep fighting from the inside. Kadcyla releases the chemotherapy inside the cell. The chemotherapy goes to work inside the cell, causing the cell to die.   The most common side effects seen in people taking KADCYLA were: Tiredness  Nausea  Pain that affects the bones, muscles, ligaments, and tendons  Bleeding  Low platelet count  Headache  Liver problems  Constipation  Nosebleeds  What are the most common side effects of KADCYLA? The most common severe side effects of KADCYLA are: Low platelet count  Liver problems  Low levels of red blood cells  Nerve problems  Low levels of potassium in the blood  Tiredness   Infusion-related reactions Symptoms of an infusion-related reaction may include one or more of the following: the skin getting hot or red (flushing), chills, fever, trouble breathing, low blood pressure, wheezing, tightening of the muscles in the chest around the airways, or a fast heartbeat.   Heart problems KADCYLA may cause heart problems, including those without symptoms (such as reduced heart function) and those with symptoms (such as congestive heart failure). Symptoms  may include swelling of the ankles or legs, shortness of breath, cough, rapid weight gain of greater than 5 lb in less than 24 hours, dizziness or loss of consciousness, or irregular heartbeat   Liver problems KADCYLA may cause severe liver problems that can be life-threatening. Symptoms of liver problems may include vomiting, nausea, eating disorder (anorexia), yellowing of the skin (jaundice), stomach pain, dark urine, or itching   Lung problems KADCYLA may cause lung problems, including inflammation of the lung tissue, which can be life-threatening. Signs of lung problems may include trouble breathing, cough, tiredness, and fluid in the lungs  Nerve damage Symptoms may include numbness and tingling, burning or sharp pain, sensitivity to touch, lack of coordination, muscle weakness, or loss of muscle function  Serious bleeding KADCYLA can cause life-threatening bleeding. Taking KALakesideith other medications used to thin your blood (antiplatelet) or prevent blood clots (anticoagulation) can increase your risk of bleeding. Your doctor should provide additional monitoring if you are taking one of these other drugs while on KASt. CharlesLife-threatening bleeding may also happen with KADCYLA, even when blood thinners are not also being taken            SYMPTOMS TO REPORT AS SOON AS POSSIBLE AFTER TREATMENT:  FEVER GREATER THAN 100.5 F  CHILLS WITH OR WITHOUT FEVER  NAUSEA AND VOMITING THAT IS NOT CONTROLLED WITH YOUR NAUSEA MEDICATION  UNUSUAL SHORTNESS OF BREATH  UNUSUAL BRUISING OR BLEEDING  TENDERNESS IN MOUTH AND THROAT WITH OR WITHOUT PRESENCE OF ULCERS  URINARY PROBLEMS  BOWEL PROBLEMS  UNUSUAL RASH    Wear comfortable clothing and clothing appropriate for easy  access to any Portacath or PICC line. Let us know if there is anything that we can do to make your therapy better!      I have been informed and understand all of the instructions given to me and have  received a copy. I have been instructed to call the clinic [phone] or my family physician as soon as possible for continued medical care, if indicated. I do not have any more questions at this time but understand that I may call the [facility] or the Patient Navigator at [phone] during office hours should I have questions or need assistance in obtaining follow-up care.

## 2016-10-01 ENCOUNTER — Encounter (HOSPITAL_COMMUNITY): Payer: Medicare Other

## 2016-10-01 ENCOUNTER — Encounter (HOSPITAL_COMMUNITY): Payer: Self-pay

## 2016-10-01 ENCOUNTER — Other Ambulatory Visit: Payer: Self-pay | Admitting: Radiology

## 2016-10-01 ENCOUNTER — Other Ambulatory Visit: Payer: Self-pay | Admitting: Student

## 2016-10-01 ENCOUNTER — Encounter (HOSPITAL_BASED_OUTPATIENT_CLINIC_OR_DEPARTMENT_OTHER): Payer: Medicare Other

## 2016-10-01 VITALS — BP 134/72 | HR 84 | Temp 98.3°F | Resp 18 | Wt 157.2 lb

## 2016-10-01 DIAGNOSIS — Z5112 Encounter for antineoplastic immunotherapy: Secondary | ICD-10-CM | POA: Diagnosis not present

## 2016-10-01 DIAGNOSIS — C50919 Malignant neoplasm of unspecified site of unspecified female breast: Secondary | ICD-10-CM

## 2016-10-01 DIAGNOSIS — C7800 Secondary malignant neoplasm of unspecified lung: Secondary | ICD-10-CM | POA: Diagnosis not present

## 2016-10-01 DIAGNOSIS — C7951 Secondary malignant neoplasm of bone: Secondary | ICD-10-CM

## 2016-10-01 LAB — CBC WITH DIFFERENTIAL/PLATELET
Basophils Absolute: 0 10*3/uL (ref 0.0–0.1)
Basophils Relative: 0 %
EOS ABS: 0.1 10*3/uL (ref 0.0–0.7)
Eosinophils Relative: 3 %
HEMATOCRIT: 36.4 % (ref 36.0–46.0)
Hemoglobin: 11.6 g/dL — ABNORMAL LOW (ref 12.0–15.0)
LYMPHS ABS: 0.8 10*3/uL (ref 0.7–4.0)
LYMPHS PCT: 21 %
MCH: 26.6 pg (ref 26.0–34.0)
MCHC: 31.9 g/dL (ref 30.0–36.0)
MCV: 83.5 fL (ref 78.0–100.0)
MONOS PCT: 11 %
Monocytes Absolute: 0.4 10*3/uL (ref 0.1–1.0)
NEUTROS PCT: 65 %
Neutro Abs: 2.5 10*3/uL (ref 1.7–7.7)
Platelets: 236 10*3/uL (ref 150–400)
RBC: 4.36 MIL/uL (ref 3.87–5.11)
RDW: 17.4 % — ABNORMAL HIGH (ref 11.5–15.5)
WBC: 3.8 10*3/uL — ABNORMAL LOW (ref 4.0–10.5)

## 2016-10-01 LAB — COMPREHENSIVE METABOLIC PANEL
ALK PHOS: 54 U/L (ref 38–126)
ALT: 13 U/L — AB (ref 14–54)
ANION GAP: 6 (ref 5–15)
AST: 20 U/L (ref 15–41)
Albumin: 3.7 g/dL (ref 3.5–5.0)
BILIRUBIN TOTAL: 0.6 mg/dL (ref 0.3–1.2)
BUN: 12 mg/dL (ref 6–20)
CALCIUM: 9.6 mg/dL (ref 8.9–10.3)
CO2: 31 mmol/L (ref 22–32)
CREATININE: 0.76 mg/dL (ref 0.44–1.00)
Chloride: 100 mmol/L — ABNORMAL LOW (ref 101–111)
Glucose, Bld: 140 mg/dL — ABNORMAL HIGH (ref 65–99)
Potassium: 3.8 mmol/L (ref 3.5–5.1)
SODIUM: 137 mmol/L (ref 135–145)
TOTAL PROTEIN: 7.3 g/dL (ref 6.5–8.1)

## 2016-10-01 MED ORDER — HEPARIN SOD (PORK) LOCK FLUSH 100 UNIT/ML IV SOLN
500.0000 [IU] | Freq: Once | INTRAVENOUS | Status: AC
  Administered 2016-10-01: 500 [IU] via INTRAVENOUS

## 2016-10-01 MED ORDER — SODIUM CHLORIDE 0.9 % IV SOLN
3.6000 mg/kg | Freq: Once | INTRAVENOUS | Status: AC
  Administered 2016-10-01: 260 mg via INTRAVENOUS
  Filled 2016-10-01: qty 5

## 2016-10-01 MED ORDER — SODIUM CHLORIDE 0.9 % IV SOLN
Freq: Once | INTRAVENOUS | Status: AC
  Administered 2016-10-01: 11:00:00 via INTRAVENOUS

## 2016-10-01 MED ORDER — DIPHENHYDRAMINE HCL 25 MG PO CAPS
50.0000 mg | ORAL_CAPSULE | Freq: Once | ORAL | Status: AC
  Administered 2016-10-01: 50 mg via ORAL
  Filled 2016-10-01: qty 2

## 2016-10-01 MED ORDER — ACETAMINOPHEN 325 MG PO TABS
650.0000 mg | ORAL_TABLET | Freq: Once | ORAL | Status: AC
  Administered 2016-10-01: 650 mg via ORAL
  Filled 2016-10-01: qty 2

## 2016-10-01 MED ORDER — DENOSUMAB 120 MG/1.7ML ~~LOC~~ SOLN
120.0000 mg | Freq: Once | SUBCUTANEOUS | Status: AC
  Administered 2016-10-01: 120 mg via SUBCUTANEOUS
  Filled 2016-10-01: qty 1.7

## 2016-10-01 NOTE — Patient Instructions (Signed)
[facility] Discharge Instructions for Patients Receiving Chemotherapy   Beginning January 23rd 2017 lab work for the [facility] will be done in the  Main lab at [facility] on 1st floor. If you have a lab appointment with the [place] please come in thru the  Main Entrance and check in at the main information desk   Today you received the following chemotherapy agents Kadcyla  To help prevent nausea and vomiting after your treatment, we encourage you to take your nausea medication     If you develop nausea and vomiting, or diarrhea that is not controlled by your medication, call the clinic.  The clinic phone number is [phone]. Office hours are Monday-Friday 8:30am-5:00pm.  BELOW ARE SYMPTOMS THAT SHOULD BE REPORTED IMMEDIATELY:  *FEVER GREATER THAN 101.0 F  *CHILLS WITH OR WITHOUT FEVER  NAUSEA AND VOMITING THAT IS NOT CONTROLLED WITH YOUR NAUSEA MEDICATION  *UNUSUAL SHORTNESS OF BREATH  *UNUSUAL BRUISING OR BLEEDING  TENDERNESS IN MOUTH AND THROAT WITH OR WITHOUT PRESENCE OF ULCERS  *URINARY PROBLEMS  *BOWEL PROBLEMS  UNUSUAL RASH Items with * indicate a potential emergency and should be followed up as soon as possible. If you have an emergency after office hours please contact your primary care physician or go to the nearest emergency department.  Please call the clinic during office hours if you have any questions or concerns.   You may also contact the Patient Navigator at [phone] should you have any questions or need assistance in obtaining follow up care.      Resources For Cancer Patients and their Caregivers ? American Cancer Society: Can assist with transportation, wigs, general needs, runs Look Good Feel Better.        573-139-9083 ? Cancer Care: Provides financial assistance, online support groups, medication/co-pay assistance.  1-800-813-HOPE 8504677705) ? [employer] Assists [place]  cancer patients and their families through emotional , educational and financial support.  551-792-3672 ? [place] DSS Where to apply for food stamps, Medicaid and utility assistance. (845)677-4418 ? [facility]: Transportation to medical appointments. (205) 399-8544 ? Social Security Administration: May apply for disability if have a Stage IV cancer. 309-729-2390 862-512-1537 ? [employer], Disability and Transit Services: Assists with nutrition, care and transit needs. 579-545-4682

## 2016-10-01 NOTE — Progress Notes (Signed)
Patient presented today for first cycle of Kadcyla. Consent obtained, teaching done. Patient verbalized understanding. 2D-Echo done, labs reviewed with MD.proceed with treatment.  Treatment given today per orders. Patient tolerated it well without problems. Vitals stable and discharged home from clinic ambulatory.

## 2016-10-02 ENCOUNTER — Ambulatory Visit (HOSPITAL_COMMUNITY)
Admission: RE | Admit: 2016-10-02 | Discharge: 2016-10-02 | Disposition: A | Discharge status: Home / Self Care | Payer: Medicare Other | Source: Ambulatory Visit | Attending: Oncology | Admitting: Oncology

## 2016-10-02 ENCOUNTER — Telehealth (HOSPITAL_COMMUNITY): Payer: Self-pay

## 2016-10-02 ENCOUNTER — Encounter (HOSPITAL_COMMUNITY): Payer: Self-pay

## 2016-10-02 DIAGNOSIS — Z9884 Bariatric surgery status: Secondary | ICD-10-CM | POA: Diagnosis not present

## 2016-10-02 DIAGNOSIS — M797 Fibromyalgia: Secondary | ICD-10-CM | POA: Insufficient documentation

## 2016-10-02 DIAGNOSIS — Z950 Presence of cardiac pacemaker: Secondary | ICD-10-CM | POA: Insufficient documentation

## 2016-10-02 DIAGNOSIS — Z888 Allergy status to other drugs, medicaments and biological substances status: Secondary | ICD-10-CM | POA: Diagnosis not present

## 2016-10-02 DIAGNOSIS — M545 Low back pain: Secondary | ICD-10-CM | POA: Diagnosis not present

## 2016-10-02 DIAGNOSIS — R1901 Right upper quadrant abdominal swelling, mass and lump: Secondary | ICD-10-CM | POA: Diagnosis not present

## 2016-10-02 DIAGNOSIS — F419 Anxiety disorder, unspecified: Secondary | ICD-10-CM | POA: Diagnosis not present

## 2016-10-02 DIAGNOSIS — Z803 Family history of malignant neoplasm of breast: Secondary | ICD-10-CM | POA: Insufficient documentation

## 2016-10-02 DIAGNOSIS — R001 Bradycardia, unspecified: Secondary | ICD-10-CM | POA: Insufficient documentation

## 2016-10-02 DIAGNOSIS — N852 Hypertrophy of uterus: Secondary | ICD-10-CM | POA: Diagnosis not present

## 2016-10-02 DIAGNOSIS — F319 Bipolar disorder, unspecified: Secondary | ICD-10-CM | POA: Insufficient documentation

## 2016-10-02 DIAGNOSIS — C7951 Secondary malignant neoplasm of bone: Secondary | ICD-10-CM | POA: Diagnosis not present

## 2016-10-02 DIAGNOSIS — G4730 Sleep apnea, unspecified: Secondary | ICD-10-CM | POA: Insufficient documentation

## 2016-10-02 DIAGNOSIS — Z9013 Acquired absence of bilateral breasts and nipples: Secondary | ICD-10-CM | POA: Diagnosis not present

## 2016-10-02 DIAGNOSIS — Z9889 Other specified postprocedural states: Secondary | ICD-10-CM | POA: Insufficient documentation

## 2016-10-02 DIAGNOSIS — Z7982 Long term (current) use of aspirin: Secondary | ICD-10-CM | POA: Insufficient documentation

## 2016-10-02 DIAGNOSIS — I10 Essential (primary) hypertension: Secondary | ICD-10-CM | POA: Insufficient documentation

## 2016-10-02 DIAGNOSIS — Z79899 Other long term (current) drug therapy: Secondary | ICD-10-CM | POA: Insufficient documentation

## 2016-10-02 DIAGNOSIS — Z882 Allergy status to sulfonamides status: Secondary | ICD-10-CM | POA: Insufficient documentation

## 2016-10-02 DIAGNOSIS — K219 Gastro-esophageal reflux disease without esophagitis: Secondary | ICD-10-CM | POA: Insufficient documentation

## 2016-10-02 DIAGNOSIS — Z853 Personal history of malignant neoplasm of breast: Secondary | ICD-10-CM | POA: Insufficient documentation

## 2016-10-02 DIAGNOSIS — Z800 Family history of malignant neoplasm of digestive organs: Secondary | ICD-10-CM | POA: Insufficient documentation

## 2016-10-02 DIAGNOSIS — Z801 Family history of malignant neoplasm of trachea, bronchus and lung: Secondary | ICD-10-CM | POA: Insufficient documentation

## 2016-10-02 DIAGNOSIS — Z9049 Acquired absence of other specified parts of digestive tract: Secondary | ICD-10-CM | POA: Insufficient documentation

## 2016-10-02 DIAGNOSIS — M1990 Unspecified osteoarthritis, unspecified site: Secondary | ICD-10-CM | POA: Insufficient documentation

## 2016-10-02 DIAGNOSIS — Z6841 Body Mass Index (BMI) 40.0 and over, adult: Secondary | ICD-10-CM | POA: Diagnosis not present

## 2016-10-02 DIAGNOSIS — Z87891 Personal history of nicotine dependence: Secondary | ICD-10-CM | POA: Insufficient documentation

## 2016-10-02 DIAGNOSIS — Z8249 Family history of ischemic heart disease and other diseases of the circulatory system: Secondary | ICD-10-CM | POA: Insufficient documentation

## 2016-10-02 DIAGNOSIS — Z880 Allergy status to penicillin: Secondary | ICD-10-CM | POA: Insufficient documentation

## 2016-10-02 DIAGNOSIS — R51 Headache: Secondary | ICD-10-CM | POA: Diagnosis not present

## 2016-10-02 LAB — CBC
HEMATOCRIT: 34.2 % — AB (ref 36.0–46.0)
HEMOGLOBIN: 11.1 g/dL — AB (ref 12.0–15.0)
MCH: 26.3 pg (ref 26.0–34.0)
MCHC: 32.5 g/dL (ref 30.0–36.0)
MCV: 81 fL (ref 78.0–100.0)
Platelets: 202 10*3/uL (ref 150–400)
RBC: 4.22 MIL/uL (ref 3.87–5.11)
RDW: 17.1 % — AB (ref 11.5–15.5)
WBC: 5.1 10*3/uL (ref 4.0–10.5)

## 2016-10-02 LAB — PROTIME-INR
INR: 1.08
Prothrombin Time: 14 seconds (ref 11.4–15.2)

## 2016-10-02 LAB — APTT: aPTT: 25 seconds (ref 24–36)

## 2016-10-02 MED ORDER — LIDOCAINE HCL 1 % IJ SOLN
10.0000 mL | Freq: Once | INTRAMUSCULAR | Status: AC
  Administered 2016-10-02: 10 mL

## 2016-10-02 MED ORDER — MIDAZOLAM HCL 2 MG/2ML IJ SOLN
INTRAMUSCULAR | Status: AC | PRN
  Administered 2016-10-02 (×3): 1 mg via INTRAVENOUS

## 2016-10-02 MED ORDER — NALOXONE HCL 0.4 MG/ML IJ SOLN
INTRAMUSCULAR | Status: AC
  Filled 2016-10-02: qty 1

## 2016-10-02 MED ORDER — SODIUM CHLORIDE 0.9 % IV SOLN
INTRAVENOUS | Status: DC
Start: 2016-10-02 — End: 2016-10-03
  Administered 2016-10-02: 10:00:00 via INTRAVENOUS

## 2016-10-02 MED ORDER — HYDROCODONE-ACETAMINOPHEN 5-325 MG PO TABS
1.0000 | ORAL_TABLET | ORAL | Status: DC | PRN
  Administered 2016-10-02: 1 via ORAL
  Filled 2016-10-02: qty 1

## 2016-10-02 MED ORDER — FLUMAZENIL 0.5 MG/5ML IV SOLN
INTRAVENOUS | Status: AC
  Filled 2016-10-02: qty 5

## 2016-10-02 MED ORDER — FENTANYL CITRATE (PF) 100 MCG/2ML IJ SOLN
INTRAMUSCULAR | Status: AC
  Filled 2016-10-02: qty 6

## 2016-10-02 MED ORDER — FENTANYL CITRATE (PF) 100 MCG/2ML IJ SOLN
INTRAMUSCULAR | Status: AC | PRN
  Administered 2016-10-02 (×2): 25 ug via INTRAVENOUS
  Administered 2016-10-02: 50 ug via INTRAVENOUS

## 2016-10-02 MED ORDER — LIDOCAINE HCL 1 % IJ SOLN
INTRAMUSCULAR | Status: AC | PRN
  Administered 2016-10-02: 10 mL

## 2016-10-02 MED ORDER — MIDAZOLAM HCL 2 MG/2ML IJ SOLN
INTRAMUSCULAR | Status: AC
  Filled 2016-10-02: qty 6

## 2016-10-02 MED ORDER — HEPARIN SOD (PORK) LOCK FLUSH 100 UNIT/ML IV SOLN
500.0000 [IU] | INTRAVENOUS | Status: AC | PRN
  Administered 2016-10-02: 500 [IU]
  Filled 2016-10-02: qty 5

## 2016-10-02 NOTE — Sedation Documentation (Signed)
Pt in pain and needing medication to get pre-procedure images.  Verbal order from Dr. Anselm Pancoast to give pt 60mcg of Fentanyl.

## 2016-10-02 NOTE — Sedation Documentation (Signed)
Pt whimpering in pain.  Medications given.

## 2016-10-02 NOTE — Progress Notes (Signed)
Patient here for biopsy at [facility]. Called to speak to friend who dropped her off Ashlee Moore [phone]) to confirm she was coming back to pick her up post procedure. Message left for her to call back to the nurses station to verify ride for post procedure.

## 2016-10-02 NOTE — Telephone Encounter (Signed)
24 hour follow up -left message for patient to call us to let us know how she was doing since chemotherapy yesterday.

## 2016-10-02 NOTE — Discharge Instructions (Signed)
Moderate Conscious Sedation, Adult, Care After These instructions provide you with information about caring for yourself after your procedure. Your health care provider may also give you more specific instructions. Your treatment has been planned according to current medical practices, but problems sometimes occur. Call your health care provider if you have any problems or questions after your procedure. What can I expect after the procedure? After your procedure, it is common:  To feel sleepy for several hours.  To feel clumsy and have poor balance for several hours.  To have poor judgment for several hours.  To vomit if you eat too soon.  Follow these instructions at home: For at least 24 hours after the procedure:   Do not: ? Participate in activities where you could fall or become injured. ? Drive. ? Use heavy machinery. ? Drink alcohol. ? Take sleeping pills or medicines that cause drowsiness. ? Make important decisions or sign legal documents. ? Take care of children on your own.  Rest. Eating and drinking  Follow the diet recommended by your health care provider.  If you vomit: ? Drink water, juice, or soup when you can drink without vomiting. ? Make sure you have little or no nausea before eating solid foods. General instructions  Have a responsible adult stay with you until you are awake and alert.  Take over-the-counter and prescription medicines only as told by your health care provider.  If you smoke, do not smoke without supervision.  Keep all follow-up visits as told by your health care provider. This is important. Contact a health care provider if:  You keep feeling nauseous or you keep vomiting.  You feel light-headed.  You develop a rash.  You have a fever. Get help right away if:  You have trouble breathing. This information is not intended to replace advice given to you by your health care provider. Make sure you discuss any questions you have  with your health care provider. Document Released: 11/17/2012 Document Revised: 07/02/2015 Document Reviewed: 05/19/2015 Elsevier Interactive Patient Education  2018 [employer].   Needle Biopsy, Care After These instructions give you information about caring for yourself after your procedure. Your doctor may also give you more specific instructions. Call your doctor if you have any problems or questions after your procedure. Follow these instructions at home:  Rest as told by your doctor.  Take medicines only as told by your doctor.  There are many different ways to close and cover the biopsy site, including stitches (sutures), skin glue, and adhesive strips. Follow instructions from your doctor about: ? How to take care of your biopsy site. ? When and how you should change your bandage (dressing). ? When you should remove your dressing. ? Removing whatever was used to close your biopsy site.  Check your biopsy site every day for signs of infection. Watch for: ? Redness, swelling, or pain. ? Fluid, blood, or pus. Contact a doctor if:  You have a fever.  You have redness, swelling, or pain at the biopsy site, and it lasts longer than a few days.  You have fluid, blood, or pus coming from the biopsy site.  You feel sick to your stomach (nauseous).  You throw up (vomit). Get help right away if:  You are short of breath.  You have trouble breathing.  Your chest hurts.  You feel dizzy or you pass out (faint).  You have bleeding that does not stop with pressure or a bandage.  You cough up blood.  Your belly (abdomen) hurts. This information is not intended to replace advice given to you by your health care provider. Make sure you discuss any questions you have with your health care provider. Document Released: 01/10/2008 Document Revised: 07/05/2015 Document Reviewed: 01/23/2014 Elsevier Interactive Patient Education  [employer].

## 2016-10-02 NOTE — Procedures (Signed)
CT guided core biopsies of the right abdominal wall fatty mass.  5 cores obtained.  Minimal blood loss and no immediate complication.

## 2016-10-02 NOTE — H&P (Signed)
Chief Complaint: Patient was seen in consultation today for right upper quadrant abdominal mass  Referring Physician(s): Zhou,Louise  Supervising Physician: Markus Daft  Patient Status: [facility] - Out-pt  History of Present Illness: Ashlee Moore is a 56 y.o. 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 Lucy Antigua at [facility], lumbar DDD, fibromyalgia, osteoarthritis, chronic pain, urinary incontinence, chronic headaches, allergic rhinitis, S/P gastric bypass for obesity, enlarged uterus with multiple leiomyomas, and metastatic breast cancer who is followed by [facility].   Patient underwent recent CT Abdomen 09/19/16 which showed: 1. Fatty, minimally complex soft tissue mass in the right posterolateral abdominal wall musculature centered in the external oblique muscle measures 5.4 x 5.1 cm. While this lesion demonstrates very little interval change compared to prior imaging from earlier this year, there has been definite enlargement compared to more remote prior studies from 2014. While this may simply represent a lipoma, the mild internal complexity raises the possibility of a low grade liposarcoma. Additionally, this may correlate with the site of the patient's reported right/lower back pain. This region would be amenable to percutaneous biopsy if deemed clinically warranted. 2. Stable osseous metastatic disease with evidence of prior cement augmentation in the left sacral ala and L5 vertebral body. 3. Stable intra and extrahepatic biliary ductal dilatation. 4. Additional ancillary findings as above without significant interval change.  IR consulted for right upper quadrant abdominal mass biopsy. Case reviewed by Dr. Vernard Gambles who approves patient for procedure.  She presents today in her usual state of health.  She does have pain today due to holding her pain medications at  home.  She has been NPO.  She does not take blood thinners.   Past Medical History:  Diagnosis Date  . Anemia   . Arthritis   . AV block, Mobitz 2 11/14/09   s/p MDT Revo PPM by JA  . Bipolar disorder ([employer])   . Breast cancer metastasized to bone ([place]) 08/16/2012  . Cancer ([place])    right breast/LAST CHEOM 10/07/11  . Chills   . Chronic pain 12/13/2015  . Easy bruising   . Elevated LFTs    hx  . Fever   . Fibromyalgia   . Generalized headaches   . GERD (gastroesophageal reflux disease)   . Hypertension   . Morbid obesity with body mass index of 40.0-44.9 in adult ([place]) 10/22/2011   BMI 44.26    . Osteoporosis   . Pacemaker   . Sleep apnea    STOP BANG SCORE 4  . Weakness     Past Surgical History:  Procedure Laterality Date  . BILATERAL KNEE ARTHROSCOPY    . BREAST SURGERY  2012,12   lumpectomy,mastectomy bil  . CHOLECYSTECTOMY    . ECTOPIC PREGNANCY SURGERY     x2  . gastric bypass surgery    . left ovary and fallopian tube removed due to chronic pain    . mastectomy  2013   bilateral  . MULTIPLE EXTRACTIONS WITH ALVEOLOPLASTY N/A 06/04/2012   Procedure: MULTIPLE EXTRACION WITH ALVEOLOPLASTY EXTRACT: 4, 5, 8, 9, 10, 13, 14 ,28;  Surgeon: Gae Bon, DDS;  Location: [facility];  Service: Oral Surgery;  Laterality: N/A;  . PACEMAKER INSERTION  2011  . plastic surgery on face     for ptosis of rt. eyelid  . PORTACATH PLACEMENT     power port - right  . PPM  11/14/09   MDT Revo implanted by Dr Rayann Heman for syncope/ Mobitz II AV block    Allergies: Penicillins; Sulfonamide derivatives; and Tape  Medications: Prior to Admission medications   Medication Sig Start Date End Date Taking? Authorizing Provider  clonazePAM (KLONOPIN) 1 MG tablet Take 1 mg by mouth 2 (two) times daily as needed for anxiety.    Yes [provider]  clotrimazole-betamethasone (LOTRISONE) cream Apply 1 application topically 2 (two) times daily as needed (itching).    Yes [provider]  cyclobenzaprine (FLEXERIL) 10 MG tablet Take 10 mg by mouth at bedtime. Muscle spasm   Yes [provider]  diclofenac sodium (VOLTAREN) 1 % GEL Place 2 g onto the skin 3 (three) times daily as needed (arthritis).    Yes [provider]  DULoxetine (CYMBALTA) 60 MG capsule Take 60 mg by mouth every evening.    Yes [provider]  hydroxypropyl methylcellulose (ISOPTO TEARS) 2.5 % ophthalmic solution Place 1 drop into both eyes 4 (four) times daily as needed (dry eyes).   Yes [provider]  Multiple Vitamin (THERA) TABS Take 1 tablet by mouth at bedtime.  05/27/13  Yes [provider]  omeprazole (PRILOSEC) 20 MG capsule Take 20 mg by mouth at bedtime.    Yes [provider]  Oxycodone HCl 20 MG TABS Take 40 mg by mouth every 3 (three) hours.    Yes [provider]  potassium chloride SA (K-DUR,KLOR-CON) 20 MEQ tablet Take 20 mEq by mouth daily.  08/10/12  Yes [provider]  pregabalin (LYRICA) 100 MG capsule Take 100 mg by mouth 2 (two) times daily.   Yes [provider]  promethazine (PHENERGAN) 25 MG tablet Take 25 mg by mouth every 8 (eight) hours as needed for nausea.    Yes [provider]  spironolactone (ALDACTONE) 25 MG tablet Take 25 mg by mouth 2 (two) times daily.   Yes [provider]  tamoxifen (NOLVADEX) 10 MG tablet Take by mouth. 07/26/15  Yes [provider]  triamterene-hydrochlorothiazide (MAXZIDE-25) 37.5-25 MG tablet Take 1 tablet by mouth daily.   Yes [provider]  zolpidem (AMBIEN) 10 MG tablet Take 10-15 mg by mouth at bedtime.    Yes [provider]  aspirin 81 MG EC tablet Take 81 mg by mouth daily.     [provider]  Calcium Carbonate-Vitamin D 600-400 MG-UNIT per tablet Take 1 tablet by mouth daily.  05/27/13   [provider]  fentaNYL (DURAGESIC - DOSED MCG/HR) 100 MCG/HR Place 400 mcg onto the skin every 3  (three) days.     [provider]  furosemide (LASIX) 40 MG tablet Take 40 mg by mouth.    [provider]  lidocaine-prilocaine (EMLA) cream Apply 1 application topically as needed (prior to chemo). For port (2.5%/2/5%)    [provider]  triamcinolone (KENALOG) 0.1 % paste Place 1 application onto teeth 2 (two) times daily as needed (affected area on gums or mouth.). Apply to gums or mouth.    [provider]     Family History  Problem Relation Age of Onset  . Heart attack Father   . Breast cancer Sister   . Cancer Sister        brain  . Cancer Brother        lung  . Cancer Other        colon  . Brain cancer Sister     Social History  Social History  . Marital status: Single    Spouse name: N/A  . Number of children: N/A  . Years of education: N/A   Social History Main Topics  . Smoking status: Former Smoker    Packs/day: 0.30    Years: 8.00    Types: Cigarettes    Quit date: 02/10/1985  . Smokeless tobacco: Never Used     Comment: Quit 25 years back.   . Alcohol use No  . Drug use: No  . Sexual activity: Not Asked   Other Topics Concern  . None   Social History Narrative   Lives with her children. Disabled.     Review of Systems  Constitutional: Negative for fatigue and fever.  Respiratory: Negative for cough and shortness of breath.   Cardiovascular: Negative for chest pain.  Gastrointestinal: Positive for abdominal pain.  Musculoskeletal: Positive for back pain.  Psychiatric/Behavioral: Negative for behavioral problems and confusion.    Vital Signs: BP 118/83 (BP Location: Left Wrist)   Pulse 83   Temp 99.1 F (37.3 C) (Oral)   Resp 16   SpO2 97%   Physical Exam  Constitutional: She is oriented to person, place, and time. She appears well-developed.  Cardiovascular: Normal rate, regular rhythm and normal heart sounds.   Pulmonary/Chest: Effort normal and breath sounds normal. No respiratory distress.    Abdominal: Soft. She exhibits mass (right upper quandrant extending into flank). There is tenderness.  Neurological: She is alert and oriented to person, place, and time.  Skin: Skin is warm and dry.  Psychiatric: She has a normal mood and affect. Her behavior is normal. Judgment and thought content normal.  Nursing note and vitals reviewed.   Mallampati Score:  MD Evaluation Airway: WNL Heart: WNL Abdomen: WNL Chest/ Lungs: WNL ASA  Classification: 3 Mallampati/Airway Score: Two  Imaging: Ct Chest W Contrast  Result Date: 09/19/2016 CLINICAL DATA:  56 year old female with lumbar spine and right flank pain for the past 2 weeks. EXAM: CT CHEST, ABDOMEN, AND PELVIS WITH CONTRAST TECHNIQUE: Multidetector CT imaging of the chest, abdomen and pelvis was performed following the standard protocol during bolus administration of intravenous contrast. CONTRAST:  157mL ISOVUE-300 IOPAMIDOL (ISOVUE-300) INJECTION 61% COMPARISON:  CT scan of the abdomen and pelvis performed yesterday 09/18/2016 at [facility]; CT scan of the abdomen and pelvis performed 06/24/2016 and PET-CT 04/09/2016 ; CT scan of the neck 06/07/2013 FINDINGS: CT CHEST FINDINGS Cardiovascular: Conventional 3 vessel arch anatomy. No evidence of aortic aneurysm or dissection. The heart is normal in size. No pericardial effusion. Left subclavian approach cardiac rhythm maintenance device with leads terminating in the right atrium and right ventricular apex. There is a right subclavian port catheter with the tip terminating at the cavoatrial junction. The main pulmonary artery is normal in size. No central pulmonary embolus. Mediastinum/Nodes: 2.3 cm mixed cystic and solid nodule within the right thyroid gland remains closely unchanged dating back to April of 2015. No suspicious mediastinal mass or adenopathy. Lungs/Pleura: Nonspecific 4 mm ground-glass attenuation nodular opacity in the medial aspect of the right lower lobe (image  111 series 7) in has not been seen on previous imaging. Irregular nodular opacity in the posterior aspect of the right lower lobe measures approximately 7 mm (image 89 of series 7). This was previously seen on a prior PET-CT but only measured 5 mm at that time. It appears slightly more prominent on today's examination. 9 mm nodular opacity in the left lower lobe (image 98 series 7).  This lesion has also enlarged previously measuring 7 mm on the prior PET-CT. Musculoskeletal: Healing pathologic fracture of the sternum. Multiple remote left-sided rib fractures including left ribs 3, 5, 6, 7. No definite new acute osseous lesion. Prior cement augmentation of a presumed pathologic T10 compression fracture. CT ABDOMEN PELVIS FINDINGS Hepatobiliary: Stable intra and extrahepatic biliary ductal dilatation. The common bile duct measures up to 12 mm at the pancreatic head. There is mild dilatation of the pancreatic duct. No definite distal obstructing mass or stone. No discrete hepatic lesion is identified. The gallbladder is surgically absent. Pancreas: Unremarkable. No pancreatic ductal dilatation or surrounding inflammatory changes. Spleen: Normal in size without focal abnormality. Adrenals/Urinary Tract: Adrenal glands are unremarkable. Kidneys are normal, without renal calculi, focal lesion, or hydronephrosis. Bladder is unremarkable. Stomach/Bowel: Surgical changes of prior gastric bypass surgery. No evidence of bowel obstruction. Normal appendix in the right lower quadrant. Vascular/Lymphatic: No significant atherosclerotic plaque. There is a white filling defect within the distal inferior vena cava which appears to extend through the caval bifurcation and into the retroperitoneal fat. This is been present dating back to at least 2014. No evidence of migration, hemorrhage or other complication. This likely represents bone cement. No suspicious lymphadenopathy. Reproductive: Heterogeneous uterus, similar compared to  prior. No adnexal mass. Other: Predominantly fatty soft tissue mass in the right posterolateral abdominal wall musculature measures 5.4 x 5.1 cm. This appears to be centered within the external oblique musculature. This lesion has not appreciably increased in size compared to February of 2018 but has definitively enlarged compared to more remote prior studies from 2014. Minimal complexity with some thin internal septations. No abdominal wall hernia or abnormality. No abdominopelvic ascites. Musculoskeletal: Prior left sacro plasty, L5 cement augmentation of a prior pathologic fracture. Advanced multilevel degenerative facet arthropathy. Heterogeneous and moth-eaten appearance of the left iliac wing consistent with metastatic involvement. No significant interval progression compared to prior imaging. IMPRESSION: CT CHEST 1. Enlarging bilateral pulmonary nodules concerning for progressive metastatic disease. 2. Stable osseous metastatic disease without significant interval change. 3. Healing pathologic fractures.  No new fracture identified. 4. Additional ancillary findings as above without significant interval change. CT ABD/PELVIS 1. Fatty, minimally complex soft tissue mass in the right posterolateral abdominal wall musculature centered in the external oblique muscle measures 5.4 x 5.1 cm. While this lesion demonstrates very little interval change compared to prior imaging from earlier this year, there has been definite enlargement compared to more remote prior studies from 2014. While this may simply represent a lipoma, the mild internal complexity raises the possibility of a low grade liposarcoma. Additionally, this may correlate with the site of the patient's reported right/lower back pain. This region would be amenable to percutaneous biopsy if deemed clinically warranted. 2. Stable osseous metastatic disease with evidence of prior cement augmentation in the left sacral ala and L5 vertebral body. 3. Stable  intra and extrahepatic biliary ductal dilatation. 4. Additional ancillary findings as above without significant interval change. Electronically Signed   By: Jacqulynn Cadet M.D.   On: 09/19/2016 10:50   Ct Abdomen Pelvis W Contrast  Result Date: 09/19/2016 CLINICAL DATA:  56 year old female with lumbar spine and right flank pain for the past 2 weeks. EXAM: CT CHEST, ABDOMEN, AND PELVIS WITH CONTRAST TECHNIQUE: Multidetector CT imaging of the chest, abdomen and pelvis was performed following the standard protocol during bolus administration of intravenous contrast. CONTRAST:  17mL ISOVUE-300 IOPAMIDOL (ISOVUE-300) INJECTION 61% COMPARISON:  CT scan of the abdomen and pelvis  performed yesterday 09/18/2016 at [facility]; CT scan of the abdomen and pelvis performed 06/24/2016 and PET-CT 04/09/2016 ; CT scan of the neck 06/07/2013 FINDINGS: CT CHEST FINDINGS Cardiovascular: Conventional 3 vessel arch anatomy. No evidence of aortic aneurysm or dissection. The heart is normal in size. No pericardial effusion. Left subclavian approach cardiac rhythm maintenance device with leads terminating in the right atrium and right ventricular apex. There is a right subclavian port catheter with the tip terminating at the cavoatrial junction. The main pulmonary artery is normal in size. No central pulmonary embolus. Mediastinum/Nodes: 2.3 cm mixed cystic and solid nodule within the right thyroid gland remains closely unchanged dating back to April of 2015. No suspicious mediastinal mass or adenopathy. Lungs/Pleura: Nonspecific 4 mm ground-glass attenuation nodular opacity in the medial aspect of the right lower lobe (image 111 series 7) in has not been seen on previous imaging. Irregular nodular opacity in the posterior aspect of the right lower lobe measures approximately 7 mm (image 89 of series 7). This was previously seen on a prior PET-CT but only measured 5 mm at that time. It appears slightly more prominent  on today's examination. 9 mm nodular opacity in the left lower lobe (image 98 series 7). This lesion has also enlarged previously measuring 7 mm on the prior PET-CT. Musculoskeletal: Healing pathologic fracture of the sternum. Multiple remote left-sided rib fractures including left ribs 3, 5, 6, 7. No definite new acute osseous lesion. Prior cement augmentation of a presumed pathologic T10 compression fracture. CT ABDOMEN PELVIS FINDINGS Hepatobiliary: Stable intra and extrahepatic biliary ductal dilatation. The common bile duct measures up to 12 mm at the pancreatic head. There is mild dilatation of the pancreatic duct. No definite distal obstructing mass or stone. No discrete hepatic lesion is identified. The gallbladder is surgically absent. Pancreas: Unremarkable. No pancreatic ductal dilatation or surrounding inflammatory changes. Spleen: Normal in size without focal abnormality. Adrenals/Urinary Tract: Adrenal glands are unremarkable. Kidneys are normal, without renal calculi, focal lesion, or hydronephrosis. Bladder is unremarkable. Stomach/Bowel: Surgical changes of prior gastric bypass surgery. No evidence of bowel obstruction. Normal appendix in the right lower quadrant. Vascular/Lymphatic: No significant atherosclerotic plaque. There is a white filling defect within the distal inferior vena cava which appears to extend through the caval bifurcation and into the retroperitoneal fat. This is been present dating back to at least 2014. No evidence of migration, hemorrhage or other complication. This likely represents bone cement. No suspicious lymphadenopathy. Reproductive: Heterogeneous uterus, similar compared to prior. No adnexal mass. Other: Predominantly fatty soft tissue mass in the right posterolateral abdominal wall musculature measures 5.4 x 5.1 cm. This appears to be centered within the external oblique musculature. This lesion has not appreciably increased in size compared to February of 2018 but  has definitively enlarged compared to more remote prior studies from 2014. Minimal complexity with some thin internal septations. No abdominal wall hernia or abnormality. No abdominopelvic ascites. Musculoskeletal: Prior left sacro plasty, L5 cement augmentation of a prior pathologic fracture. Advanced multilevel degenerative facet arthropathy. Heterogeneous and moth-eaten appearance of the left iliac wing consistent with metastatic involvement. No significant interval progression compared to prior imaging. IMPRESSION: CT CHEST 1. Enlarging bilateral pulmonary nodules concerning for progressive metastatic disease. 2. Stable osseous metastatic disease without significant interval change. 3. Healing pathologic fractures.  No new fracture identified. 4. Additional ancillary findings as above without significant interval change. CT ABD/PELVIS 1. Fatty, minimally complex soft tissue mass in the right posterolateral abdominal wall musculature  centered in the external oblique muscle measures 5.4 x 5.1 cm. While this lesion demonstrates very little interval change compared to prior imaging from earlier this year, there has been definite enlargement compared to more remote prior studies from 2014. While this may simply represent a lipoma, the mild internal complexity raises the possibility of a low grade liposarcoma. Additionally, this may correlate with the site of the patient's reported right/lower back pain. This region would be amenable to percutaneous biopsy if deemed clinically warranted. 2. Stable osseous metastatic disease with evidence of prior cement augmentation in the left sacral ala and L5 vertebral body. 3. Stable intra and extrahepatic biliary ductal dilatation. 4. Additional ancillary findings as above without significant interval change. Electronically Signed   By: Jacqulynn Cadet M.D.   On: 09/19/2016 10:50    Labs:  CBC:  Recent Labs  09/18/16 1055 10/01/16 0941 10/02/16 1002  WBC 4.8 3.8*  5.1  HGB 11.6* 11.6* 11.1*  HCT 37.5 36.4 34.2*  PLT 238 236 202    COAGS: No results for input(s): INR, APTT in the last 8760 hours.  BMP:  Recent Labs  09/18/16 1055 10/01/16 0941  NA 139 137  K 3.4* 3.8  CL 101 100*  CO2 31 31  GLUCOSE 105* 140*  BUN 12 12  CALCIUM 9.1 9.6  CREATININE 0.74 0.76  GFRNONAA >60 >60  GFRAA >60 >60    LIVER FUNCTION TESTS:  Recent Labs  09/18/16 1055 10/01/16 0941  BILITOT 0.5 0.6  AST 19 20  ALT 14 13*  ALKPHOS 62 54  PROT 7.1 7.3  ALBUMIN 3.6 3.7    TUMOR MARKERS: No results for input(s): AFPTM, CEA, CA199, CHROMGRNA in the last 8760 hours.  Assessment and Plan: Patient with past medical history of pacemaker (placed 11/2009) due to bradycardia, depression, anxiety, bipolar disorder, fibromyalgia, s/p gastric bypass, and metastatic breast cancer presents with complaint of right upper quadrant mass and associated pain.  IR consulted for right upper quadrant abdominal mass biopsy at the request of Dr. Talbert Cage. Case reviewed by Dr. Vernard Gambles who approves patient for procedure. Also discussed with Dr. Anselm Pancoast. Patient presents today in their usual state of health.  She has been NPO and is not currently on blood thinners.  Risks and benefits discussed with the patient including, but not limited to bleeding, infection, damage to adjacent structures or low yield requiring additional tests. All of the patient's questions were answered, patient is agreeable to proceed. Consent signed and in chart.   Thank you for this interesting consult.  I greatly enjoyed meeting Ashlee Moore and look forward to participating in their care.  A copy of this report was sent to the requesting provider on this date.  Electronically Signed: Docia Barrier, PA 10/02/2016, 10:31 AM   I spent a total of  30 Minutes   in face to face in clinical consultation, greater than 50% of which was counseling/coordinating care for abdominal mass

## 2016-10-06 ENCOUNTER — Ambulatory Visit: Payer: Medicare Other | Admitting: Family Medicine

## 2016-10-06 ENCOUNTER — Telehealth: Payer: Self-pay

## 2016-10-06 NOTE — Telephone Encounter (Signed)
I called pt and lmom to call our office and rs todays 2:20 appt,

## 2016-10-08 ENCOUNTER — Ambulatory Visit: Payer: Medicare Other | Admitting: Family Medicine

## 2016-10-09 ENCOUNTER — Ambulatory Visit (INDEPENDENT_AMBULATORY_CARE_PROVIDER_SITE_OTHER): Payer: Medicare Other | Admitting: Family Medicine

## 2016-10-09 ENCOUNTER — Encounter: Payer: Self-pay | Admitting: Family Medicine

## 2016-10-09 VITALS — BP 106/64 | HR 84 | Temp 98.5°F | Resp 16 | Ht 63.5 in | Wt 153.8 lb

## 2016-10-09 DIAGNOSIS — G893 Neoplasm related pain (acute) (chronic): Secondary | ICD-10-CM | POA: Insufficient documentation

## 2016-10-09 DIAGNOSIS — Z23 Encounter for immunization: Secondary | ICD-10-CM | POA: Diagnosis not present

## 2016-10-09 NOTE — Progress Notes (Signed)
Patient ID: Ashlee Moore, female    DOB: 09-Jan-1961, 56 y.o.   MRN: [id number]  Chief Complaint  Patient presents with  . Breast Cancer  . Pain  . Medication Refill    Allergies Penicillins; Sulfonamide derivatives; and Tape  Subjective:   Ashlee Moore is a 56 y.o. female who presents to [facility] today.  HPI Patient is here to establish care and she is requesting refill on fentanyl, clonazepam, oxycodone, and ambien. She would also like a tetanus shot. Patient reports that she needs her medications b/c she is about out of them. She reports that she has not seen her PCP for medications in over a year, but has been seeing another provider, Dr. Audree Camel who last gave her pain medications in July. She reports that she needs the medications for her chronic pain associated with breast cancer. She reports that she has not seen Dr. Scotty Court in over a year. She has been seeing Dr. Talbert Cage for her chemotherapy and cancer treatment. Patient reports that she really needs her pain medications. She said that she has never been seen by pain management and has not heard good things about pain clinics. She reports that she was told that she was palliative at her last visit with Dr. Talbert Cage but is not sure what that means and would like an explanation. She would like a refill on [place]. Reports that she believes that she would sleep better if she had a sleep medicine. Reports that her pain is pretty well controlled with the medications. Patient would like a tetanus shot today. Reports that it has been a long time.   Medication Refill  This is a chronic problem. The current episode started more than 1 year ago. Episode frequency: reports that she needs these medications daily to function. The problem has been unchanged (reports that is using less pain medication now than she used to use in the past. ). Associated symptoms include arthralgias. Pertinent negatives include no coughing,  diaphoresis, fever, neck pain or numbness. The symptoms are aggravated by exertion (pain is bad all the time. ). She has tried oral narcotics for the symptoms. The treatment provided moderate relief.    Past Medical History:  Diagnosis Date  . Anemia   . Arthritis   . AV block, Mobitz 2 11/14/09   s/p MDT Revo PPM by JA  . Bipolar disorder ([place])   . Breast cancer metastasized to bone ([place]) 08/16/2012  . Cancer ([place])    right breast/LAST CHEOM 10/07/11  . Chills   . Chronic pain 12/13/2015  . Easy bruising   . Elevated LFTs    hx  . Fever   . Fibromyalgia   . Generalized headaches   . GERD (gastroesophageal reflux disease)   . Hypertension   . Morbid obesity with body mass index of 40.0-44.9 in adult ([place]) 10/22/2011   BMI 44.26    . Osteoporosis   . Pacemaker   . Sleep apnea    STOP BANG SCORE 4  . Weakness     Past Surgical History:  Procedure Laterality Date  . BILATERAL KNEE ARTHROSCOPY    . BREAST SURGERY  2012,12   lumpectomy,mastectomy bil  . CHOLECYSTECTOMY    . ECTOPIC PREGNANCY SURGERY     x2  . gastric bypass surgery    . left ovary and fallopian tube removed due to chronic pain    . mastectomy  2013   bilateral  . MULTIPLE EXTRACTIONS WITH  ALVEOLOPLASTY N/A 06/04/2012   Procedure: MULTIPLE EXTRACION WITH ALVEOLOPLASTY EXTRACT: 4, 5, 8, 9, 10, 13, 14 ,28;  Surgeon: Gae Bon, DDS;  Location: [facility];  Service: Oral Surgery;  Laterality: N/A;  . PACEMAKER INSERTION  2011  . plastic surgery on face     for ptosis of rt. eyelid  . PORTACATH PLACEMENT     power port - right  . PPM  11/14/09   MDT Revo implanted by Dr Rayann Heman for syncope/ Mobitz II AV block    Family History  Problem Relation Age of Onset  . Heart attack Father   . Hypertension Father   . Heart disease Father   . Breast cancer Sister   . Cancer Sister        brain  . COPD Mother   . Cancer Brother        lung  . Cancer Other        colon  . Brain cancer Sister   . Diabetes Paternal  Grandmother      Social History   Social History  . Marital status: Single    Spouse name: N/A  . Number of children: N/A  . Years of education: N/A   Social History Main Topics  . Smoking status: Former Smoker    Packs/day: 0.30    Years: 8.00    Types: Cigarettes    Quit date: 02/10/1985  . Smokeless tobacco: Never Used     Comment: Quit 25 years back.   . Alcohol use No  . Drug use: No  . Sexual activity: Not Asked   Other Topics Concern  . None   Social History Narrative   Lives with her children. Disabled.     Outpatient Encounter Prescriptions as of 10/09/2016  Medication Sig  . Ado-Trastuzumab Emtansine (KADCYLA IV) Inject into the vein.  Marland Kitchen aspirin 81 MG EC tablet Take 81 mg by mouth daily.   . Calcium Carbonate-Vitamin D 600-400 MG-UNIT per tablet Take 1 tablet by mouth daily.   . clonazePAM (KLONOPIN) 1 MG tablet Take 1 mg by mouth 2 (two) times daily as needed for anxiety.   . clotrimazole-betamethasone (LOTRISONE) cream Apply 1 application topically 2 (two) times daily as needed (itching).   . cyclobenzaprine (FLEXERIL) 10 MG tablet Take 10 mg by mouth at bedtime. Muscle spasm  . DULoxetine (CYMBALTA) 60 MG capsule Take 60 mg by mouth every evening.   . fentaNYL (DURAGESIC - DOSED MCG/HR) 100 MCG/HR Place 400 mcg onto the skin every 3 (three) days.   . furosemide (LASIX) 40 MG tablet Take 40 mg by mouth.  . hydroxypropyl methylcellulose (ISOPTO TEARS) 2.5 % ophthalmic solution Place 1 drop into both eyes 4 (four) times daily as needed (dry eyes).  Marland Kitchen lidocaine-prilocaine (EMLA) cream Apply 1 application topically as needed (prior to chemo). For port (2.5%/2/5%)  . Multiple Vitamin (THERA) TABS Take 1 tablet by mouth at bedtime.   Marland Kitchen omeprazole (PRILOSEC) 20 MG capsule Take 20 mg by mouth at bedtime.   . Oxycodone HCl 20 MG TABS Take 40 mg by mouth every 3 (three) hours.   . potassium chloride SA (K-DUR,KLOR-CON) 20 MEQ tablet Take 20 mEq by mouth daily.   .  pregabalin (LYRICA) 100 MG capsule Take 100 mg by mouth 2 (two) times daily.  . promethazine (PHENERGAN) 25 MG tablet Take 25 mg by mouth every 8 (eight) hours as needed for nausea.   Marland Kitchen spironolactone (ALDACTONE) 25 MG tablet Take 25 mg by  mouth 2 (two) times daily.  . tamoxifen (NOLVADEX) 10 MG tablet Take by mouth.  . triamcinolone (KENALOG) 0.1 % paste Place 1 application onto teeth 2 (two) times daily as needed (affected area on gums or mouth.). Apply to gums or mouth.  . triamterene-hydrochlorothiazide (MAXZIDE-25) 37.5-25 MG tablet Take 1 tablet by mouth daily.  Marland Kitchen zolpidem (AMBIEN) 10 MG tablet Take 10-15 mg by mouth at bedtime.   . diclofenac sodium (VOLTAREN) 1 % GEL Place 2 g onto the skin 3 (three) times daily as needed (arthritis).    No facility-administered encounter medications on file as of 10/09/2016.     Review of Systems  Constitutional: Negative for diaphoresis and fever.  Respiratory: Negative for cough.   Musculoskeletal: Positive for arthralgias. Negative for neck pain.  Neurological: Negative for numbness.     Objective:   BP 106/64 (BP Location: Left Arm, Patient Position: Sitting, Cuff Size: Normal)   Pulse 84   Temp 98.5 F (36.9 C) (Other (Comment))   Resp 16   Ht 5' 3.5" (1.613 m)   Wt 153 lb 12.8 oz (69.8 kg)   SpO2 99%   BMI 26.82 kg/m   Physical Exam  Constitutional: She is oriented to person, place, and time. She appears well-developed and well-nourished.  HENT:  Head: Normocephalic and atraumatic.  Eyes: Pupils are equal, round, and reactive to light.  Neck: Normal range of motion. Neck supple.  Cardiovascular: Normal rate and regular rhythm.   Pulmonary/Chest: Effort normal. No respiratory distress.  Neurological: She is alert and oriented to person, place, and time.  Skin: Skin is warm and dry.  Psychiatric: Her mood appears anxious. Her affect is labile. Her affect is not blunt. She is slowed. She is not agitated, not aggressive and not  hyperactive. She expresses no homicidal and no suicidal ideation.     IAssessment and Plan   1. Chronic pain due to neoplasm  - Ambulatory Referral to Pain Clinic Spent over 45 minutes with counseling and coordinating care. Spent time with patient discussing narcotic use and refills.   I have reviewed the notes in Epic which discuss positive drug test for cocaine and negative test for fentanyl (despite being on patches).  I have looked up in the  controlled substance database, patient received prescription for oxycodone (#300) on 09/13/16. She also received fentanly patches (#40) at that time. She has een getting prescriptions for several different providers and sees several different doctors for management of pain over the past couple years.  I have discussed this with the patient and explained that I will not be prescribing her pain medications. I have offered referral to pain management. I have also talked with her about the meaning of the word palliative care. She is going to discuss this with her cancer care team.   I have also explained to patient that Lorrin Mais is not a good medication to mix with her current prescription medications.  She reports that she does not have any other medical issues that need to be addressed today.   Patient was very tearful during this discussion. I voiced to her that I am sorry that she is suffering with this disease burden but that taking narcotic medications and overuse of these medications is very serious.  She agrees to keep her follow up appointments with surgery (she no showed appointment yesterday) and Dr. Talbert Cage.   Patient denies any current mood disorder and denies that she has any opoid use disorder.  Patient will contact our  office if she needs any help or has any other medical needs other than opoid/benzodiazepine refills.  Records reviewed in detail.    2. Immunization due Tetanus shot given.    Caren Macadam, MD 10/09/2016

## 2016-10-09 NOTE — Progress Notes (Signed)
   Subjective:    Patient ID: Ashlee Moore, female    DOB: 08-04-1960, 56 y.o.   MRN: [id number]  HPI Here for new patient visit. Has breast cancer with metastasis to bone/lungs. Is followed by [facility].    Review of Systems     Objective:   Physical Exam        Assessment & Plan:

## 2016-10-10 ENCOUNTER — Telehealth (HOSPITAL_COMMUNITY): Payer: Self-pay

## 2016-10-10 NOTE — Telephone Encounter (Signed)
Nutrition Assessment   Reason for Assessment:   Patient identified on Malnutrition Screening Tool for weight loss and poor appetite  ASSESSMENT:  56 year old female with metastatic breast cancer. Past medical history of pacemaker, depression, anxiety, bipolar, s/p gastric bypass for obesity, right total mastectomy and prophylactic left mastectomy, positive for cocaine in April 2018.  Was been treated in [place].  Noted right upper abdominal mass biopsy done on 8/23. Patient currently on kadcyla chemotherapy.    Spoke with patient via phone this am.  Patient reports that she just got out of the grocery store and was planning to buy one thing but came out with $300 worth of groceries.  Reports she loves fruits and veggies.  States her appetite is really not that good is more of a social eater.  Reports that she likes most foods and eats good sources of protein.  Can't really tell me what she eats in a typical day.  Started talking about not being able to find a MD to refill her pain medications, became tearful.  Reports she does drink butter pecan ensure sometimes.    Nutrition Focused Physical Exam: deferred  Medications: calcium carbonate, Vit D, MVI, prilosec  Labs: Hgb 11.1, glucose 140  Anthropometrics:   Height: 63.5 inches Weight: 153 lb 12.8 oz BMI: 26  5% weight loss in the last month  Noted 161 lb on 09/18/2016 and 8/30 153 lb  Estimated Energy Needs  Kcals: 1750-2100 calories/d Protein: 88-105 g/d Fluid: 2.1 L/d  NUTRITION DIAGNOSIS: Inadequate oral intake related to poor appetite as evidenced by 5% weight loss in 1 month   MALNUTRITION DIAGNOSIS: likely but unable to determine   INTERVENTION:   Briefly discussed ways to increase calories and protein.  Patient receptive to receiving information mailed to her.  Will mail ways to Increase Calories and Protein and list of protein foods.   Will also mail contact information and patient to call me with questions     MONITORING, EVALUATION, GOAL: intake, weight trends   NEXT VISIT: as needed  Lequisha Cammack B. Zenia Resides, [place], [place] Registered Dietitian [phone] (pager)

## 2016-10-14 ENCOUNTER — Telehealth (HOSPITAL_COMMUNITY): Payer: Self-pay | Admitting: Emergency Medicine

## 2016-10-14 NOTE — Telephone Encounter (Signed)
Called [facility] with the results of her biopsy, it was fatty tissue.

## 2016-10-16 ENCOUNTER — Encounter (HOSPITAL_COMMUNITY): Payer: Self-pay | Admitting: Lab

## 2016-10-16 ENCOUNTER — Telehealth (HOSPITAL_COMMUNITY): Payer: Self-pay | Admitting: Emergency Medicine

## 2016-10-16 NOTE — Telephone Encounter (Signed)
Merna called this morning stating she needed a refill on her pain medication.  That we were suppose to refer her to a pain clinic.  As I investigated in EPIC further, Dr Talbert Cage on the last note states she is referring her back to her PCP for pain management.  She saw her PCP on 8/30 in which she refused to give [place] any pain medications.  She wanted to refer pt to pain clinic.  I explained to the pt to notify her PCP to send referral to the pain clinic.  She then calls and wants her records transferred to [facility].   Sending referral and records to [facility] for further oncological care per patient's request.

## 2016-10-23 ENCOUNTER — Ambulatory Visit (HOSPITAL_COMMUNITY): Payer: Medicare Other

## 2016-10-23 ENCOUNTER — Ambulatory Visit (HOSPITAL_COMMUNITY): Payer: Medicare Other | Admitting: Adult Health

## 2016-10-27 ENCOUNTER — Encounter (HOSPITAL_COMMUNITY): Payer: Self-pay | Admitting: Lab

## 2016-10-27 NOTE — Progress Notes (Unsigned)
Referral sent to [facility] w/ Dr Jacquiline Doe.  Records faxed on 9/17

## 2016-11-04 ENCOUNTER — Other Ambulatory Visit (HOSPITAL_COMMUNITY): Payer: Self-pay | Admitting: Hematology and Oncology

## 2016-11-04 DIAGNOSIS — C50919 Malignant neoplasm of unspecified site of unspecified female breast: Secondary | ICD-10-CM

## 2016-11-11 ENCOUNTER — Ambulatory Visit: Payer: Medicare Other | Admitting: Family Medicine

## 2016-11-13 ENCOUNTER — Ambulatory Visit: Payer: Self-pay | Admitting: Family Medicine

## 2016-11-13 ENCOUNTER — Ambulatory Visit: Payer: Medicaid Other | Admitting: Family Medicine

## 2016-11-14 ENCOUNTER — Encounter (HOSPITAL_COMMUNITY)
Admission: RE | Admit: 2016-11-14 | Discharge: 2016-11-14 | Disposition: A | Discharge status: Home / Self Care | Payer: Medicare Other | Source: Ambulatory Visit | Attending: Hematology and Oncology | Admitting: Hematology and Oncology

## 2016-11-14 DIAGNOSIS — C50919 Malignant neoplasm of unspecified site of unspecified female breast: Secondary | ICD-10-CM | POA: Insufficient documentation

## 2016-11-14 LAB — GLUCOSE, CAPILLARY: GLUCOSE-CAPILLARY: 96 mg/dL (ref 65–99)

## 2016-11-14 MED ORDER — FLUDEOXYGLUCOSE F - 18 (FDG) INJECTION
7.6000 | Freq: Once | INTRAVENOUS | Status: AC | PRN
  Administered 2016-11-14: 7.6 via INTRAVENOUS

## 2016-11-25 ENCOUNTER — Ambulatory Visit: Payer: Medicare Other | Admitting: Family Medicine

## 2016-12-08 ENCOUNTER — Encounter: Payer: Self-pay | Admitting: Physician Assistant

## 2016-12-08 ENCOUNTER — Ambulatory Visit (INDEPENDENT_AMBULATORY_CARE_PROVIDER_SITE_OTHER): Payer: Medicare Other | Admitting: Physician Assistant

## 2016-12-08 VITALS — BP 90/58 | HR 85 | Temp 97.1°F | Ht 63.5 in | Wt 158.0 lb

## 2016-12-08 DIAGNOSIS — C7951 Secondary malignant neoplasm of bone: Secondary | ICD-10-CM | POA: Diagnosis not present

## 2016-12-08 DIAGNOSIS — G893 Neoplasm related pain (acute) (chronic): Secondary | ICD-10-CM

## 2016-12-08 DIAGNOSIS — Z23 Encounter for immunization: Secondary | ICD-10-CM

## 2016-12-08 DIAGNOSIS — C50919 Malignant neoplasm of unspecified site of unspecified female breast: Secondary | ICD-10-CM

## 2016-12-08 NOTE — Patient Instructions (Signed)
In a few days you may receive a survey in the mail or online from [employer] regarding your visit with us today. Please take a moment to fill this out. Your feedback is very important to our whole office. It can help us better understand your needs as well as improve your experience and satisfaction. Thank you for taking your time to complete it. We care about you.  Tache Bobst, PA-C  

## 2016-12-08 NOTE — Progress Notes (Signed)
BP (!) 90/58   Pulse 85   Temp (!) 97.1 F (36.2 C) (Oral)   Ht 5' 3.5" (1.613 m)   Wt 158 lb (71.7 kg)   BMI 27.55 kg/m    Subjective:    Patient ID: Ashlee [facility], female    DOB: 1960-04-01, 55 y.o.   MRN: [id number]  HPI: Ashlee [facility] is a 56 y.o. female presenting on 12/08/2016 for New Patient (Initial Visit) and Establish Care  This is a new patient to our office.  She has had a 7-year battle with metastatic breast cancer.  She had been seen with Dr. Jacquiline Doe throughout this time.  She is also been a regular patient of Dr. Rayna Sexton.  I have reviewed all of her long-term care.  She is currently taking palliative therapy.  All her chronic pain medications are handled by oncology.  She does see cardiology once a year for a pacemaker check.  All of her other maintenance medications have refills at this time but will need to be filled in the coming months.  I have instructed her to let her pharmacy send the refills to [place].  We will update her on her flu vaccine today.  I have also let her know that our office sees our patients in an urgent care option daily.  There is one provider each day that would be able to see her.  I will plan for her to come back in 6 months.  Relevant past medical, surgical, family and social history reviewed and updated as indicated. Allergies and medications reviewed and updated.  Past Medical History:  Diagnosis Date  . Anemia   . Arthritis   . AV block, Mobitz 2 11/14/09   s/p MDT Revo PPM by JA  . Bipolar disorder ([place])   . Breast cancer metastasized to bone ([place]) 08/16/2012  . Cancer ([place])    right breast/LAST CHEOM 10/07/11  . Chills   . Chronic pain 12/13/2015  . Easy bruising   . Elevated LFTs    hx  . Fever   . Fibromyalgia   . Generalized headaches   . GERD (gastroesophageal reflux disease)   . Hypertension   . Morbid obesity with body mass index of 40.0-44.9 in adult ([place]) 10/22/2011   BMI 44.26    . Osteoporosis   . Pacemaker   . Sleep  apnea    STOP BANG SCORE 4  . Weakness     Past Surgical History:  Procedure Laterality Date  . BILATERAL KNEE ARTHROSCOPY    . BREAST SURGERY  2012,12   lumpectomy,mastectomy bil  . CHOLECYSTECTOMY    . ECTOPIC PREGNANCY SURGERY     x2  . gastric bypass surgery    . left ovary and fallopian tube removed due to chronic pain    . mastectomy  2013   bilateral  . MULTIPLE EXTRACTIONS WITH ALVEOLOPLASTY N/A 06/04/2012   Procedure: MULTIPLE EXTRACION WITH ALVEOLOPLASTY EXTRACT: 4, 5, 8, 9, 10, 13, 14 ,28;  Surgeon: Gae Bon, DDS;  Location: [facility];  Service: Oral Surgery;  Laterality: N/A;  . PACEMAKER INSERTION  2011  . plastic surgery on face     for ptosis of rt. eyelid  . PORTACATH PLACEMENT     power port - right  . PPM  11/14/09   MDT Revo implanted by Dr Rayann Heman for syncope/ Mobitz II AV block    Review of Systems  Constitutional: Positive for fatigue. Negative for activity change and fever.  HENT: Negative.   Eyes: Negative.   Respiratory: Negative.  Negative for cough, shortness of breath and wheezing.   Cardiovascular: Positive for leg swelling. Negative for chest pain and palpitations.  Gastrointestinal: Negative.  Negative for abdominal pain.  Endocrine: Negative.   Genitourinary: Negative.  Negative for dysuria.  Musculoskeletal: Positive for arthralgias, joint swelling and myalgias.  Skin: Negative.  Negative for color change and pallor.  Neurological: Negative.   Hematological: Negative.  Negative for adenopathy. Does not bruise/bleed easily.  Psychiatric/Behavioral: Negative for decreased concentration. The patient is not nervous/anxious.     Allergies as of 12/08/2016      Reactions   Penicillins Swelling   Sulfasalazine Rash   Sulfonamide Derivatives Rash   Latex    Tape Other (See Comments)   Adhesive tape- RASH, SWELLING All adhesives      Medication List       Accurate as of 12/08/16 11:12 AM. Always use your most recent med list.            AMBIEN 10 MG tablet Generic drug:  zolpidem Take 10-15 mg by mouth at bedtime.   aspirin 81 MG EC tablet Take 81 mg by mouth daily.   Calcium Carbonate-Vitamin D 600-400 MG-UNIT tablet Take 1 tablet by mouth daily.   clotrimazole-betamethasone cream Commonly known as:  LOTRISONE Apply 1 application topically 2 (two) times daily as needed (itching).   cyclobenzaprine 10 MG tablet Commonly known as:  FLEXERIL Take 10 mg by mouth at bedtime. Muscle spasm   DULoxetine 60 MG capsule Commonly known as:  CYMBALTA Take 60 mg by mouth daily.   fentaNYL 100 MCG/HR Commonly known as:  DURAGESIC - dosed mcg/hr Place 400 mcg onto the skin every 3 (three) days.   furosemide 40 MG tablet Commonly known as:  LASIX Take 40 mg by mouth.   hydroxypropyl methylcellulose / hypromellose 2.5 % ophthalmic solution Commonly known as:  ISOPTO TEARS / GONIOVISC Place 1 drop into both eyes 4 (four) times daily as needed (dry eyes).   KADCYLA IV Inject into the vein.   KLONOPIN 1 MG tablet Generic drug:  clonazePAM Take 1 mg by mouth 2 (two) times daily as needed for anxiety.   lidocaine-prilocaine cream Commonly known as:  EMLA Apply 1 application topically as needed (prior to chemo). For port (2.5%/2/5%)   NARCAN 4 MG/0.1ML Liqd nasal spray kit Generic drug:  naloxone USE AS DIRECTED ON PACKAGE FOR opioid emergency   omeprazole 20 MG capsule Commonly known as:  PRILOSEC Take 20 mg by mouth at bedtime.   Oxycodone HCl 20 MG Tabs Take 40 mg by mouth every 3 (three) hours.   potassium chloride SA 20 MEQ tablet Commonly known as:  K-DUR,KLOR-CON Take 20 mEq by mouth daily.   pregabalin 100 MG capsule Commonly known as:  LYRICA Take 100 mg by mouth 2 (two) times daily.   prochlorperazine 10 MG tablet Commonly known as:  COMPAZINE Take 10 mg by mouth every 6 (six) hours as needed.   promethazine 25 MG tablet Commonly known as:  PHENERGAN Take 25 mg by mouth every 8 (eight)  hours as needed for nausea.   spironolactone 25 MG tablet Commonly known as:  ALDACTONE Take 25 mg by mouth 2 (two) times daily.   tamoxifen 20 MG tablet Commonly known as:  NOLVADEX Take 20 mg by mouth.   THERA Tabs Take 1 tablet by mouth at bedtime.   triamcinolone 0.1 % paste Commonly known as:  KENALOG Place  1 application onto teeth 2 (two) times daily as needed (affected area on gums or mouth.). Apply to gums or mouth.   triamterene-hydrochlorothiazide 37.5-25 MG tablet Commonly known as:  MAXZIDE-25 Take 1 tablet by mouth daily.          Objective:    BP (!) 90/58   Pulse 85   Temp (!) 97.1 F (36.2 C) (Oral)   Ht 5' 3.5" (1.613 m)   Wt 158 lb (71.7 kg)   BMI 27.55 kg/m   Allergies  Allergen Reactions  . Penicillins Swelling  . Sulfasalazine Rash  . Sulfonamide Derivatives Rash  . Latex   . Tape Other (See Comments)    Adhesive tape- RASH, SWELLING   All adhesives    Physical Exam  Constitutional: She is oriented to person, place, and time. She appears well-developed and well-nourished.  HENT:  Head: Normocephalic and atraumatic.  Eyes: Pupils are equal, round, and reactive to light. Conjunctivae and EOM are normal.  Cardiovascular: Normal rate, regular rhythm, normal heart sounds and intact distal pulses.   Peripheral edema in upper arms and lower legs.  This is a known chronic condition  Pulmonary/Chest: Effort normal and breath sounds normal.  Abdominal: Soft. Bowel sounds are normal.  Neurological: She is alert and oriented to person, place, and time. She has normal reflexes.  Skin: Skin is warm and dry. No rash noted.  Psychiatric: She has a normal mood and affect. Her behavior is normal. Judgment and thought content normal.  Nursing note and vitals reviewed.       Assessment & Plan:   1. Chronic pain due to neoplasm Follow with Dr. Audree Camel, the office in [facility]. Chronic pain medications will be covered by  him. Fentanyl Oxycodone Lyrica  2. Carcinoma of breast metastatic to bone, unspecified laterality ([place]) - tamoxifen (NOLVADEX) 20 MG tablet; Take 20 mg by mouth. Kadcyla IV administered Continue with Dr. Jacquiline Doe and his clinic  3. Need for immunization against influenza - Flu Vaccine QUAD 36+ mos IM    Current Outpatient Prescriptions:  .  Ado-Trastuzumab Emtansine (KADCYLA IV), Inject into the vein., Disp: , Rfl:  .  aspirin 81 MG EC tablet, Take 81 mg by mouth daily. , Disp: , Rfl:  .  Calcium Carbonate-Vitamin D 600-400 MG-UNIT per tablet, Take 1 tablet by mouth daily. , Disp: , Rfl:  .  clonazePAM (KLONOPIN) 1 MG tablet, Take 1 mg by mouth 2 (two) times daily as needed for anxiety. , Disp: , Rfl:  .  clotrimazole-betamethasone (LOTRISONE) cream, Apply 1 application topically 2 (two) times daily as needed (itching). , Disp: , Rfl:  .  cyclobenzaprine (FLEXERIL) 10 MG tablet, Take 10 mg by mouth at bedtime. Muscle spasm, Disp: , Rfl:  .  DULoxetine (CYMBALTA) 60 MG capsule, Take 60 mg by mouth daily., Disp: , Rfl: 5 .  fentaNYL (DURAGESIC - DOSED MCG/HR) 100 MCG/HR, Place 400 mcg onto the skin every 3 (three) days. , Disp: , Rfl:  .  furosemide (LASIX) 40 MG tablet, Take 40 mg by mouth., Disp: , Rfl:  .  hydroxypropyl methylcellulose (ISOPTO TEARS) 2.5 % ophthalmic solution, Place 1 drop into both eyes 4 (four) times daily as needed (dry eyes)., Disp: , Rfl:  .  lidocaine-prilocaine (EMLA) cream, Apply 1 application topically as needed (prior to chemo). For port (2.5%/2/5%), Disp: , Rfl:  .  Multiple Vitamin (THERA) TABS, Take 1 tablet by mouth at bedtime. , Disp: , Rfl:  .  naloxone (NARCAN) nasal spray 4 mg/0.1 mL, USE AS DIRECTED ON PACKAGE FOR opioid emergency, Disp: , Rfl:  .  omeprazole (PRILOSEC) 20 MG capsule, Take 20 mg by mouth at bedtime. , Disp: , Rfl:  .  Oxycodone HCl 20 MG TABS, Take 40 mg by mouth every 3 (three) hours. , Disp: , Rfl:  .  potassium chloride SA  (K-DUR,KLOR-CON) 20 MEQ tablet, Take 20 mEq by mouth daily. , Disp: , Rfl:  .  pregabalin (LYRICA) 100 MG capsule, Take 100 mg by mouth 2 (two) times daily., Disp: , Rfl:  .  prochlorperazine (COMPAZINE) 10 MG tablet, Take 10 mg by mouth every 6 (six) hours as needed., Disp: , Rfl: 0 .  promethazine (PHENERGAN) 25 MG tablet, Take 25 mg by mouth every 8 (eight) hours as needed for nausea. , Disp: , Rfl:  .  spironolactone (ALDACTONE) 25 MG tablet, Take 25 mg by mouth 2 (two) times daily., Disp: , Rfl:  .  tamoxifen (NOLVADEX) 20 MG tablet, Take 20 mg by mouth., Disp: , Rfl:  .  triamterene-hydrochlorothiazide (MAXZIDE-25) 37.5-25 MG tablet, Take 1 tablet by mouth daily., Disp: , Rfl:  .  zolpidem (AMBIEN) 10 MG tablet, Take 10-15 mg by mouth at bedtime. , Disp: , Rfl:  .  triamcinolone (KENALOG) 0.1 % paste, Place 1 application onto teeth 2 (two) times daily as needed (affected area on gums or mouth.). Apply to gums or mouth., Disp: , Rfl:  Continue all other maintenance medications as listed above.  Follow up plan: Return in about 6 months (around 06/08/2017) for recheck.  Educational handout given for [place] PA-C [facility] [address]  [place], [place] [address] [phone]   12/08/2016, 11:12 AM

## 2016-12-18 ENCOUNTER — Ambulatory Visit: Payer: Medicare Other | Admitting: *Deleted

## 2016-12-19 ENCOUNTER — Other Ambulatory Visit: Payer: Self-pay | Admitting: Physician Assistant

## 2016-12-22 NOTE — Telephone Encounter (Signed)
Phoned in.

## 2017-01-15 ENCOUNTER — Other Ambulatory Visit: Payer: Self-pay | Admitting: Physician Assistant

## 2017-01-15 DIAGNOSIS — C50911 Malignant neoplasm of unspecified site of right female breast: Secondary | ICD-10-CM

## 2017-01-15 DIAGNOSIS — C7951 Secondary malignant neoplasm of bone: Principal | ICD-10-CM

## 2017-01-15 DIAGNOSIS — G894 Chronic pain syndrome: Secondary | ICD-10-CM

## 2017-01-23 ENCOUNTER — Telehealth: Payer: Self-pay

## 2017-01-23 NOTE — Telephone Encounter (Signed)
noted 

## 2017-01-23 NOTE — Telephone Encounter (Signed)
Insurance denied Lidocaine ointment

## 2017-02-16 ENCOUNTER — Other Ambulatory Visit: Payer: Self-pay | Admitting: Physician Assistant

## 2017-03-06 ENCOUNTER — Encounter (HOSPITAL_COMMUNITY): Payer: Self-pay | Admitting: Emergency Medicine

## 2017-03-06 ENCOUNTER — Inpatient Hospital Stay (HOSPITAL_COMMUNITY)
Admission: EM | Admit: 2017-03-06 | Discharge: 2017-03-10 | DRG: 543 | Disposition: A | Discharge status: Home Health | Payer: Medicare Other | Attending: Family Medicine | Admitting: Family Medicine

## 2017-03-06 ENCOUNTER — Ambulatory Visit (HOSPITAL_COMMUNITY)
Admission: EM | Admit: 2017-03-06 | Discharge: 2017-03-06 | Disposition: A | Discharge status: Home / Self Care | Payer: Medicare Other | Source: Home / Self Care | Attending: Emergency Medicine | Admitting: Emergency Medicine

## 2017-03-06 ENCOUNTER — Emergency Department (HOSPITAL_COMMUNITY): Admission: EM | Admit: 2017-03-06 | Payer: Medicare Other | Source: Other Acute Inpatient Hospital

## 2017-03-06 DIAGNOSIS — C50911 Malignant neoplasm of unspecified site of right female breast: Secondary | ICD-10-CM | POA: Diagnosis not present

## 2017-03-06 DIAGNOSIS — F319 Bipolar disorder, unspecified: Secondary | ICD-10-CM | POA: Diagnosis present

## 2017-03-06 DIAGNOSIS — M1990 Unspecified osteoarthritis, unspecified site: Secondary | ICD-10-CM | POA: Diagnosis present

## 2017-03-06 DIAGNOSIS — E876 Hypokalemia: Secondary | ICD-10-CM

## 2017-03-06 DIAGNOSIS — C799 Secondary malignant neoplasm of unspecified site: Secondary | ICD-10-CM

## 2017-03-06 DIAGNOSIS — R338 Other retention of urine: Secondary | ICD-10-CM

## 2017-03-06 DIAGNOSIS — G834 Cauda equina syndrome: Secondary | ICD-10-CM | POA: Diagnosis present

## 2017-03-06 DIAGNOSIS — Z79810 Long term (current) use of selective estrogen receptor modulators (SERMs): Secondary | ICD-10-CM | POA: Diagnosis not present

## 2017-03-06 DIAGNOSIS — K219 Gastro-esophageal reflux disease without esophagitis: Secondary | ICD-10-CM | POA: Diagnosis present

## 2017-03-06 DIAGNOSIS — Z7982 Long term (current) use of aspirin: Secondary | ICD-10-CM | POA: Diagnosis not present

## 2017-03-06 DIAGNOSIS — G4730 Sleep apnea, unspecified: Secondary | ICD-10-CM | POA: Diagnosis present

## 2017-03-06 DIAGNOSIS — R739 Hyperglycemia, unspecified: Secondary | ICD-10-CM | POA: Diagnosis not present

## 2017-03-06 DIAGNOSIS — C7951 Secondary malignant neoplasm of bone: Principal | ICD-10-CM | POA: Diagnosis present

## 2017-03-06 DIAGNOSIS — G893 Neoplasm related pain (acute) (chronic): Secondary | ICD-10-CM

## 2017-03-06 DIAGNOSIS — G839 Paralytic syndrome, unspecified: Secondary | ICD-10-CM

## 2017-03-06 DIAGNOSIS — M797 Fibromyalgia: Secondary | ICD-10-CM | POA: Diagnosis present

## 2017-03-06 DIAGNOSIS — Z79899 Other long term (current) drug therapy: Secondary | ICD-10-CM | POA: Diagnosis not present

## 2017-03-06 DIAGNOSIS — R339 Retention of urine, unspecified: Secondary | ICD-10-CM | POA: Diagnosis present

## 2017-03-06 DIAGNOSIS — I10 Essential (primary) hypertension: Secondary | ICD-10-CM | POA: Diagnosis present

## 2017-03-06 DIAGNOSIS — Z9013 Acquired absence of bilateral breasts and nipples: Secondary | ICD-10-CM | POA: Diagnosis not present

## 2017-03-06 DIAGNOSIS — C50919 Malignant neoplasm of unspecified site of unspecified female breast: Secondary | ICD-10-CM | POA: Diagnosis present

## 2017-03-06 DIAGNOSIS — M810 Age-related osteoporosis without current pathological fracture: Secondary | ICD-10-CM | POA: Diagnosis present

## 2017-03-06 DIAGNOSIS — Z950 Presence of cardiac pacemaker: Secondary | ICD-10-CM

## 2017-03-06 DIAGNOSIS — G8220 Paraplegia, unspecified: Secondary | ICD-10-CM

## 2017-03-06 DIAGNOSIS — Z91048 Other nonmedicinal substance allergy status: Secondary | ICD-10-CM

## 2017-03-06 DIAGNOSIS — Z880 Allergy status to penicillin: Secondary | ICD-10-CM

## 2017-03-06 DIAGNOSIS — Z803 Family history of malignant neoplasm of breast: Secondary | ICD-10-CM | POA: Diagnosis not present

## 2017-03-06 DIAGNOSIS — I441 Atrioventricular block, second degree: Secondary | ICD-10-CM | POA: Diagnosis present

## 2017-03-06 DIAGNOSIS — Y92009 Unspecified place in unspecified non-institutional (private) residence as the place of occurrence of the external cause: Secondary | ICD-10-CM

## 2017-03-06 DIAGNOSIS — T380X5A Adverse effect of glucocorticoids and synthetic analogues, initial encounter: Secondary | ICD-10-CM | POA: Diagnosis not present

## 2017-03-06 DIAGNOSIS — E162 Hypoglycemia, unspecified: Secondary | ICD-10-CM

## 2017-03-06 DIAGNOSIS — Z9884 Bariatric surgery status: Secondary | ICD-10-CM

## 2017-03-06 DIAGNOSIS — Z87891 Personal history of nicotine dependence: Secondary | ICD-10-CM

## 2017-03-06 DIAGNOSIS — Z91040 Latex allergy status: Secondary | ICD-10-CM

## 2017-03-06 DIAGNOSIS — W07XXXA Fall from chair, initial encounter: Secondary | ICD-10-CM | POA: Diagnosis present

## 2017-03-06 DIAGNOSIS — C7931 Secondary malignant neoplasm of brain: Secondary | ICD-10-CM | POA: Diagnosis not present

## 2017-03-06 DIAGNOSIS — Z882 Allergy status to sulfonamides status: Secondary | ICD-10-CM

## 2017-03-06 LAB — CBG MONITORING, ED
GLUCOSE-CAPILLARY: 49 mg/dL — AB (ref 65–99)
Glucose-Capillary: 115 mg/dL — ABNORMAL HIGH (ref 65–99)

## 2017-03-06 LAB — CBC WITH DIFFERENTIAL/PLATELET
BASOS ABS: 0 10*3/uL (ref 0.0–0.1)
BASOS PCT: 0 %
EOS PCT: 0 %
Eosinophils Absolute: 0 10*3/uL (ref 0.0–0.7)
HCT: 35 % — ABNORMAL LOW (ref 36.0–46.0)
Hemoglobin: 10.6 g/dL — ABNORMAL LOW (ref 12.0–15.0)
LYMPHS PCT: 20 %
Lymphs Abs: 1.2 10*3/uL (ref 0.7–4.0)
MCH: 24.1 pg — ABNORMAL LOW (ref 26.0–34.0)
MCHC: 30.3 g/dL (ref 30.0–36.0)
MCV: 79.7 fL (ref 78.0–100.0)
MONO ABS: 0.7 10*3/uL (ref 0.1–1.0)
Monocytes Relative: 11 %
Neutro Abs: 4.1 10*3/uL (ref 1.7–7.7)
Neutrophils Relative %: 69 %
PLATELETS: 238 10*3/uL (ref 150–400)
RBC: 4.39 MIL/uL (ref 3.87–5.11)
RDW: 18 % — AB (ref 11.5–15.5)
WBC: 6 10*3/uL (ref 4.0–10.5)

## 2017-03-06 LAB — I-STAT CHEM 8, ED
BUN: 8 mg/dL (ref 6–20)
CALCIUM ION: 1.15 mmol/L (ref 1.15–1.40)
Chloride: 106 mmol/L (ref 101–111)
Creatinine, Ser: 0.5 mg/dL (ref 0.44–1.00)
Glucose, Bld: 30 mg/dL — CL (ref 65–99)
HEMATOCRIT: 37 % (ref 36.0–46.0)
Hemoglobin: 12.6 g/dL (ref 12.0–15.0)
Potassium: 3 mmol/L — ABNORMAL LOW (ref 3.5–5.1)
SODIUM: 145 mmol/L (ref 135–145)
TCO2: 24 mmol/L (ref 22–32)

## 2017-03-06 MED ORDER — CALCIUM CARBONATE-VITAMIN D 600-400 MG-UNIT PO TABS: 1.0000 | ORAL_TABLET | Freq: Every day | ORAL | Status: DC

## 2017-03-06 MED ORDER — TRIAMTERENE-HCTZ 37.5-25 MG PO TABS
1.0000 | ORAL_TABLET | Freq: Every morning | ORAL | Status: DC
  Administered 2017-03-07 – 2017-03-10 (×4): 1 via ORAL
  Filled 2017-03-06 (×5): qty 1

## 2017-03-06 MED ORDER — HYPROMELLOSE (GONIOSCOPIC) 2.5 % OP SOLN: 1.0000 [drp] | Freq: Four times a day (QID) | OPHTHALMIC | Status: DC | PRN

## 2017-03-06 MED ORDER — CLONAZEPAM 1 MG PO TABS
1.0000 mg | ORAL_TABLET | Freq: Two times a day (BID) | ORAL | Status: DC | PRN
  Administered 2017-03-07: 1 mg via ORAL
  Filled 2017-03-06: qty 1

## 2017-03-06 MED ORDER — POTASSIUM CHLORIDE CRYS ER 20 MEQ PO TBCR
20.0000 meq | EXTENDED_RELEASE_TABLET | Freq: Every day | ORAL | Status: DC
  Administered 2017-03-07 – 2017-03-10 (×4): 20 meq via ORAL
  Filled 2017-03-06 (×4): qty 1

## 2017-03-06 MED ORDER — ONDANSETRON HCL 4 MG PO TABS: 8.0000 mg | ORAL_TABLET | Freq: Two times a day (BID) | ORAL | Status: DC | PRN

## 2017-03-06 MED ORDER — LIDOCAINE-PRILOCAINE 2.5-2.5 % EX CREA
1.0000 "application " | TOPICAL_CREAM | CUTANEOUS | Status: DC | PRN
  Filled 2017-03-06: qty 5

## 2017-03-06 MED ORDER — HYDROMORPHONE HCL 1 MG/ML IJ SOLN
1.0000 mg | Freq: Once | INTRAMUSCULAR | Status: AC
  Administered 2017-03-06: 1 mg via INTRAVENOUS
  Filled 2017-03-06: qty 1

## 2017-03-06 MED ORDER — ONDANSETRON HCL 4 MG PO TABS: 4.0000 mg | ORAL_TABLET | Freq: Four times a day (QID) | ORAL | Status: DC | PRN

## 2017-03-06 MED ORDER — OXYCODONE HCL 5 MG PO TABS
40.0000 mg | ORAL_TABLET | Freq: Three times a day (TID) | ORAL | Status: DC | PRN
  Administered 2017-03-07: 40 mg via ORAL
  Filled 2017-03-06: qty 8

## 2017-03-06 MED ORDER — PREGABALIN 100 MG PO CAPS
100.0000 mg | ORAL_CAPSULE | Freq: Every day | ORAL | Status: DC
  Administered 2017-03-07 – 2017-03-10 (×4): 100 mg via ORAL
  Filled 2017-03-06 (×4): qty 1

## 2017-03-06 MED ORDER — DEXTROSE 50 % IV SOLN
1.0000 | Freq: Once | INTRAVENOUS | Status: DC
  Filled 2017-03-06: qty 50

## 2017-03-06 MED ORDER — DEXAMETHASONE SODIUM PHOSPHATE 10 MG/ML IJ SOLN
10.0000 mg | Freq: Four times a day (QID) | INTRAMUSCULAR | Status: DC
  Administered 2017-03-06 – 2017-03-09 (×9): 10 mg via INTRAVENOUS
  Filled 2017-03-06 (×10): qty 1

## 2017-03-06 MED ORDER — FENTANYL 100 MCG/HR TD PT72: 200.0000 ug | MEDICATED_PATCH | TRANSDERMAL | Status: DC

## 2017-03-06 MED ORDER — CALCIUM CARBONATE-VITAMIN D 500-200 MG-UNIT PO TABS
1.0000 | ORAL_TABLET | Freq: Every day | ORAL | Status: DC
  Administered 2017-03-07 – 2017-03-10 (×4): 1 via ORAL
  Filled 2017-03-06 (×5): qty 1

## 2017-03-06 MED ORDER — ENOXAPARIN SODIUM 40 MG/0.4ML ~~LOC~~ SOLN
40.0000 mg | Freq: Every day | SUBCUTANEOUS | Status: DC
  Administered 2017-03-07 – 2017-03-09 (×3): 40 mg via SUBCUTANEOUS
  Filled 2017-03-06 (×4): qty 0.4

## 2017-03-06 MED ORDER — POTASSIUM CHLORIDE CRYS ER 20 MEQ PO TBCR
40.0000 meq | EXTENDED_RELEASE_TABLET | Freq: Once | ORAL | Status: AC
  Administered 2017-03-06: 40 meq via ORAL
  Filled 2017-03-06: qty 2

## 2017-03-06 MED ORDER — ADULT MULTIVITAMIN W/MINERALS CH
1.0000 | ORAL_TABLET | Freq: Every day | ORAL | Status: DC
  Administered 2017-03-06 – 2017-03-09 (×4): 1 via ORAL
  Filled 2017-03-06 (×4): qty 1

## 2017-03-06 MED ORDER — TAMOXIFEN CITRATE 10 MG PO TABS
20.0000 mg | ORAL_TABLET | Freq: Every day | ORAL | Status: DC
  Administered 2017-03-07 – 2017-03-10 (×4): 20 mg via ORAL
  Filled 2017-03-06 (×5): qty 2

## 2017-03-06 MED ORDER — ONDANSETRON HCL 4 MG/2ML IJ SOLN: 4.0000 mg | Freq: Four times a day (QID) | INTRAMUSCULAR | Status: DC | PRN

## 2017-03-06 MED ORDER — ENOXAPARIN SODIUM 40 MG/0.4ML ~~LOC~~ SOLN
40.0000 mg | Freq: Every day | SUBCUTANEOUS | Status: DC
  Administered 2017-03-07: 40 mg via SUBCUTANEOUS
  Filled 2017-03-06: qty 0.4

## 2017-03-06 MED ORDER — OXYCODONE HCL 20 MG PO TABS: 40.0000 mg | ORAL_TABLET | ORAL | Status: DC | PRN

## 2017-03-06 MED ORDER — CYCLOBENZAPRINE HCL 10 MG PO TABS: 10.0000 mg | ORAL_TABLET | Freq: Every day | ORAL | Status: DC

## 2017-03-06 MED ORDER — ACETAMINOPHEN 650 MG RE SUPP: 650.0000 mg | Freq: Four times a day (QID) | RECTAL | Status: DC | PRN

## 2017-03-06 MED ORDER — ACETAMINOPHEN 325 MG PO TABS: 650.0000 mg | ORAL_TABLET | Freq: Four times a day (QID) | ORAL | Status: DC | PRN

## 2017-03-06 NOTE — ED Notes (Signed)
Notified [facility] for transport to [facility]

## 2017-03-06 NOTE — ED Notes (Signed)
Bed: WA21 Expected date:  Expected time:  Means of arrival:  Comments: Hold for [place]

## 2017-03-06 NOTE — ED Provider Notes (Addendum)
[facility] DEPT Provider Note   CSN: [id number] Arrival date & time: 03/06/17  1357     History   Chief Complaint Chief Complaint  Patient presents with  . paralyzed from waist down    HPI Ashlee [facility] is a 57 y.o. female.  HPI Patient presents with weakness and numbness of both lower extremities.  Began yesterday morning.  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  States she did not come in yesterday because she thought it would get better.  No loss of bladder or bowel control.  Was reportedly able to stand up to get in the car with assistance.  States she is able to wiggle her toes but cannot feel her legs.  No new back pain.  No fevers or chills.  States she called her oncologist and was called to come [facility].  2 days ago she had a fall where she slipped down around 2 feet out of a chair. Past Medical History:  Diagnosis Date  . Anemia   . Arthritis   . AV block, Mobitz 2 11/14/09   s/p MDT Revo PPM by JA  . Bipolar disorder ([place])   . Breast cancer metastasized to bone ([place]) 08/16/2012  . Cancer ([place])    right breast/LAST CHEOM 10/07/11  . Chills   . Chronic pain 12/13/2015  . Easy bruising   . Elevated LFTs    hx  . Fever   . Fibromyalgia   . Generalized headaches   . GERD (gastroesophageal reflux disease)   . Hypertension   . Morbid obesity with body mass index of 40.0-44.9 in adult ([place]) 10/22/2011   BMI 44.26    . Osteoporosis   . Pacemaker   . Sleep apnea    STOP BANG SCORE 4  . Weakness     Patient Active Problem List   Diagnosis Date Noted  . Chronic pain due to neoplasm 10/09/2016  . Abdominal mass, RUQ (right upper quadrant) 09/29/2016  . Breast cancer metastasized to bone ([place]) 08/16/2012  . Breast cancer, right breast ([place]) 09/19/2011  . Mobitz type II atrioventricular block 11/09/2009    Past Surgical History:  Procedure Laterality Date  . BILATERAL KNEE ARTHROSCOPY    . BREAST SURGERY   2012,12   lumpectomy,mastectomy bil  . CHOLECYSTECTOMY    . ECTOPIC PREGNANCY SURGERY     x2  . gastric bypass surgery    . left ovary and fallopian tube removed due to chronic pain    . mastectomy  2013   bilateral  . MULTIPLE EXTRACTIONS WITH ALVEOLOPLASTY N/A 06/04/2012   Procedure: MULTIPLE EXTRACION WITH ALVEOLOPLASTY EXTRACT: 4, 5, 8, 9, 10, 13, 14 ,28;  Surgeon: Gae Bon, DDS;  Location: [facility];  Service: Oral Surgery;  Laterality: N/A;  . PACEMAKER INSERTION  2011  . plastic surgery on face     for ptosis of rt. eyelid  . PORTACATH PLACEMENT     power port - right  . PPM  11/14/09   MDT Revo implanted by Dr Rayann Heman for syncope/ Mobitz II AV block    OB History    No data available       Home Medications    Prior to Admission medications   Medication Sig Start Date End Date Taking? Authorizing Provider  Ado-Trastuzumab Emtansine (KADCYLA IV) Inject into the vein.    [provider]  aspirin 81 MG EC tablet Take 81 mg by mouth daily.  [provider]  Calcium Carbonate-Vitamin D 600-400 MG-UNIT per tablet Take 1 tablet by mouth daily.  05/27/13   [provider]  clonazePAM (KLONOPIN) 1 MG tablet Take 1 mg by mouth 2 (two) times daily as needed for anxiety.     [provider]  clotrimazole-betamethasone (LOTRISONE) cream Apply 1 application topically 2 (two) times daily as needed (itching).     [provider]  cyclobenzaprine (FLEXERIL) 10 MG tablet TAKE 1 TABLET BY MOUTH AT BEDTIME FOR MUSCLE SPASMS 02/17/17   Terald Sleeper, PA-C  DULoxetine (CYMBALTA) 60 MG capsule Take 60 mg by mouth daily. 11/17/16   [provider]  fentaNYL (DURAGESIC - DOSED MCG/HR) 100 MCG/HR Place 400 mcg onto the skin every 3 (three) days.     [provider]  furosemide (LASIX) 40 MG tablet Take 40 mg by mouth.    [provider]  hydroxypropyl methylcellulose (ISOPTO TEARS) 2.5 % ophthalmic solution Place 1 drop into  both eyes 4 (four) times daily as needed (dry eyes).    [provider]  lidocaine (XYLOCAINE) 5 % ointment APPLY 1-2 GM (2 PUMPS) FOUR TIMES DAILY TO PAIN AREA AND RUB IN WELL - 12/19/16 FAXED FOR PA. SB 01/15/17   Terald Sleeper, PA-C  lidocaine-prilocaine (EMLA) cream Apply 1 application topically as needed (prior to chemo). For port (2.5%/2/5%)    [provider]  LYRICA 100 MG capsule TAKE 1 CAPSULE BY MOUTH TWICE DAILY FOR PAIN 12/22/16   Terald Sleeper, PA-C  Multiple Vitamin (THERA) TABS Take 1 tablet by mouth at bedtime.  05/27/13   [provider]  naloxone [facility]) nasal spray 4 mg/0.1 mL USE AS DIRECTED ON PACKAGE FOR opioid emergency 09/27/16   [provider]  omeprazole (PRILOSEC) 20 MG capsule Take 20 mg by mouth at bedtime.     [provider]  ondansetron (ZOFRAN) 8 MG tablet TAKE 1 TABLET BY MOUTH EVERY 12 HOURS AS NEEDED 02/17/17   Terald Sleeper, PA-C  Oxycodone HCl 20 MG TABS Take 40 mg by mouth every 3 (three) hours.     [provider]  potassium chloride SA (K-DUR,KLOR-CON) 20 MEQ tablet Take 20 mEq by mouth daily.  08/10/12   [provider]  prochlorperazine (COMPAZINE) 10 MG tablet Take 10 mg by mouth every 6 (six) hours as needed. 11/19/16   [provider]  promethazine (PHENERGAN) 25 MG tablet Take 25 mg by mouth every 8 (eight) hours as needed for nausea.     [provider]  spironolactone (ALDACTONE) 25 MG tablet Take 25 mg by mouth 2 (two) times daily.    [provider]  tamoxifen (NOLVADEX) 20 MG tablet Take 20 mg by mouth. 09/19/16   [provider]  triamcinolone (KENALOG) 0.1 % paste Place 1 application onto teeth 2 (two) times daily as needed (affected area on gums or mouth.). Apply to gums or mouth.    [provider]  triamterene-hydrochlorothiazide (MAXZIDE-25) 37.5-25 MG tablet TAKE 1 TABLET BY MOUTH EVERY MORNING 01/15/17   Terald Sleeper, PA-C  zolpidem  (AMBIEN) 10 MG tablet Take 10-15 mg by mouth at bedtime.     [provider]    Family History Family History  Problem Relation Age of Onset  . Heart attack Father   . Hypertension Father   . Heart disease Father   . Breast cancer Sister   . Cancer Sister        brain  .  COPD Mother   . Cancer Brother        lung  . Cancer Other        colon  . Brain cancer Sister   . Diabetes Paternal Grandmother     Social History Social History   Tobacco Use  . Smoking status: Former Smoker    Packs/day: 0.30    Years: 8.00    Pack years: 2.40    Types: Cigarettes    Last attempt to quit: 02/10/1985    Years since quitting: 32.0  . Smokeless tobacco: Never Used  . Tobacco comment: Quit 25 years back.   Substance Use Topics  . Alcohol use: No  . Drug use: No     Allergies   Penicillins; Sulfasalazine; Sulfonamide derivatives; Latex; and Tape   Review of Systems Review of Systems  Constitutional: Negative for appetite change.  HENT: Negative for congestion.   Respiratory: Negative for chest tightness and shortness of breath.   Gastrointestinal: Negative for abdominal pain.  Genitourinary: Negative for flank pain.  Musculoskeletal: Positive for back pain. Negative for neck pain.  Neurological: Positive for weakness and numbness. Negative for headaches.  Hematological: Negative for adenopathy.  Psychiatric/Behavioral: Negative for confusion.     Physical Exam Updated Vital Signs BP 121/75 (BP Location: Left Arm)   Pulse 95   Temp 98.2 F (36.8 C) (Oral)   Resp 17   SpO2 100%   Physical Exam  Constitutional: She is oriented to person, place, and time. She appears well-developed.  HENT:  Head: Atraumatic.  Neck: Neck supple.  Cardiovascular: Normal rate.  Pulmonary/Chest: Effort normal.  Pacemaker to left chest wall and Port-A-Cath to right chest wall.  Abdominal: There is no tenderness.  Musculoskeletal: Normal range of motion. She exhibits no  tenderness.  Neurological: She is alert and oriented to person, place, and time.  Has flexion and extension at the ankle.  No real strength to extension at the knee.  Is able to attempt to raise the lower extremities at the hip and is not able to raise against gravity but is able to move a little bit side to side.  Sensation decreased over bilateral lower extremities from hips down.  Does appear to be circumferential.  Skin: Capillary refill takes less than 2 seconds.  Psychiatric: She has a normal mood and affect.     ED Treatments / Results  Labs (all labs ordered are listed, but only abnormal results are displayed) Labs Reviewed  CBC WITH DIFFERENTIAL/PLATELET - Abnormal; Notable for the following components:      Result Value   Hemoglobin 10.6 (*)    HCT 35.0 (*)    MCH 24.1 (*)    RDW 18.0 (*)    All other components within normal limits  I-STAT CHEM 8, ED - Abnormal; Notable for the following components:   Potassium 3.0 (*)    Glucose, Bld 30 (*)    All other components within normal limits  CBG MONITORING, ED - Abnormal; Notable for the following components:   Glucose-Capillary 49 (*)    All other components within normal limits    EKG  EKG Interpretation None       Radiology No results found.  Procedures Procedures (including critical care time)  Medications Ordered in ED Medications  dextrose 50 % solution 50 mL (not administered)  HYDROmorphone (DILAUDID) injection 1 mg (1 mg Intravenous Given 03/06/17 1514)     Initial Impression / Assessment and Plan / ED Course  I have  reviewed the triage vital signs and the nursing notes.  Pertinent labs & imaging results that were available during my care of the patient were reviewed by me and considered in my medical decision making (see chart for details).     Patient with new neurologic deficits On both lower extremities.  Known metastatic disease to T10.  Discussed with radiology about MRI since the patient has  a [employer] pacemaker.  Patient will have to be done at [facility] will be there until 5:00.  Discussed with Dr. Ellender Hose in the ER who accepted the patient in transfer.  Lab work showed a CBG of 30.  Rechecked on fingerstick and was 49.  Will give D50.  Also discussed with MRI tech from [facility] and states that [facility] needs to be be called by me to arrange for nursing to be in the MRI with the patient.  Final Clinical Impressions(s) / ED Diagnoses   Final diagnoses:  Metastatic cancer ([place])  Paralysis ([place])  Hypoglycemia    ED Discharge Orders    None       Davonna Belling, MD 03/06/17 [place]    Davonna Belling, MD 03/06/17 1544

## 2017-03-06 NOTE — ED Notes (Signed)
Low glucose reported to [facility] MD

## 2017-03-06 NOTE — H&P (Signed)
History and Physical    Ashlee Moore ZHG:[id number] DOB: [birth year] DOA: 03/06/2017  PCP: Terald Sleeper, PA-C  Patient coming from: Home  I have personally briefly reviewed patient's old medical records in [place]  Chief Complaint: New onset BLE paralysis  HPI: Ashlee Moore is a 57 y.o. female with medical history significant of BRCA with mets to bone, known mets to T10 and L5, chronic back / leg pain.  Denies loss of bladder or bowel control (subjectively).  Onset yesterday AM.  Thought it "would get better" so didn't come in yesterday.  Today it persisted, and patient presents to the ED.  She is able to wiggle her toes, cant feel her legs.  No leg strength.  No back pain, actually her chronic pain is gone for once due to new numbness she states.  Was reportedly able to get into car with assistance.   ED Course: Patient with profound motor and sensory deficits of BLE.  Essentially complete paralysis from the waist down.  MRI performed at [facility] (due to presence of MRI compatible pacer), reveals: Multifocal nodular signal abnormalities of the upper cauda equina. This is concerning for intrathecal metastatic disease.  Postcontrast MRI is recommended for further characterization.   Review of Systems: As per HPI otherwise 10 point review of systems negative.   Past Medical History:  Diagnosis Date  . Anemia   . Arthritis   . AV block, Mobitz 2 11/14/09   s/p MDT Revo PPM by JA  . Bipolar disorder ([place])   . Breast cancer metastasized to bone ([place]) 08/16/2012  . Cancer ([place])    right breast/LAST CHEOM 10/07/11  . Chills   . Chronic pain 12/13/2015  . Easy bruising   . Elevated LFTs    hx  . Fever   . Fibromyalgia   . Generalized headaches   . GERD (gastroesophageal reflux disease)   . Hypertension   . Morbid obesity with body mass index of 40.0-44.9 in adult ([place]) 10/22/2011   BMI 44.26    . Osteoporosis   . Pacemaker   . Sleep apnea    STOP BANG SCORE 4  .  Weakness     Past Surgical History:  Procedure Laterality Date  . BILATERAL KNEE ARTHROSCOPY    . BREAST SURGERY  2012,12   lumpectomy,mastectomy bil  . CHOLECYSTECTOMY    . ECTOPIC PREGNANCY SURGERY     x2  . gastric bypass surgery    . left ovary and fallopian tube removed due to chronic pain    . mastectomy  2013   bilateral  . MULTIPLE EXTRACTIONS WITH ALVEOLOPLASTY N/A 06/04/2012   Procedure: MULTIPLE EXTRACION WITH ALVEOLOPLASTY EXTRACT: 4, 5, 8, 9, 10, 13, 14 ,28;  Surgeon: Gae Bon, DDS;  Location: [place];  Service: Oral Surgery;  Laterality: N/A;  . PACEMAKER INSERTION  2011  . plastic surgery on face     for ptosis of rt. eyelid  . PORTACATH PLACEMENT     power port - right  . PPM  11/14/09   MDT Revo implanted by Dr Rayann Heman for syncope/ Mobitz II AV block     reports that she quit smoking about 32 years ago. Her smoking use included cigarettes. She has a 2.40 pack-year smoking history. she has never used smokeless tobacco. She reports that she does not drink alcohol or use drugs.  Allergies  Allergen Reactions  . Penicillins Swelling  . Sulfasalazine Rash  . Sulfonamide Derivatives Rash  .  Latex     adhesive  . Tape Other (See Comments)    Adhesive tape- RASH, SWELLING   All adhesives    Family History  Problem Relation Age of Onset  . Heart attack Father   . Hypertension Father   . Heart disease Father   . Breast cancer Sister   . Cancer Sister        brain  . COPD Mother   . Cancer Brother        lung  . Cancer Other        colon  . Brain cancer Sister   . Diabetes Paternal Grandmother      Prior to Admission medications   Medication Sig Start Date End Date Taking? Authorizing Provider  Calcium Carbonate-Vitamin D 600-400 MG-UNIT per tablet Take 1 tablet by mouth daily.  05/27/13  Yes [provider]  clonazePAM (KLONOPIN) 1 MG tablet Take 1 mg by mouth 2 (two) times daily as needed for anxiety.    Yes [provider]    cyclobenzaprine (FLEXERIL) 10 MG tablet TAKE 1 TABLET BY MOUTH AT BEDTIME FOR MUSCLE SPASMS Patient taking differently: TAKE 10 mg TABLET BY MOUTH AT BEDTIME FOR MUSCLE SPASMS 02/17/17  Yes Terald Sleeper, PA-C  fentaNYL (DURAGESIC - DOSED MCG/HR) 100 MCG/HR Place 400 mcg onto the skin every 3 (three) days. Rotate   Yes [provider]  hydrochlorothiazide (HYDRODIURIL) 25 MG tablet Take 25 mg by mouth as needed.   Yes [provider]  hydroxypropyl methylcellulose (ISOPTO TEARS) 2.5 % ophthalmic solution Place 1 drop into both eyes 4 (four) times daily as needed (dry eyes).   Yes [provider]  lidocaine-prilocaine (EMLA) cream Apply 1 application topically as needed (prior to chemo). For port (2.5%/2/5%)   Yes [provider]  LYRICA 100 MG capsule TAKE 1 CAPSULE BY MOUTH TWICE DAILY FOR PAIN Patient taking differently: TAKE 100 mg CAPSULE BY MOUTH DAILY FOR PAIN 12/22/16  Yes Terald Sleeper, PA-C  Multiple Vitamin (THERA) TABS Take 1 tablet by mouth at bedtime.  05/27/13  Yes [provider]  ondansetron (ZOFRAN) 8 MG tablet TAKE 1 TABLET BY MOUTH EVERY 12 HOURS AS NEEDED Patient taking differently: TAKE 8 mg TABLET BY MOUTH EVERY 12 HOURS AS NEEDED 02/17/17  Yes Terald Sleeper, PA-C  Oxycodone HCl 20 MG TABS Take 40 mg by mouth every 3 (three) hours.    Yes [provider]  potassium chloride SA (K-DUR,KLOR-CON) 20 MEQ tablet Take 20 mEq by mouth daily.  08/10/12  Yes [provider]  tamoxifen (NOLVADEX) 20 MG tablet Take 20 mg by mouth. 09/19/16  Yes [provider]  triamterene-hydrochlorothiazide (MAXZIDE-25) 37.5-25 MG tablet TAKE 1 TABLET BY MOUTH EVERY MORNING 01/15/17  Yes Terald Sleeper, PA-C    Physical Exam: Vitals:   03/06/17 1415 03/06/17 1517 03/06/17 1858  BP: (!) 141/87 121/75 106/66  Pulse: 99 95 92  Resp: _0 Temp: 98.2 F (36.8 C)  98.4 F (36.9 C)  TempSrc: Oral  Oral  SpO2: 100% 100% 100%     Constitutional: NAD, calm, comfortable Eyes: PERRL, lids and conjunctivae normal ENMT: Mucous membranes are moist. Posterior pharynx clear of any exudate or lesions.Normal dentition.  Neck: normal, supple, no masses, no thyromegaly Respiratory: clear to auscultation bilaterally, no wheezing, no crackles. Normal respiratory effort. No accessory muscle use.  Cardiovascular: Regular rate and rhythm, no murmurs / rubs / gallops. No extremity edema. 2+ pedal pulses.  No carotid bruits. Pacemaker L chest wall, port-a-cath right chest wall Abdomen: no tenderness, no masses palpated. No hepatosplenomegaly. Bowel sounds positive.  Musculoskeletal: no clubbing / cyanosis. No joint deformity upper and lower extremities. Good ROM, no contractures. Normal muscle tone.  Skin: no rashes, lesions, ulcers. No induration Neurologic: Has flexion and extension at the ankle.  No real strength to extension at the knee.  Is able to attempt to raise the lower extremities at the hip and is not able to raise against gravity but is able to move a little bit side to side.  Sensation decreased over bilateral lower extremities from hips down.  Does appear to be circumferential.  Psychiatric: Normal judgment and insight. Alert and oriented x 3. Normal mood.    Labs on Admission: I have personally reviewed following labs and imaging studies  CBC: Recent Labs  Lab 03/06/17 1524 03/06/17 1532  WBC 6.0  --   NEUTROABS 4.1  --   HGB 10.6* 12.6  HCT 35.0* 37.0  MCV 79.7  --   PLT 238  --    Basic Metabolic Panel: Recent Labs  Lab 03/06/17 1532  NA 145  K 3.0*  CL 106  GLUCOSE 30*  BUN 8  CREATININE 0.50   GFR: CrCl cannot be calculated (Unknown ideal weight.). Liver Function Tests: No results for input(s): AST, ALT, ALKPHOS, BILITOT, PROT, ALBUMIN in the last 168 hours. No results for input(s): LIPASE, AMYLASE in the last 168 hours. No results for input(s): AMMONIA in the last 168 hours. Coagulation  Profile: No results for input(s): INR, PROTIME in the last 168 hours. Cardiac Enzymes: No results for input(s): CKTOTAL, CKMB, CKMBINDEX, TROPONINI in the last 168 hours. BNP (last 3 results) No results for input(s): PROBNP in the last 8760 hours. HbA1C: No results for input(s): HGBA1C in the last 72 hours. CBG: Recent Labs  Lab 03/06/17 1542 03/06/17 2146  GLUCAP 49* 115*   Lipid Profile: No results for input(s): CHOL, HDL, LDLCALC, TRIG, CHOLHDL, LDLDIRECT in the last 72 hours. Thyroid Function Tests: No results for input(s): TSH, T4TOTAL, FREET4, T3FREE, THYROIDAB in the last 72 hours. Anemia Panel: No results for input(s): VITAMINB12, FOLATE, FERRITIN, TIBC, IRON, RETICCTPCT in the last 72 hours. Urine analysis: No results found for: COLORURINE, APPEARANCEUR, LABSPEC, PHURINE, GLUCOSEU, HGBUR, BILIRUBINUR, KETONESUR, PROTEINUR, UROBILINOGEN, NITRITE, LEUKOCYTESUR  Radiological Exams on Admission: Mr Thoracic Spine Wo Contrast  Result Date: 03/06/2017 CLINICAL DATA:  Osseous metastatic disease.  Breast cancer EXAM: MRI THORACIC AND LUMBAR SPINE WITHOUT CONTRAST TECHNIQUE: Multiplanar and multiecho pulse sequences of the thoracic and lumbar spine were obtained without intravenous contrast. COMPARISON:  PET CT 11/14/2016 FINDINGS: MRI THORACIC SPINE FINDINGS Alignment:  Physiologic. Vertebrae: There is a low T1/T2-weighted signal lesion of the T10 vertebral body that extends into both pedicles and the adjacent ribs. There are no other low T1-weighted signal osseous lesions to suggest other metastatic deposits. Cord: Area of hyperintense T2-weighted signal within the ventral cord at the T4 level and within the central spinal cord at the T11 level. There is associated volume loss at the T4 level. Paraspinal and other soft tissues: Unremarkable Disc levels: There are small disc protrusions at T3-T4, T4-T5 and T12-L1 without central spinal canal stenosis. There is narrowing of the left  lateral recess at T12-L1. MRI LUMBAR SPINE FINDINGS Segmentation:  Standard. Alignment:  Grade 1 anterolisthesis at L4-L5. Vertebrae: Sclerotic lesion of L5. Otherwise normal marrow. No acute fracture. No discitis-osteomyelitis. L1 hemangioma. Conus medullaris and cauda equina: Conus  extends to the L1 level. There are multiple low signal lesions within the cauda equina (series 6 images 2, 4, 8, 9, 15). Paraspinal and other soft tissues: Negative. Disc levels: L1-L2: Normal L2-L3: Mild facet hypertrophy.  Normal disc.  No stenosis. L3-L4: No disc herniation or spinal canal stenosis. Moderate facet hypertrophy without foraminal stenosis. L4-L5: Severe bilateral facet hypertrophy with grade 1 anterolisthesis. Small pseudo disc bulge without stenosis. L5-S1: Mild disc bulge and moderate facet hypertrophy.  No stenosis. IMPRESSION: MR THORACIC SPINE IMPRESSION 1. Sclerotic lesion of the T10 vertebral body corresponds to the hypermetabolic lesion seen on the PET CT of 11/14/2016 and is consistent with osseous metastatic disease. 2. No other thoracic spine osseous metastases. 3. Multifocal hyperintense T2-weighted signal within the thoracic spinal cord, at the T4 and T11 levels. This most likely indicates chronic myelomalacia. MR LUMBAR SPINE IMPRESSION 1. Multifocal nodular signal abnormalities of the upper cauda equina. This is concerning for intrathecal metastatic disease. Postcontrast MRI is recommended for further characterization. 2. Sclerotic lesion of the L5 vertebral body corresponds to non-hypermetabolic sclerotic focus on the PET CT of 11/14/2016. 3. Severe L4-L5 facet arthrosis with resultant grade 1 anterolisthesis. Electronically Signed   By: Ulyses Jarred M.D.   On: 03/06/2017 18:29   Mr Lumbar Spine Wo Contrast  Result Date: 03/06/2017 CLINICAL DATA:  Osseous metastatic disease.  Breast cancer EXAM: MRI THORACIC AND LUMBAR SPINE WITHOUT CONTRAST TECHNIQUE: Multiplanar and multiecho pulse sequences of  the thoracic and lumbar spine were obtained without intravenous contrast. COMPARISON:  PET CT 11/14/2016 FINDINGS: MRI THORACIC SPINE FINDINGS Alignment:  Physiologic. Vertebrae: There is a low T1/T2-weighted signal lesion of the T10 vertebral body that extends into both pedicles and the adjacent ribs. There are no other low T1-weighted signal osseous lesions to suggest other metastatic deposits. Cord: Area of hyperintense T2-weighted signal within the ventral cord at the T4 level and within the central spinal cord at the T11 level. There is associated volume loss at the T4 level. Paraspinal and other soft tissues: Unremarkable Disc levels: There are small disc protrusions at T3-T4, T4-T5 and T12-L1 without central spinal canal stenosis. There is narrowing of the left lateral recess at T12-L1. MRI LUMBAR SPINE FINDINGS Segmentation:  Standard. Alignment:  Grade 1 anterolisthesis at L4-L5. Vertebrae: Sclerotic lesion of L5. Otherwise normal marrow. No acute fracture. No discitis-osteomyelitis. L1 hemangioma. Conus medullaris and cauda equina: Conus extends to the L1 level. There are multiple low signal lesions within the cauda equina (series 6 images 2, 4, 8, 9, 15). Paraspinal and other soft tissues: Negative. Disc levels: L1-L2: Normal L2-L3: Mild facet hypertrophy.  Normal disc.  No stenosis. L3-L4: No disc herniation or spinal canal stenosis. Moderate facet hypertrophy without foraminal stenosis. L4-L5: Severe bilateral facet hypertrophy with grade 1 anterolisthesis. Small pseudo disc bulge without stenosis. L5-S1: Mild disc bulge and moderate facet hypertrophy.  No stenosis. IMPRESSION: MR THORACIC SPINE IMPRESSION 1. Sclerotic lesion of the T10 vertebral body corresponds to the hypermetabolic lesion seen on the PET CT of 11/14/2016 and is consistent with osseous metastatic disease. 2. No other thoracic spine osseous metastases. 3. Multifocal hyperintense T2-weighted signal within the thoracic spinal cord, at  the T4 and T11 levels. This most likely indicates chronic myelomalacia. MR LUMBAR SPINE IMPRESSION 1. Multifocal nodular signal abnormalities of the upper cauda equina. This is concerning for intrathecal metastatic disease. Postcontrast MRI is recommended for further characterization. 2. Sclerotic lesion of the L5 vertebral body corresponds to non-hypermetabolic sclerotic focus on the PET  CT of 11/14/2016. 3. Severe L4-L5 facet arthrosis with resultant grade 1 anterolisthesis. Electronically Signed   By: Ulyses Jarred M.D.   On: 03/06/2017 18:29    EKG: Independently reviewed.  Assessment/Plan Principal Problem:   Paraplegia (HCC) Active Problems:   Breast cancer metastasized to bone [facility])   Chronic pain due to neoplasm   Metastatic cancer to spine (HCC)   Hypoglycemia   Hypokalemia    1. New onset paralysis of BLE - appears to be due to new mets to thecal sac of cauda equina on MRI w/o contrast of L spine 1. Per [facility] who EDP spoke with: 1. No surgical options for intrathecal lesions 2. No recs on their end for steroids 3. Will consult in AM 4. No need to keep patient at [facility] (vs [facility]). 2. Per Dr. Sondra Come (Rad onc): 1. Admit to [facility] 2. Decadron 19m IV Q6H 3. Plan to start radiation therapy tomorrow in hospital. [facility]. [facility]did discuss the pacer with him). 3. Will admit to [facility] 4. Start decadron 5. Will need foley if post void residual is high. 2. Metastatic BRCA - 1. Rad onc as above 2. Call heme/onc in AM - looks like her heme onc doc is actually at [facility] 3. Continue tamoxifen for now 3. Chronic pain due to neoplasm - 1. Actually patient reports she is not in pain now for once, now cant feel anything from waist down. 2. Looks like she takes up to 3-4 fentanyl patches at a time at home and rotates them (usually 3 at a time, takes 1 off and replaces it each day). 3. She shows the script which is to apply 4 fentanyl patches at once  of the 108m. 4. Discussed with patient, who wants to try and cut down on narcotics since not having pain due to new paralysis. 5. Reviewed with PharmD in person and with patient. 6. Will try 20021mat once for now, dont want her to go into withdrawal. 7. Oxy IR PNR if needed. 8. Continue Lyrica 9. Hold flexeril. 10. O2 monitor 4. Hypoglycemia - 1. Repeat BGL now after eating (also got d50) 2. She states she "always" gets low BGL if she doesn't eat 3. No h/o DM 4. Denies taking any DM meds 5. Hypokalemia - replace K, repeat BMP in AM  DVT prophylaxis: Lovenox Code Status: Full Family Communication: No family in room Disposition Plan: TBD Consults called: EDP spoke with PA from [facility] who was talking with Dr. DitCyndy Freeze spoke with Dr. KinSondra Cometh rad onc as above Admission status: Admit to inpatient - inpatient status for new BLE paralysis, acute cord syndrome.   GAREtta Quill [facility] Pager [phone] 7AM-7PM, please contact day team taking care of patient [email or web address] Password [email or web address]/25/2019, 10:09 PM

## 2017-03-06 NOTE — ED Provider Notes (Signed)
10:43 PM Assumed care from Dr. Alvino Chapel, please see their note for full history, physical and decision making until this point. In brief this is a 57 y.o. year old female who presented to the ED tonight with paralyzed from waist down     57 yo F here for MRI 2/2 decreased strength and sensation below the umbilicus with a history of cancer and back surgery.  On my exam patient has decreased sensation below T10.  She can wiggle her toes but has difficulty lifting or moving her legs otherwise.  MRI without any obvious cause so will discuss with neurosurgery.  Any with neurosurgery called back and stated that he spoke with Dr. Cyndy Freeze who reviewed MRI and thought she had metastatic disease to her spinal cord and there was no neurosurgical option and that we should admit to medicine to see if there is any medical options.  Other possibility would be radiation oncology.  Spoke with medicine who requested to talk to them again about whether or not the patient should stay here or go to [place] [facility] and also whether or not patient needs steroids.  Labs, studies and imaging reviewed by myself and considered in medical decision making if ordered. Imaging interpreted by radiology.  Labs Reviewed  CBC WITH DIFFERENTIAL/PLATELET - Abnormal; Notable for the following components:      Result Value   Hemoglobin 10.6 (*)    HCT 35.0 (*)    MCH 24.1 (*)    RDW 18.0 (*)    All other components within normal limits  I-STAT CHEM 8, ED - Abnormal; Notable for the following components:   Potassium 3.0 (*)    Glucose, Bld 30 (*)    All other components within normal limits  CBG MONITORING, ED - Abnormal; Notable for the following components:   Glucose-Capillary 49 (*)    All other components within normal limits  CBG MONITORING, ED - Abnormal; Notable for the following components:   Glucose-Capillary 115 (*)    All other components within normal limits  HIV ANTIBODY (ROUTINE TESTING)  BASIC METABOLIC PANEL     MR THORACIC SPINE WO CONTRAST  Final Result    MR LUMBAR SPINE WO CONTRAST  Final Result      No Follow-up on file.    Merrily Pew, MD 03/06/17 [phone]

## 2017-03-06 NOTE — ED Notes (Signed)
Pt bladder scanned, scanner showed >438 ml.

## 2017-03-06 NOTE — ED Triage Notes (Signed)
Patient reports that yesterday morning she woke up and was paralyzed from waist down. Patient states that she has breast cancer that has spread to bone. Patient thought was just a inched nerve by oncologist instructed patient to come to [facility] for MRI to make sure not cancer spreading.

## 2017-03-06 NOTE — ED Notes (Signed)
Dr.Jared at patient bedside-Monique,RN

## 2017-03-06 NOTE — ED Notes (Signed)
Report called to [facility] ED

## 2017-03-06 NOTE — Progress Notes (Signed)
   03/06/17 1500  Clinical Encounter Type  Visited With Patient  Visit Type Initial;Psychological support;Spiritual support;ED  Referral From Nurse  Consult/Referral To Chaplain  Spiritual Encounters  Spiritual Needs Emotional;Other (Comment);Brochure (Spiritual Care conversation/Advance Directive Information)  Stress Factors  Patient Stress Factors Health changes;Major life changes  Advance Directives (For Healthcare)  Does Patient Have a Medical Advance Directive? No (Patient Given Information)   I visited with the patient per referral from the nurse. The patient had requested information about an Forensic scientist, [place]. I provided the paperwork.  The patient stated that her grandson is having a hard time with the thought of death. She states that whether she is admitted to [facility] or [place] that she will need Spiritual Care support.  We briefly talked about her health changes and concerns.  I provided emotional support and pastoral counseling on the document above. Please, contact Spiritual Care for further assistance.   Chaplain Shanon Ace M.Div., [facility]

## 2017-03-06 NOTE — ED Notes (Signed)
Per EMT patient did not void while on bed pan; pt  Request to be picked up and placed on [facility]; RN declined due to safety hazards for patient and staff; Dr.Jared aware of RN decision; EMT to do bladder scan-Monique,RN

## 2017-03-07 ENCOUNTER — Ambulatory Visit
Admit: 2017-03-07 | Discharge: 2017-03-07 | Disposition: A | Discharge status: Home / Self Care | Payer: Medicare Other | Attending: Physician Assistant | Admitting: Physician Assistant

## 2017-03-07 ENCOUNTER — Other Ambulatory Visit: Payer: Self-pay

## 2017-03-07 ENCOUNTER — Encounter: Payer: Self-pay | Admitting: Radiation Oncology

## 2017-03-07 ENCOUNTER — Encounter (HOSPITAL_COMMUNITY): Payer: Self-pay

## 2017-03-07 DIAGNOSIS — C7951 Secondary malignant neoplasm of bone: Secondary | ICD-10-CM

## 2017-03-07 DIAGNOSIS — G834 Cauda equina syndrome: Secondary | ICD-10-CM

## 2017-03-07 DIAGNOSIS — C50911 Malignant neoplasm of unspecified site of right female breast: Secondary | ICD-10-CM

## 2017-03-07 DIAGNOSIS — Z79810 Long term (current) use of selective estrogen receptor modulators (SERMs): Secondary | ICD-10-CM

## 2017-03-07 DIAGNOSIS — R338 Other retention of urine: Secondary | ICD-10-CM

## 2017-03-07 DIAGNOSIS — C7931 Secondary malignant neoplasm of brain: Secondary | ICD-10-CM

## 2017-03-07 LAB — BASIC METABOLIC PANEL
Anion gap: 3 — ABNORMAL LOW (ref 5–15)
BUN: 8 mg/dL (ref 6–20)
CALCIUM: 7.8 mg/dL — AB (ref 8.9–10.3)
CO2: 24 mmol/L (ref 22–32)
CREATININE: 0.47 mg/dL (ref 0.44–1.00)
Chloride: 112 mmol/L — ABNORMAL HIGH (ref 101–111)
GFR calc Af Amer: >60 mL/min (ref >60)
GFR calc non Af Amer: >60 mL/min (ref >60)
GLUCOSE: 126 mg/dL — AB (ref 65–99)
Potassium: 4.3 mmol/L (ref 3.5–5.1)
Sodium: 139 mmol/L (ref 135–145)

## 2017-03-07 LAB — GLUCOSE, CAPILLARY
Glucose-Capillary: 103 mg/dL — ABNORMAL HIGH (ref 65–99)
Glucose-Capillary: 139 mg/dL — ABNORMAL HIGH (ref 65–99)

## 2017-03-07 LAB — HIV ANTIBODY (ROUTINE TESTING W REFLEX): HIV Screen 4th Generation wRfx: NONREACTIVE

## 2017-03-07 MED ORDER — HYDROMORPHONE HCL 1 MG/ML IJ SOLN
1.0000 mg | Freq: Once | INTRAMUSCULAR | Status: DC
  Filled 2017-03-07: qty 1

## 2017-03-07 MED ORDER — OXYCODONE HCL 5 MG PO TABS
40.0000 mg | ORAL_TABLET | ORAL | Status: DC | PRN
  Administered 2017-03-08 – 2017-03-10 (×10): 40 mg via ORAL
  Filled 2017-03-07 (×10): qty 8

## 2017-03-07 MED ORDER — FENTANYL 100 MCG/HR TD PT72
200.0000 ug | MEDICATED_PATCH | TRANSDERMAL | Status: DC
  Administered 2017-03-07 – 2017-03-08 (×2): 200 ug via TRANSDERMAL
  Filled 2017-03-07 (×2): qty 2

## 2017-03-07 MED ORDER — OXYCODONE HCL 5 MG PO TABS
40.0000 mg | ORAL_TABLET | Freq: Four times a day (QID) | ORAL | Status: DC | PRN
  Administered 2017-03-07: 40 mg via ORAL
  Filled 2017-03-07: qty 8

## 2017-03-07 NOTE — Progress Notes (Signed)
Foley cath anchored without difficulty.  Pt c/o numbness to ble-able to move toes and has gross movement to legs.

## 2017-03-07 NOTE — Progress Notes (Signed)
  PROGRESS NOTE  Ashlee Moore HQR:[id number] DOB: 1960/06/09 DOA: 03/06/2017 PCP: Ashlee Sleeper, PA-C  Brief Narrative: 21yow PMH breast cancer with metastatic disease to T10 presented with weakness and numbness BLE. In ED profound motor and sensory deficits BLE. 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   Assessment/Plan BLE paralysis new onset, thought secondary to intrathecal metastatic disease causing cauda equina. Per NS no surgical options. - Decadron per Radiation Oncology with plan to start radiation today - appreciate Dr. Gearldine Moore recommendations  Metastatic breast cancer - appreciate Dr. Gearldine Moore recommendations  Urinary retention secondary to cauda equina - foley placed  Hypoglycemia  - follow CBG  Mobitz II s/p PPM MRI compatible.   Essential HTN - stable. Continue HCTZ  Fibromyalgia  Chronic pain of malignancy - continue fentanyl patch, oxycodone  DVT prophylaxis: enoxaparin Code Status: full Family Communication: none Disposition Plan: pending    Ashlee Hodgkins, MD  [facility] Direct contact: [phone] --Via amion app OR  --[email or web address]; password [email or web address]  7PM-7AM contact night coverage as above 03/07/2017, 3:18 PM  LOS: 1 day   Consultants:  Radiation oncology  Oncology   Procedures:  XRT  Antimicrobials:    Interval history/Subjective: Feels ok. No pain in legs. Minimal sensation in legs.   Objective: Vitals:  Vitals:   03/07/17 0501 03/07/17 1441  BP: 129/73 103/62  Pulse: 80 97  Resp: 16 16  Temp: 98.7 F (37.1 C) 97.8 F (36.6 C)  SpO2: 100% 100%    Exam:  Constitutional:  . Appears calm and comfortable Eyes:  . pupils and irises appear normal . Normal lids  ENMT:  . grossly normal hearing  . Lips appear normal Respiratory:  . CTA bilaterally, no w/r/r.  . Respiratory effort normal.  Cardiovascular:  . RRR, no m/r/g . No LE extremity edema   Musculoskeletal:   . RLE, LLE   . Strength minimal; able to slightly move legs and flex hips  Neurologic:  . Minimal sensation BLE Psychiatric:  . Mental status o Mood, affect appropriate  I have personally reviewed the following:   Labs:  K+ WNL  Imaging studies:  MRI noted  Scheduled Meds: . calcium-vitamin D  1 tablet Oral Q breakfast  . dexamethasone  10 mg Intravenous Q6H  . dextrose  1 ampule Intravenous Once  . enoxaparin (LOVENOX) injection  40 mg Subcutaneous QHS  . fentaNYL  200 mcg Transdermal Q72H  . multivitamin with minerals  1 tablet Oral QHS  . potassium chloride SA  20 mEq Oral Daily  . pregabalin  100 mg Oral Daily  . tamoxifen  20 mg Oral Daily  . triamterene-hydrochlorothiazide  1 tablet Oral q morning - 10a   Continuous Infusions:  Principal Problem:   Paraplegia (HCC) Active Problems:   Breast cancer metastasized to bone [facility])   Chronic pain due to neoplasm   Metastatic cancer to spine ([place])   Hypoglycemia   Acute urinary retention   LOS: 1 day

## 2017-03-07 NOTE — Progress Notes (Signed)
Admitted to room 1336.

## 2017-03-07 NOTE — Progress Notes (Signed)
Bladder scan > 480, patient unable to urinate.  Foley catheter ordered.

## 2017-03-07 NOTE — Consult Note (Signed)
New Hematology/Oncology Consult   Referral MD: Carlean Jews      Reason for Referral: Breast cancer, cauda equina syndrome  HPI: Ashlee Moore has a history of breast cancer dating to approximately 2010.  She is treated by Dr. Dina Rich in [place].  She reports she most recently was treated with Herceptin approximately 1 month ago. She developed the acute onset of bilateral leg weakness and numbness 03/05/2017.  She reports falling at home.  She also has lower back pain.  She presented to the [facility] emergency room yesterday.  She was noted to have profound bilateral leg weakness.  Multiple low-density lesions were noted in the cauda equina, concerning for intrathecal metastatic disease.  Sclerotic lesions were noted at T10 and L5.  She has noted improvement in leg strength today.  She developed urinary retention yesterday and a Foley catheter was placed.    Past Medical History:  Diagnosis Date  . Anemia   . Arthritis   . AV block, Mobitz 2 11/14/09   s/p MDT Revo PPM by JA  . Bipolar disorder ([place])   . Breast cancer metastasized to bone ([place]) 08/16/2012  . Cancer ([place])    right breast/LAST CHEOM 10/07/11  . Chills   . Chronic pain 12/13/2015  . Easy bruising   . Elevated LFTs    hx  . Fever   . Fibromyalgia   . Generalized headaches   . GERD (gastroesophageal reflux disease)   . Hypertension   . Morbid obesity with body mass index of 40.0-44.9 in adult ([place]) 10/22/2011   BMI 44.26    . Osteoporosis   . Pacemaker   . Sleep apnea    STOP BANG SCORE 4  . Weakness   :  .  G3 P3  Past Surgical History:  Procedure Laterality Date  . BILATERAL KNEE ARTHROSCOPY    . BREAST SURGERY  2012,12   lumpectomy,mastectomy bil  . CHOLECYSTECTOMY    . ECTOPIC PREGNANCY SURGERY     x2  . gastric bypass surgery    . left ovary and fallopian tube removed due to chronic pain    . mastectomy  2013   bilateral  . MULTIPLE EXTRACTIONS WITH ALVEOLOPLASTY N/A 06/04/2012    Procedure: MULTIPLE EXTRACION WITH ALVEOLOPLASTY EXTRACT: 4, 5, 8, 9, 10, 13, 14 ,28;  Surgeon: Gae Bon, DDS;  Location: [facility];  Service: Oral Surgery;  Laterality: N/A;  . PACEMAKER INSERTION  2011  . plastic surgery on face     for ptosis of rt. eyelid  . PORTACATH PLACEMENT     power port - right  . PPM  11/14/09   MDT Revo implanted by Dr Rayann Heman for syncope/ Mobitz II AV block  :   Current Facility-Administered Medications:  .  acetaminophen (TYLENOL) tablet 650 mg, 650 mg, Oral, Q6H PRN **OR** acetaminophen (TYLENOL) suppository 650 mg, 650 mg, Rectal, Q6H PRN, Etta Quill, DO .  calcium-vitamin D (OSCAL WITH D) 500-200 MG-UNIT per tablet 1 tablet, 1 tablet, Oral, Q breakfast, Mesner, Corene Cornea, MD, 1 tablet at 03/07/17 1002 .  clonazePAM (KLONOPIN) tablet 1 mg, 1 mg, Oral, BID PRN, Etta Quill, DO .  dexamethasone (DECADRON) injection 10 mg, 10 mg, Intravenous, Q6H, Alcario Drought, Jared M, DO, 10 mg at 03/06/17 2328 .  dextrose 50 % solution 50 mL, 1 ampule, Intravenous, Once, Davonna Belling, MD .  enoxaparin (LOVENOX) injection 40 mg, 40 mg, Subcutaneous, QHS, Gardner, Jared M, DO .  enoxaparin (LOVENOX) injection  40 mg, 40 mg, Subcutaneous, QHS, Masters, Jake Church, RPH, 40 mg at 03/07/17 1004 .  fentaNYL (DURAGESIC - dosed mcg/hr) patch 200 mcg, 200 mcg, Transdermal, Q72H, Etta Quill, DO .  HYDROmorphone (DILAUDID) injection 1 mg, 1 mg, Intravenous, Once, Mesner, Corene Cornea, MD .  hydroxypropyl methylcellulose / hypromellose (ISOPTO TEARS / GONIOVISC) 2.5 % ophthalmic solution 1 drop, 1 drop, Both Eyes, QID PRN, Etta Quill, DO .  lidocaine-prilocaine (EMLA) cream 1 application, 1 application, Topical, PRN, Etta Quill, DO .  multivitamin with minerals tablet 1 tablet, 1 tablet, Oral, QHS, Etta Quill, DO, 1 tablet at 03/06/17 2328 .  ondansetron (ZOFRAN) tablet 4 mg, 4 mg, Oral, Q6H PRN **OR** ondansetron (ZOFRAN) injection 4 mg, 4 mg, Intravenous, Q6H PRN,  Etta Quill, DO .  oxyCODONE (Oxy IR/ROXICODONE) immediate release tablet 40 mg, 40 mg, Oral, Q8H PRN, Mesner, Jason, MD, 40 mg at 03/07/17 0843 .  potassium chloride SA (K-DUR,KLOR-CON) CR tablet 20 mEq, 20 mEq, Oral, Daily, Jennette Kettle M, DO, 20 mEq at 03/07/17 1002 .  pregabalin (LYRICA) capsule 100 mg, 100 mg, Oral, Daily, Jennette Kettle M, DO, 100 mg at 03/07/17 1002 .  tamoxifen (NOLVADEX) tablet 20 mg, 20 mg, Oral, Daily, Jennette Kettle M, DO, 20 mg at 03/07/17 1001 .  triamterene-hydrochlorothiazide (MAXZIDE-25) 37.5-25 MG per tablet 1 tablet, 1 tablet, Oral, q morning - 10a, Alcario Drought, Jared M, DO, 1 tablet at 03/07/17 1003:  . calcium-vitamin D  1 tablet Oral Q breakfast  . dexamethasone  10 mg Intravenous Q6H  . dextrose  1 ampule Intravenous Once  . enoxaparin (LOVENOX) injection  40 mg Subcutaneous QHS  . enoxaparin (LOVENOX) injection  40 mg Subcutaneous QHS  . fentaNYL  200 mcg Transdermal Q72H  .  HYDROmorphone (DILAUDID) injection  1 mg Intravenous Once  . multivitamin with minerals  1 tablet Oral QHS  . potassium chloride SA  20 mEq Oral Daily  . pregabalin  100 mg Oral Daily  . tamoxifen  20 mg Oral Daily  . triamterene-hydrochlorothiazide  1 tablet Oral q morning - 10a  :  Allergies  Allergen Reactions  . Penicillins Swelling  . Sulfasalazine Rash  . Sulfonamide Derivatives Rash  . Latex     adhesive  . 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  She does not use cigarettes or alcohol.  She lives with her son and grandson in [place].  No transfusion history.  Review of Systems:   Positives include: Bilateral leg weakness and numbness, urinary retention,  low back pain  A complete ROS was otherwise negative.   Physical Exam:  Blood pressure 129/73, pulse 80, temperature 98.7 F (37.1 C), temperature source Oral, resp. rate 16, height _0  (1.626 m), weight 150 lb 9.2 oz (68.3 kg), SpO2 100 %.  HEENT: Edentulous, no thrush Lungs: Clear bilaterally Cardiac: Regular rate and rhythm Abdomen: No hepatosplenomegaly, nontender GU: Foley catheter in place Vascular: Trace lower leg edema bilaterally Lymph nodes: No cervical, supraclavicular, axillary, or inguinal nodes Neurologic: Alert and oriented, 1-2/5 strength with plantar flexion at the feet and hip extension, 1/5 strength with dorsiflexion at the feet, numb below the umbilicus Skin:  Radiation telangiectasias at the right anterior chest wall Musculoskeletal: Mild tenderness at the lower back  LABS:  Recent Labs    03/06/17 1524 03/06/17 1532  WBC 6.0  --   HGB 10.6* 12.6  HCT 35.0* 37.0  PLT 238  --     Recent Labs    03/06/17 1532 03/07/17 0815  NA 145 139  K 3.0* 4.3  CL 106 112*  CO2  --  24  GLUCOSE 30* 126*  BUN 8 8  CREATININE 0.50 0.47  CALCIUM  --  7.8*      RADIOLOGY:  Mr Thoracic Spine Wo Contrast  Result Date: 03/06/2017 CLINICAL DATA:  Osseous metastatic disease.  Breast cancer EXAM: MRI THORACIC AND LUMBAR SPINE WITHOUT CONTRAST TECHNIQUE: Multiplanar and multiecho pulse sequences of the thoracic and lumbar spine were obtained without intravenous contrast. COMPARISON:  PET CT 11/14/2016 FINDINGS: MRI THORACIC SPINE FINDINGS Alignment:  Physiologic. Vertebrae: There is a low T1/T2-weighted signal lesion of the T10 vertebral body that extends into both pedicles and the adjacent ribs. There are no other low T1-weighted signal osseous lesions to suggest other metastatic deposits. Cord: Area of hyperintense T2-weighted signal within the ventral cord at the T4 level and within the central spinal cord at the T11 level. There is associated volume loss at the T4  level. Paraspinal and other soft tissues: Unremarkable Disc levels: There are small disc protrusions at T3-T4, T4-T5 and T12-L1 without central spinal canal stenosis. There is narrowing of the left lateral recess at T12-L1. MRI LUMBAR SPINE FINDINGS Segmentation:  Standard. Alignment:  Grade 1 anterolisthesis at L4-L5. Vertebrae: Sclerotic lesion of L5. Otherwise normal marrow. No acute fracture. No discitis-osteomyelitis. L1 hemangioma. Conus medullaris and cauda equina: Conus extends to the L1 level. There are multiple low signal lesions within the cauda equina (series 6 images 2, 4, 8, 9, 15). Paraspinal and other soft tissues: Negative. Disc levels: L1-L2: Normal L2-L3: Mild facet hypertrophy.  Normal disc.  No stenosis. L3-L4: No disc herniation or spinal canal stenosis. Moderate facet hypertrophy without foraminal stenosis. L4-L5: Severe bilateral facet hypertrophy with grade 1 anterolisthesis. Small pseudo disc bulge without stenosis. L5-S1: Mild disc bulge and moderate facet hypertrophy.  No stenosis. IMPRESSION: MR THORACIC SPINE IMPRESSION 1. Sclerotic lesion of the T10 vertebral body corresponds to the hypermetabolic lesion seen on the PET CT of 11/14/2016 and is consistent with osseous metastatic disease. 2. No other thoracic spine osseous metastases. 3. Multifocal hyperintense T2-weighted signal within the thoracic spinal cord, at the T4 and T11 levels. This most likely indicates chronic myelomalacia. MR LUMBAR SPINE IMPRESSION 1. Multifocal nodular signal abnormalities of the upper cauda equina. This is concerning for intrathecal metastatic disease. Postcontrast MRI is recommended for further characterization. 2. Sclerotic lesion of the L5 vertebral body corresponds to non-hypermetabolic sclerotic focus on the PET CT of 11/14/2016. 3. Severe L4-L5 facet arthrosis with resultant grade 1 anterolisthesis. Electronically Signed   By: Ulyses Jarred M.D.   On: 03/06/2017 18:29   Mr Lumbar Spine Wo  Contrast  Result Date: 03/06/2017 CLINICAL DATA:  Osseous metastatic disease.  Breast cancer EXAM: MRI THORACIC AND LUMBAR SPINE WITHOUT CONTRAST TECHNIQUE: Multiplanar and multiecho pulse sequences of the thoracic and lumbar spine were obtained without intravenous contrast. COMPARISON:  PET CT 11/14/2016 FINDINGS: MRI THORACIC SPINE FINDINGS Alignment:  Physiologic. Vertebrae: There is a low T1/T2-weighted signal lesion of the T10 vertebral body that extends into both pedicles and the adjacent ribs. There are no other low T1-weighted signal osseous  lesions to suggest other metastatic deposits. Cord: Area of hyperintense T2-weighted signal within the ventral cord at the T4 level and within the central spinal cord at the T11 level. There is associated volume loss at the T4 level. Paraspinal and other soft tissues: Unremarkable Disc levels: There are small disc protrusions at T3-T4, T4-T5 and T12-L1 without central spinal canal stenosis. There is narrowing of the left lateral recess at T12-L1. MRI LUMBAR SPINE FINDINGS Segmentation:  Standard. Alignment:  Grade 1 anterolisthesis at L4-L5. Vertebrae: Sclerotic lesion of L5. Otherwise normal marrow. No acute fracture. No discitis-osteomyelitis. L1 hemangioma. Conus medullaris and cauda equina: Conus extends to the L1 level. There are multiple low signal lesions within the cauda equina (series 6 images 2, 4, 8, 9, 15). Paraspinal and other soft tissues: Negative. Disc levels: L1-L2: Normal L2-L3: Mild facet hypertrophy.  Normal disc.  No stenosis. L3-L4: No disc herniation or spinal canal stenosis. Moderate facet hypertrophy without foraminal stenosis. L4-L5: Severe bilateral facet hypertrophy with grade 1 anterolisthesis. Small pseudo disc bulge without stenosis. L5-S1: Mild disc bulge and moderate facet hypertrophy.  No stenosis. IMPRESSION: MR THORACIC SPINE IMPRESSION 1. Sclerotic lesion of the T10 vertebral body corresponds to the hypermetabolic lesion seen on the  PET CT of 11/14/2016 and is consistent with osseous metastatic disease. 2. No other thoracic spine osseous metastases. 3. Multifocal hyperintense T2-weighted signal within the thoracic spinal cord, at the T4 and T11 levels. This most likely indicates chronic myelomalacia. MR LUMBAR SPINE IMPRESSION 1. Multifocal nodular signal abnormalities of the upper cauda equina. This is concerning for intrathecal metastatic disease. Postcontrast MRI is recommended for further characterization. 2. Sclerotic lesion of the L5 vertebral body corresponds to non-hypermetabolic sclerotic focus on the PET CT of 11/14/2016. 3. Severe L4-L5 facet arthrosis with resultant grade 1 anterolisthesis. Electronically Signed   By: Ulyses Jarred M.D.   On: 03/06/2017 18:29    Assessment and Plan:   1.  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  Bilateral lower extremity weakness/numbness and urinary retention-likely secondary to metastatic disease involving the cauda equina  Decadron initiated 03/07/2017  Palliative radiation beginning 03/07/2017  3.  Pain secondary to metastatic breast cancer involving the bones  4.  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  She appears to have experienced some improvement with Decadron overnight.  She will begin palliative radiation today.  She has been treated with multiple systemic therapies for breast cancer.  She is currently maintained on tamoxifen.  We do not have the most recent records from Dr. Dina Rich available today.  I will attempt to contact him to discuss the case.  She may be a candidate for other hormonal therapies and HER-2 directed therapy.  Recommendations: 1.  Continue Decadron 2.  Palliative radiation to the  lumbosacral spine 3.  Increase out of bed as tolerated 4.  PT/OT evaluation   Oncology will continue following her while in the hospital.  Betsy Coder, MD 03/07/2017, 11:11 AM

## 2017-03-07 NOTE — Progress Notes (Signed)
Radiation Oncology         [phone] ________________________________  Initial Inpatient Consultation  Name: Ashlee Moore MRN: [id number]  Date: 03/07/2017  DOB: [birth year]  WH:QPRFF, Londell Moh, PA-C  No ref. provider found   REFERRING PHYSICIAN: Etta Quill, DO  DIAGNOSIS: Metastatic breast cancer with new onset lower extremity weakness and sensory deficits  HISTORY OF PRESENT ILLNESS::Ashlee Moore is a 57 y.o. female who is seen on an urgent basis after she presented to the emergency room on Friday with bilateral lower extremity paralysis. Patient awoke Thursday morning and was unable to stand or move her legs well. She felt this may improve but did not and presented to the emergency room from home after she discussed her situation with Dr Jacquiline Doe her medical oncologist in [place], [place]. Yesterday evening the patient was transferred to [facility] where she underwent a MRI compatible with her pacemaker. This showed a sclerotic lesion at T10 consistent with prior hypermetabolic lesion on PET scan in October of last year consistent with metastatic disease. Patient was also noted to have a focal hyperintense signal at T4 and T11 consistent with chronic myelomalacia. Of particular note was multifocal nodular signal abnormalities in the upper cauda equina concerning for intrathecal metastatic disease. The patient was unable to complete a postcontrast MRI due to back pain. Neurosurgery was consulted and given the location of the lesions, intrathecal, she was not felt to be a candidate for surgical intervention. Radiation therapy is been consulted for consideration for urgent treatment in light of the patient's new onset lower extremity weakness and numbness.  PREVIOUS RADIATION THERAPY: Yes , the patient reports having radiation therapy to the right chest wall several years ago at the [facility] in [place]. She reports her radiation oncologist was Dr. Tammi Klippel      Breast cancer metastasized to bone [facility])   01/01/2011 Procedure    Partial mastectomy and sentinel node biopsy      01/01/2011 Pathology Results    Poorly differentiated invasive ductal carcinoma, 2.0 cm with +LVI, measuring 2.0 cm.  0.1 cm of closest margin (deep) with positive margin of the superior and medial aspects with DCIS.  ER 80%, PR 75%, Ki-67 43%, HER2 3+ POSITIVE.      10/20/2011 Procedure    Right mastectomy and prophylactic left simple mastectomy by Dr. Margot Chimes.      10/20/2011 Pathology Results    Right invasive ductal carcinoma measuring 19.2 cm, grade II with angiolymphatic invasion and perineural invasion with negative resection margins.  Right axillary dissection shows 2/4 lymph nodes positive for disease with extracapsular extension.  ER 70%, PR 0%, HER2+ by [facility].      03/29/2012 PET scan    1.  Postoperative uptake in the anterior right chest wall without evidence of metastatic disease. 2.  Mildly hypermetabolic left level II lymph nodes may be reactive.      05/28/2012 Imaging    MR L-spine-  New abnormal L5 and S1/S2 bone lesions most compatible with metastatic disease to bone in this setting.      06/11/2012 Procedure    L5 and S1 bone biopsy by Dr. Estanislado Pandy with kyphoplasty      06/11/2012 Pathology Results    L5 bone biopsy is positive for metastatic carcinoma, ER+, HER2 POSITIVE.  S1 biopsy is negative for malignancy      08/16/2012 Initial Diagnosis    Breast cancer metastasized to bone ([place])      09/15/2012 Imaging  MRI L-Spine- T10 vertebral body lesion measuring approximately 2 x 2.4 cm with hypermetabolic activity on recent PET-CT, most consistent with metastatic disease.      11/12/2012 PET scan    Sclerotic osseous metastases at T10-11, L5, and left sacrum/pelvis, grossly unchanged, max SUV 5.4.  Interval vertebral augmentation at L5 with left sacroplasty.  No evidence of new/progressive metastatic disease in the chest      07/20/2013  PET scan    1. Similar to slight progression of osseous metastasis. 2. Lingular patchy pulmonary opacity and hypermetabolism. Favored to represent an area of mild infection. 3. Right upper lobe subpleural ground-glass opacity and hypermetabolism. If the patient has had radiation in this area, evolving radiation change could have this appearance. A second focus of infection is felt slightly less likely. Not typical of metastatic disease. 4. Hypermetabolic right thyroid nodule. Given comorbidities, of questionable clinical significance. Recommend attention on follow-up.      09/23/2013 Procedure    T10 bone biopsy by Dr. Estanislado Pandy      09/23/2013 Pathology Results    T10 bone is positive for metastatic carcinoma ER MOSTLY NEGATIVE.      09/27/2013 Procedure    Status post radiofrequency ablation for tumor ablation at T10 followed by vertebral body augmentation by Dr. Estanislado Pandy      01/26/2014 Imaging    MRI T and L-spine- 1. Prior vertebral augmentation T10 and L5 and prior left sacroplasty. No evidence of new thoracic or lumbar osseous metastases or epidural tumor. 2. Lesions in the sternum and left iliac wing as described on prior PET-CT. 3. Mild thoracic and lumbar spondylosis without spinal stenosis. Moderate to severe lower lumbar facet arthrosis.      03/28/2014 Imaging    MRI brain- Negative for intracranial metastasis or other explanation for headache.      07/31/2014 PET scan    Status post bilateral mastectomy with right axillary lymph node dissection.  10 mm short axis right superior mediastinal nodal metastasis, new.  Mild progression of multifocal osseous metastases.      04/04/2015 PET scan    Status post bilateral mastectomy with right axillary lymph node dissection.  12 mm short axis right superior mediastinal nodal metastasis, mildly increased in size, but with decreased hypermetabolism.  Multifocal osseous metastases, overall with overall mildly  decreased hypermetabolism.  No evidence of new/progressive metastatic disease.      04/09/2016 PET scan    1. Bony metastatic lesions are reduced in hypermetabolic activity compared to the prior exam, but not resolved. 2. The hypermetabolic upper mediastinal prevascular node is essentially stable in size and mildly increased in activity compared to the prior exam, SUV increase from 6.4 previously to 7.2 today. 3. No new metastatic lesions are identified. 4. Other imaging findings of potential clinical significance: Biliary dilatation, chronic. Postoperative findings in the stomach. Right lumbar hernia containing adipose tissue. Fullness of the right collecting system without a stone identified.      09/19/2016 Imaging    CT C/A/P IMPRESSION: CT CHEST  1. Enlarging bilateral pulmonary nodules concerning for progressive metastatic disease. 2. Stable osseous metastatic disease without significant interval change. 3. Healing pathologic fractures.  No new fracture identified. 4. Additional ancillary findings as above without significant interval change.  CT ABD/PELVIS  1. Fatty, minimally complex soft tissue mass in the right posterolateral abdominal wall musculature centered in the external oblique muscle measures 5.4 x 5.1 cm. While this lesion demonstrates very little interval change compared to prior imaging from earlier this  year, there has been definite enlargement compared to more remote prior studies from 2014. While this may simply represent a lipoma, the mild internal complexity raises the possibility of a low grade liposarcoma. Additionally, this may correlate with the site of the patient's reported right/lower back pain. This region would be amenable to percutaneous biopsy if deemed clinically warranted. 2. Stable osseous metastatic disease with evidence of prior cement augmentation in the left sacral ala and L5 vertebral body. 3. Stable intra and extrahepatic  biliary ductal dilatation. 4. Additional ancillary findings as above without significant interval change.       09/19/2016 Echocardiogram    EF 60-65%       PAST MEDICAL HISTORY:  has a past medical history of Anemia, Arthritis, AV block, Mobitz 2 (11/14/09), Bipolar disorder ([place]), Breast cancer metastasized to bone ([place]) (08/16/2012), Cancer ([place]), Chills, Chronic pain (12/13/2015), Easy bruising, Elevated LFTs, Fever, Fibromyalgia, Generalized headaches, GERD (gastroesophageal reflux disease), Hypertension, Morbid obesity with body mass index of 40.0-44.9 in adult [facility]) (10/22/2011), Osteoporosis, Pacemaker, Sleep apnea, and Weakness.    PAST SURGICAL HISTORY: Past Surgical History:  Procedure Laterality Date  . BILATERAL KNEE ARTHROSCOPY    . BREAST SURGERY  2012,12   lumpectomy,mastectomy bil  . CHOLECYSTECTOMY    . ECTOPIC PREGNANCY SURGERY     x2  . gastric bypass surgery    . left ovary and fallopian tube removed due to chronic pain    . mastectomy  2013   bilateral  . MULTIPLE EXTRACTIONS WITH ALVEOLOPLASTY N/A 06/04/2012   Procedure: MULTIPLE EXTRACION WITH ALVEOLOPLASTY EXTRACT: 4, 5, 8, 9, 10, 13, 14 ,28;  Surgeon: Gae Bon, DDS;  Location: [facility];  Service: Oral Surgery;  Laterality: N/A;  . PACEMAKER INSERTION  2011  . plastic surgery on face     for ptosis of rt. eyelid  . PORTACATH PLACEMENT     power port - right  . PPM  11/14/09   MDT Revo implanted by Dr Rayann Heman for syncope/ Mobitz II AV block    FAMILY HISTORY: family history includes Brain cancer in her sister; Breast cancer in her sister; COPD in her mother; Cancer in her brother, other, and sister; Diabetes in her paternal grandmother; Heart attack in her father; Heart disease in her father; Hypertension in her father.  SOCIAL HISTORY:  reports that she quit smoking about 32 years ago. Her smoking use included cigarettes. She has a 2.40 pack-year smoking history. she has never used smokeless tobacco. She  reports that she does not drink alcohol or use drugs.  ALLERGIES: Penicillins; Sulfasalazine; Sulfonamide derivatives; Latex; and Tape  MEDICATIONS:  No current facility-administered medications for this visit.    No current outpatient medications on file.   Facility-Administered Medications Ordered in Other Visits  Medication Dose Route Frequency Provider Last Rate Last Dose  . acetaminophen (TYLENOL) tablet 650 mg  650 mg Oral Q6H PRN Etta Quill, DO       Or  . acetaminophen (TYLENOL) suppository 650 mg  650 mg Rectal Q6H PRN Etta Quill, DO      . calcium-vitamin D (OSCAL WITH D) 500-200 MG-UNIT per tablet 1 tablet  1 tablet Oral Q breakfast Mesner, Corene Cornea, MD   1 tablet at 03/07/17 1002  . clonazePAM (KLONOPIN) tablet 1 mg  1 mg Oral BID PRN Etta Quill, DO      . dexamethasone (DECADRON) injection 10 mg  10 mg Intravenous Q6H Gardner, Jared M, DO   10 mg  at 03/06/17 2328  . dextrose 50 % solution 50 mL  1 ampule Intravenous Once Davonna Belling, MD      . enoxaparin (LOVENOX) injection 40 mg  40 mg Subcutaneous QHS Jennette Kettle M, DO      . enoxaparin (LOVENOX) injection 40 mg  40 mg Subcutaneous QHS Harvel Quale, RPH   40 mg at 03/07/17 1004  . fentaNYL (DURAGESIC - dosed mcg/hr) patch 200 mcg  200 mcg Transdermal Q72H Etta Quill, DO      . HYDROmorphone (DILAUDID) injection 1 mg  1 mg Intravenous Once Mesner, Corene Cornea, MD      . hydroxypropyl methylcellulose / hypromellose (ISOPTO TEARS / GONIOVISC) 2.5 % ophthalmic solution 1 drop  1 drop Both Eyes QID PRN Etta Quill, DO      . lidocaine-prilocaine (EMLA) cream 1 application  1 application Topical PRN Etta Quill, DO      . multivitamin with minerals tablet 1 tablet  1 tablet Oral QHS Etta Quill, DO   1 tablet at 03/06/17 2328  . ondansetron (ZOFRAN) tablet 4 mg  4 mg Oral Q6H PRN Etta Quill, DO       Or  . ondansetron [facility]) injection 4 mg  4 mg Intravenous Q6H PRN Etta Quill, DO      . oxyCODONE (Oxy IR/ROXICODONE) immediate release tablet 40 mg  40 mg Oral Q8H PRN Mesner, Corene Cornea, MD   40 mg at 03/07/17 0843  . potassium chloride SA (K-DUR,KLOR-CON) CR tablet 20 mEq  20 mEq Oral Daily Jennette Kettle M, DO   20 mEq at 03/07/17 1002  . pregabalin (LYRICA) capsule 100 mg  100 mg Oral Daily Jennette Kettle M, DO   100 mg at 03/07/17 1002  . tamoxifen (NOLVADEX) tablet 20 mg  20 mg Oral Daily Jennette Kettle M, DO   20 mg at 03/07/17 1001  . triamterene-hydrochlorothiazide (MAXZIDE-25) 37.5-25 MG per tablet 1 tablet  1 tablet Oral q morning - 10a Etta Quill, DO   1 tablet at 03/07/17 1003    REVIEW OF SYSTEMS: REVIEW OF SYSTEMS: A 10+ POINT REVIEW OF SYSTEMS WAS OBTAINED including neurology, dermatology, psychiatry, cardiac, respiratory, lymph, extremities, GI, GU, musculoskeletal, constitutional, reproductive, HEENT. All pertinent positives are noted in the HPI. All others are negative. She does report mid to lower back pain which is been a chronic issue for her.   PHYSICAL EXAM:  Vitals - 1 value per visit 09/19/1749  SYSTOLIC 025  DIASTOLIC 73  Pulse 80  Temperature 98.7  Respirations 16  Weight (lb) 150.57  Height '5\' 4"'$   BMI 25.85  VISIT REPORT   General: Alert and oriented, in no acute distress, lying comfortably in her hospital bed HEENT: Head is normocephalic. Extraocular movements are intact.  Neck: Neck is supple, no palpable cervical or supraclavicular lymphadenopathy. Heart: Regular in rate and rhythm with no murmurs, rubs, or gallops. Pacemaker in place in the left upper chest Chest: Clear to auscultation bilaterally, with no rhonchi, wheezes, or rales. Patient has had bilateral mastectomies. Patient has telangiectasias along her right mastectomy scar consistent with previous radiation therapy Abdomen: Soft, nontender, nondistended, with no rigidity or guarding. Extremities: No cyanosis or edema. Lymphatics: see Neck Exam Skin: No concerning  lesions. Musculoskeletal: symmetric strength and muscle tone throughout. Neurologic:  Speech is fluent. Coordination is intact. Patient reports being numb from approximately 2-3 cm above the umbilicus downward. Patient has minimal dorsiflexion and plantar flexion of both feet.  She is unable to move or raise her legs off the hospital bed. No sensation appreciated in the lower extremities with touch. Psychiatric: Judgment and insight are intact. Affect is appropriate. Foley catheter in place in light of urinary retention issues   ECOG = 4  0 - Asymptomatic (Fully active, able to carry on all predisease activities without restriction)  1 - Symptomatic but completely ambulatory (Restricted in physically strenuous activity but ambulatory and able to carry out work of a light or sedentary nature. For example, light housework, office work)  2 - Symptomatic, <50% in bed during the day (Ambulatory and capable of all self care but unable to carry out any work activities. Up and about more than 50% of waking hours)  3 - Symptomatic, >50% in bed, but not bedbound (Capable of only limited self-care, confined to bed or chair 50% or more of waking hours)  4 - Bedbound (Completely disabled. Cannot carry on any self-care. Totally confined to bed or chair)  5 - Death   Eustace Pen MM, Creech RH, Tormey DC, et al. [phone]). "Toxicity and response criteria of the [facility]". [place] Oncol. 5 (6): 649-55  LABORATORY DATA:  Lab Results  Component Value Date   WBC 6.0 03/06/2017   HGB 12.6 03/06/2017   HCT 37.0 03/06/2017   MCV 79.7 03/06/2017   PLT 238 03/06/2017   NEUTROABS 4.1 03/06/2017   Lab Results  Component Value Date   NA 145 03/06/2017   K 3.0 (L) 03/06/2017   CL 106 03/06/2017   CO2 31 10/01/2016   GLUCOSE 30 (LL) 03/06/2017   CREATININE 0.50 03/06/2017   CALCIUM 9.6 10/01/2016      RADIOGRAPHY: Mr Thoracic Spine Wo Contrast  Result Date: 03/06/2017 CLINICAL  DATA:  Osseous metastatic disease.  Breast cancer EXAM: MRI THORACIC AND LUMBAR SPINE WITHOUT CONTRAST TECHNIQUE: Multiplanar and multiecho pulse sequences of the thoracic and lumbar spine were obtained without intravenous contrast. COMPARISON:  PET CT 11/14/2016 FINDINGS: MRI THORACIC SPINE FINDINGS Alignment:  Physiologic. Vertebrae: There is a low T1/T2-weighted signal lesion of the T10 vertebral body that extends into both pedicles and the adjacent ribs. There are no other low T1-weighted signal osseous lesions to suggest other metastatic deposits. Cord: Area of hyperintense T2-weighted signal within the ventral cord at the T4 level and within the central spinal cord at the T11 level. There is associated volume loss at the T4 level. Paraspinal and other soft tissues: Unremarkable Disc levels: There are small disc protrusions at T3-T4, T4-T5 and T12-L1 without central spinal canal stenosis. There is narrowing of the left lateral recess at T12-L1. MRI LUMBAR SPINE FINDINGS Segmentation:  Standard. Alignment:  Grade 1 anterolisthesis at L4-L5. Vertebrae: Sclerotic lesion of L5. Otherwise normal marrow. No acute fracture. No discitis-osteomyelitis. L1 hemangioma. Conus medullaris and cauda equina: Conus extends to the L1 level. There are multiple low signal lesions within the cauda equina (series 6 images 2, 4, 8, 9, 15). Paraspinal and other soft tissues: Negative. Disc levels: L1-L2: Normal L2-L3: Mild facet hypertrophy.  Normal disc.  No stenosis. L3-L4: No disc herniation or spinal canal stenosis. Moderate facet hypertrophy without foraminal stenosis. L4-L5: Severe bilateral facet hypertrophy with grade 1 anterolisthesis. Small pseudo disc bulge without stenosis. L5-S1: Mild disc bulge and moderate facet hypertrophy.  No stenosis. IMPRESSION: MR THORACIC SPINE IMPRESSION 1. Sclerotic lesion of the T10 vertebral body corresponds to the hypermetabolic lesion seen on the PET CT of 11/14/2016 and is consistent with  osseous metastatic disease. 2. No other thoracic spine osseous metastases. 3. Multifocal hyperintense T2-weighted signal within the thoracic spinal cord, at the T4 and T11 levels. This most likely indicates chronic myelomalacia. MR LUMBAR SPINE IMPRESSION 1. Multifocal nodular signal abnormalities of the upper cauda equina. This is concerning for intrathecal metastatic disease. Postcontrast MRI is recommended for further characterization. 2. Sclerotic lesion of the L5 vertebral body corresponds to non-hypermetabolic sclerotic focus on the PET CT of 11/14/2016. 3. Severe L4-L5 facet arthrosis with resultant grade 1 anterolisthesis. Electronically Signed   By: Ulyses Jarred M.D.   On: 03/06/2017 18:29   Mr Lumbar Spine Wo Contrast  Result Date: 03/06/2017 CLINICAL DATA:  Osseous metastatic disease.  Breast cancer EXAM: MRI THORACIC AND LUMBAR SPINE WITHOUT CONTRAST TECHNIQUE: Multiplanar and multiecho pulse sequences of the thoracic and lumbar spine were obtained without intravenous contrast. COMPARISON:  PET CT 11/14/2016 FINDINGS: MRI THORACIC SPINE FINDINGS Alignment:  Physiologic. Vertebrae: There is a low T1/T2-weighted signal lesion of the T10 vertebral body that extends into both pedicles and the adjacent ribs. There are no other low T1-weighted signal osseous lesions to suggest other metastatic deposits. Cord: Area of hyperintense T2-weighted signal within the ventral cord at the T4 level and within the central spinal cord at the T11 level. There is associated volume loss at the T4 level. Paraspinal and other soft tissues: Unremarkable Disc levels: There are small disc protrusions at T3-T4, T4-T5 and T12-L1 without central spinal canal stenosis. There is narrowing of the left lateral recess at T12-L1. MRI LUMBAR SPINE FINDINGS Segmentation:  Standard. Alignment:  Grade 1 anterolisthesis at L4-L5. Vertebrae: Sclerotic lesion of L5. Otherwise normal marrow. No acute fracture. No discitis-osteomyelitis. L1  hemangioma. Conus medullaris and cauda equina: Conus extends to the L1 level. There are multiple low signal lesions within the cauda equina (series 6 images 2, 4, 8, 9, 15). Paraspinal and other soft tissues: Negative. Disc levels: L1-L2: Normal L2-L3: Mild facet hypertrophy.  Normal disc.  No stenosis. L3-L4: No disc herniation or spinal canal stenosis. Moderate facet hypertrophy without foraminal stenosis. L4-L5: Severe bilateral facet hypertrophy with grade 1 anterolisthesis. Small pseudo disc bulge without stenosis. L5-S1: Mild disc bulge and moderate facet hypertrophy.  No stenosis. IMPRESSION: MR THORACIC SPINE IMPRESSION 1. Sclerotic lesion of the T10 vertebral body corresponds to the hypermetabolic lesion seen on the PET CT of 11/14/2016 and is consistent with osseous metastatic disease. 2. No other thoracic spine osseous metastases. 3. Multifocal hyperintense T2-weighted signal within the thoracic spinal cord, at the T4 and T11 levels. This most likely indicates chronic myelomalacia. MR LUMBAR SPINE IMPRESSION 1. Multifocal nodular signal abnormalities of the upper cauda equina. This is concerning for intrathecal metastatic disease. Postcontrast MRI is recommended for further characterization. 2. Sclerotic lesion of the L5 vertebral body corresponds to non-hypermetabolic sclerotic focus on the PET CT of 11/14/2016. 3. Severe L4-L5 facet arthrosis with resultant grade 1 anterolisthesis. Electronically Signed   By: Ulyses Jarred M.D.   On: 03/06/2017 18:29      IMPRESSION: Metastatic breast cancer with recent onset of lower extremity weakness and numbness. The patient will be a candidate for emergent radiation therapy over the weekend to address these new neurologic findings area.  I discussed the course of treatment side effects and potential toxicities of radiation therapy in this situation with the patient. She appears to understand wishes to proceed with planned course of treatment.  The patient's  sensory level was more consistent with disease that T10 but a  lot of the changes at this level are felt to be chronic on MRI. In all likelihood her symptoms are coming from her multifocal nodular soft tissue densities within the upper cauda equina consistent with intrathecal metastatic disease. I reviewed patient's MRI in radiology this morning and most of the nodular densities are located from the mid L1 through the lower part of the L3. The patient's radiation fields will encompass T9 through at least L4.  PLAN: Patient will brought down from the hospital this morning for urgent treatment. Anticipate 10 treatments directed at the lower thoracic and lumbar spine area. Will plan for additional treatment on Sunday.  ------------------------------------------------  Blair Promise, PhD, MD

## 2017-03-08 ENCOUNTER — Other Ambulatory Visit: Payer: Self-pay

## 2017-03-08 ENCOUNTER — Ambulatory Visit: Admit: 2017-03-08 | Discharge: 2017-03-08 | Disposition: A | Discharge status: Home / Self Care | Payer: Medicare Other

## 2017-03-08 LAB — GLUCOSE, CAPILLARY
GLUCOSE-CAPILLARY: 179 mg/dL — AB (ref 65–99)
GLUCOSE-CAPILLARY: 148 mg/dL — AB (ref 65–99)
Glucose-Capillary: 313 mg/dL — ABNORMAL HIGH (ref 65–99)
Glucose-Capillary: 144 mg/dL — ABNORMAL HIGH (ref 65–99)

## 2017-03-08 MED ORDER — INSULIN ASPART 100 UNIT/ML ~~LOC~~ SOLN
0.0000 [IU] | Freq: Three times a day (TID) | SUBCUTANEOUS | Status: DC
  Administered 2017-03-09: 2 [IU] via SUBCUTANEOUS

## 2017-03-08 MED ORDER — INSULIN ASPART 100 UNIT/ML ~~LOC~~ SOLN: 0.0000 [IU] | Freq: Every day | SUBCUTANEOUS | Status: DC

## 2017-03-08 NOTE — Progress Notes (Signed)
Patient's port a cath re accessed due to no blood return, still with out blood return. Per patient it is not unusual for her PAC to not have blood return. Will continue to monitor.

## 2017-03-08 NOTE — Progress Notes (Signed)
  PROGRESS NOTE  Ashlee Moore IWO:[id number] DOB: 06/03/1960 DOA: 03/06/2017 PCP: Terald Sleeper, PA-C  Brief Narrative: 55yow PMH breast cancer with metastatic disease to T10 presented with weakness and numbness BLE. In ED profound motor and sensory deficits BLE. 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   Assessment/Plan BLE paralysis new onset, thought secondary to intrathecal metastatic disease causing cauda equina. Per NS no surgical options. - much improved today s/p XRT 1/26. Now can move legs and flex knees - continue XRT, steroids per radiation oncology, oncology  Metastatic breast cancer - per oncology  Urinary retention secondary to cauda equina - can likely d/c foley 1/28 and proceed with voiding trial  Hypoglycemia  - resolved; now hyperglycemic on steroids - add SSI  Mobitz II s/p PPM MRI compatible.   Essential HTN - stable  Fibromyalgia  Chronic pain of malignancy - stable; continue fentanyl patch, oxycodone 1/27  DVT prophylaxis: enoxaparin Code Status: full Family Communication: none Disposition Plan: likely home   [name], MD  [facility] Direct contact: [phone] --[address]  --[email or web address]; password [email or web address]  7PM-7AM contact night coverage as above 03/08/2017, 10:52 AM  LOS: 2 days   Consultants:  Radiation oncology  Oncology   Procedures:  XRT  Antimicrobials:    Interval history/Subjective: Amazingly better; now has BLE sensation and is able to move both legs.  Objective: Vitals:  Vitals:   03/07/17 1441 03/07/17 2007  BP: 103/62 111/61  Pulse: 97 94  Resp: 16 14  Temp: 97.8 F (36.6 C) 98.1 F (36.7 C)  SpO2: 100% 99%    Exam:  Constitutional:   . Appears calm and comfortable Respiratory:  . CTA bilaterally, no w/r/r.  . Respiratory effort normal.  Cardiovascular:  . RRR, no m/r/g . No LE extremity edema   Musculoskeletal:  . RLE, LLE   Strength  improved, able to move both and flex both knees Neurologic:  . Sensation improved in LE Psychiatric:  . Mental status o Mood, affect appropriate   I have personally reviewed the following:   Labs:  CBG stable   Imaging studies:    Scheduled Meds: . calcium-vitamin D  1 tablet Oral Q breakfast  . dexamethasone  10 mg Intravenous Q6H  . dextrose  1 ampule Intravenous Once  . enoxaparin (LOVENOX) injection  40 mg Subcutaneous QHS  . fentaNYL  200 mcg Transdermal Q72H  . multivitamin with minerals  1 tablet Oral QHS  . potassium chloride SA  20 mEq Oral Daily  . pregabalin  100 mg Oral Daily  . tamoxifen  20 mg Oral Daily  . triamterene-hydrochlorothiazide  1 tablet Oral q morning - 10a   Continuous Infusions:  Principal Problem:   Paraplegia (HCC) Active Problems:   Breast cancer metastasized to bone [facility])   Chronic pain due to neoplasm   Metastatic cancer to spine ([place])   Hypoglycemia   Acute urinary retention   LOS: 2 days

## 2017-03-08 NOTE — Progress Notes (Signed)
IP PROGRESS NOTE  Subjective:   She reports improvement in leg/foot strength and numbness.  She continues to have back pain.  Objective: Vital signs in last 24 hours: Blood pressure 111/61, pulse 94, temperature 98.1 F (36.7 C), temperature source Oral, resp. rate 14, height '5\' 4"'$  (1.626 m), weight 150 lb 9.2 oz (68.3 kg), SpO2 99 %.  Intake/Output from previous day: 01/26 0701 - 01/27 0700 In: 840 [P.O.:840] Out: 5850 [Urine:5850]  Physical Exam:  HEENT: No thrush Neurologic: 4/5 strength with plantar and dorsiflexion at the feet, she is able to bend both knees, light touch intact at the legs bilaterally, numbness persist at the low abdomen   Portacath/PICC-without erythema  Lab Results: Recent Labs    03/06/17 1524 03/06/17 1532  WBC 6.0  --   HGB 10.6* 12.6  HCT 35.0* 37.0  PLT 238  --     BMET Recent Labs    03/06/17 1532 03/07/17 0815  NA 145 139  K 3.0* 4.3  CL 106 112*  CO2  --  24  GLUCOSE 30* 126*  BUN 8 8  CREATININE 0.50 0.47  CALCIUM  --  7.8*    No results found for: CEA1  Studies/Results: Mr Thoracic Spine Wo Contrast  Result Date: 03/06/2017 CLINICAL DATA:  Osseous metastatic disease.  Breast cancer EXAM: MRI THORACIC AND LUMBAR SPINE WITHOUT CONTRAST TECHNIQUE: Multiplanar and multiecho pulse sequences of the thoracic and lumbar spine were obtained without intravenous contrast. COMPARISON:  PET CT 11/14/2016 FINDINGS: MRI THORACIC SPINE FINDINGS Alignment:  Physiologic. Vertebrae: There is a low T1/T2-weighted signal lesion of the T10 vertebral body that extends into both pedicles and the adjacent ribs. There are no other low T1-weighted signal osseous lesions to suggest other metastatic deposits. Cord: Area of hyperintense T2-weighted signal within the ventral cord at the T4 level and within the central spinal cord at the T11 level. There is associated volume loss at the T4 level. Paraspinal and other soft tissues: Unremarkable Disc levels:  There are small disc protrusions at T3-T4, T4-T5 and T12-L1 without central spinal canal stenosis. There is narrowing of the left lateral recess at T12-L1. MRI LUMBAR SPINE FINDINGS Segmentation:  Standard. Alignment:  Grade 1 anterolisthesis at L4-L5. Vertebrae: Sclerotic lesion of L5. Otherwise normal marrow. No acute fracture. No discitis-osteomyelitis. L1 hemangioma. Conus medullaris and cauda equina: Conus extends to the L1 level. There are multiple low signal lesions within the cauda equina (series 6 images 2, 4, 8, 9, 15). Paraspinal and other soft tissues: Negative. Disc levels: L1-L2: Normal L2-L3: Mild facet hypertrophy.  Normal disc.  No stenosis. L3-L4: No disc herniation or spinal canal stenosis. Moderate facet hypertrophy without foraminal stenosis. L4-L5: Severe bilateral facet hypertrophy with grade 1 anterolisthesis. Small pseudo disc bulge without stenosis. L5-S1: Mild disc bulge and moderate facet hypertrophy.  No stenosis. IMPRESSION: MR THORACIC SPINE IMPRESSION 1. Sclerotic lesion of the T10 vertebral body corresponds to the hypermetabolic lesion seen on the PET CT of 11/14/2016 and is consistent with osseous metastatic disease. 2. No other thoracic spine osseous metastases. 3. Multifocal hyperintense T2-weighted signal within the thoracic spinal cord, at the T4 and T11 levels. This most likely indicates chronic myelomalacia. MR LUMBAR SPINE IMPRESSION 1. Multifocal nodular signal abnormalities of the upper cauda equina. This is concerning for intrathecal metastatic disease. Postcontrast MRI is recommended for further characterization. 2. Sclerotic lesion of the L5 vertebral body corresponds to non-hypermetabolic sclerotic focus on the PET CT of 11/14/2016. 3. Severe L4-L5 facet arthrosis with  resultant grade 1 anterolisthesis. Electronically Signed   By: Ulyses Jarred M.D.   On: 03/06/2017 18:29   Mr Lumbar Spine Wo Contrast  Result Date: 03/06/2017 CLINICAL DATA:  Osseous metastatic  disease.  Breast cancer EXAM: MRI THORACIC AND LUMBAR SPINE WITHOUT CONTRAST TECHNIQUE: Multiplanar and multiecho pulse sequences of the thoracic and lumbar spine were obtained without intravenous contrast. COMPARISON:  PET CT 11/14/2016 FINDINGS: MRI THORACIC SPINE FINDINGS Alignment:  Physiologic. Vertebrae: There is a low T1/T2-weighted signal lesion of the T10 vertebral body that extends into both pedicles and the adjacent ribs. There are no other low T1-weighted signal osseous lesions to suggest other metastatic deposits. Cord: Area of hyperintense T2-weighted signal within the ventral cord at the T4 level and within the central spinal cord at the T11 level. There is associated volume loss at the T4 level. Paraspinal and other soft tissues: Unremarkable Disc levels: There are small disc protrusions at T3-T4, T4-T5 and T12-L1 without central spinal canal stenosis. There is narrowing of the left lateral recess at T12-L1. MRI LUMBAR SPINE FINDINGS Segmentation:  Standard. Alignment:  Grade 1 anterolisthesis at L4-L5. Vertebrae: Sclerotic lesion of L5. Otherwise normal marrow. No acute fracture. No discitis-osteomyelitis. L1 hemangioma. Conus medullaris and cauda equina: Conus extends to the L1 level. There are multiple low signal lesions within the cauda equina (series 6 images 2, 4, 8, 9, 15). Paraspinal and other soft tissues: Negative. Disc levels: L1-L2: Normal L2-L3: Mild facet hypertrophy.  Normal disc.  No stenosis. L3-L4: No disc herniation or spinal canal stenosis. Moderate facet hypertrophy without foraminal stenosis. L4-L5: Severe bilateral facet hypertrophy with grade 1 anterolisthesis. Small pseudo disc bulge without stenosis. L5-S1: Mild disc bulge and moderate facet hypertrophy.  No stenosis. IMPRESSION: MR THORACIC SPINE IMPRESSION 1. Sclerotic lesion of the T10 vertebral body corresponds to the hypermetabolic lesion seen on the PET CT of 11/14/2016 and is consistent with osseous metastatic  disease. 2. No other thoracic spine osseous metastases. 3. Multifocal hyperintense T2-weighted signal within the thoracic spinal cord, at the T4 and T11 levels. This most likely indicates chronic myelomalacia. MR LUMBAR SPINE IMPRESSION 1. Multifocal nodular signal abnormalities of the upper cauda equina. This is concerning for intrathecal metastatic disease. Postcontrast MRI is recommended for further characterization. 2. Sclerotic lesion of the L5 vertebral body corresponds to non-hypermetabolic sclerotic focus on the PET CT of 11/14/2016. 3. Severe L4-L5 facet arthrosis with resultant grade 1 anterolisthesis. Electronically Signed   By: Ulyses Jarred M.D.   On: 03/06/2017 18:29    Medications: I have reviewed the patient's current medications.  Assessment/Plan: 1.  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  Bilateral lower extremity weakness/numbness and urinary retention-likely secondary to metastatic disease involving the cauda equina  Decadron initiated 03/07/2017  Palliative radiation beginning 03/07/2017  3.  Pain secondary to metastatic breast cancer involving the bones  4.  Bipolar disorder   The leg weakness and numbness appears significantly improved today.  She will continue Decadron and palliative radiation.  We can attempt to remove the Foley catheter in the next few days.  I will discuss the case with Dr. Dina Rich regarding systemic treatment options.  Recommendations: 1.  Continue Decadron at the current dose, plan to wean to an oral regimen over the next 1-2 days 2.  Daily radiation 3.  Out of bed as  tolerated 4.  Lovenox for DVT prophylaxis    LOS: 2 days   Betsy Coder, MD   03/08/2017, 8:36 AM

## 2017-03-09 ENCOUNTER — Ambulatory Visit
Admit: 2017-03-09 | Discharge: 2017-03-09 | Disposition: A | Discharge status: Home / Self Care | Payer: Medicare Other | Attending: Radiation Oncology | Admitting: Radiation Oncology

## 2017-03-09 ENCOUNTER — Telehealth: Payer: Self-pay | Admitting: Oncology

## 2017-03-09 ENCOUNTER — Ambulatory Visit
Admission: RE | Admit: 2017-03-09 | Discharge: 2017-03-09 | Disposition: A | Discharge status: Home / Self Care | Payer: Medicare Other | Source: Ambulatory Visit | Admitting: Radiation Oncology

## 2017-03-09 DIAGNOSIS — C7951 Secondary malignant neoplasm of bone: Principal | ICD-10-CM

## 2017-03-09 DIAGNOSIS — C50919 Malignant neoplasm of unspecified site of unspecified female breast: Secondary | ICD-10-CM

## 2017-03-09 DIAGNOSIS — C50911 Malignant neoplasm of unspecified site of right female breast: Secondary | ICD-10-CM

## 2017-03-09 LAB — GLUCOSE, CAPILLARY
GLUCOSE-CAPILLARY: 125 mg/dL — AB (ref 65–99)
Glucose-Capillary: 158 mg/dL — ABNORMAL HIGH (ref 65–99)

## 2017-03-09 MED ORDER — POLYETHYLENE GLYCOL 3350 17 G PO PACK: 17.0000 g | PACK | Freq: Every day | ORAL | Status: DC | PRN

## 2017-03-09 MED ORDER — DEXAMETHASONE 4 MG PO TABS
6.0000 mg | ORAL_TABLET | Freq: Four times a day (QID) | ORAL | Status: DC
  Administered 2017-03-09 – 2017-03-10 (×5): 6 mg via ORAL
  Filled 2017-03-09 (×5): qty 2

## 2017-03-09 NOTE — Progress Notes (Signed)
  Radiation Oncology         [phone] ________________________________  Name: Ashlee Moore MRN: [id number]  Date: 03/09/2017  DOB: October 24, 1960  Simulation Verification Note    ICD-10-CM   1. Carcinoma of right breast metastatic to bone [facility]) C50.911    C79.51     Status: inpatient  NARRATIVE: The patient was brought to the treatment unit and placed in the planned treatment position. The clinical setup was verified. Then port films were obtained and uploaded to the radiation oncology medical record software.  The treatment beams were carefully compared against the planned radiation fields. The position location and shape of the radiation fields was reviewed. They targeted volume of tissue appears to be appropriately covered by the radiation beams. Organs at risk appear to be excluded as planned.  Based on my personal review, I approved the simulation verification. The patient's treatment will proceed as planned.  -----------------------------------  Blair Promise, PhD, MD

## 2017-03-09 NOTE — Care Management Important Message (Signed)
Important Message  Patient Details  Name: Ashlee Moore MRN: [id number] Date of Birth: April 05, 1960   Medicare Important Message Given:  Yes    Kerin Salen 03/09/2017, 1:02 [place] Message  Patient Details  Name: Ashlee Moore MRN: [id number] Date of Birth: 08-05-1960   Medicare Important Message Given:  Yes    Kerin Salen 03/09/2017, 1:02 PM

## 2017-03-09 NOTE — Progress Notes (Signed)
IP PROGRESS NOTE  Subjective:   She reports continued improvement in the leg weakness and numbness.  She reports burning at the perineum after receiving Dilaudid.  Objective: Vital signs in last 24 hours: Blood pressure (!) 102/57, pulse 67, temperature 98.2 F (36.8 C), temperature source Oral, resp. rate 14, height 5' 4" (1.626 m), weight 150 lb 9.2 oz (68.3 kg), SpO2 98 %.  Intake/Output from previous day: 01/27 0701 - 01/28 0700 In: 1500 [P.O.:1500] Out: 4800 [Urine:4800]  Physical Exam:  HEENT: No thrush Neurologic: Normal strength with dorsiflexion and plantar flexion at the feet, she is able to bend both knees, sensation intact at the legs bilaterally, numbness persist at the low abdomen   Portacath/PICC-without erythema  Lab Results: Recent Labs    03/06/17 1524 03/06/17 1532  WBC 6.0  --   HGB 10.6* 12.6  HCT 35.0* 37.0  PLT 238  --     BMET Recent Labs    03/06/17 1532 03/07/17 0815  NA 145 139  K 3.0* 4.3  CL 106 112*  CO2  --  24  GLUCOSE 30* 126*  BUN 8 8  CREATININE 0.50 0.47  CALCIUM  --  7.8*    No results found for: CEA1  Studies/Results: No results found.  Medications: I have reviewed the patient's current medications.  Assessment/Plan: 1.  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  Bilateral lower extremity weakness/numbness and urinary retention-likely secondary to metastatic disease involving the cauda equina  Decadron initiated 03/07/2017  Palliative radiation beginning 03/07/2017, T9-L4  3.  Pain secondary to metastatic breast cancer involving the bones  4.  Bipolar disorder   The leg weakness appears much improved.  I will taper the Decadron to by mouth.  She will continue palliative radiation.  She would like an attempt at discontinuing the Foley  catheter. I discussed the case with Dr. Dina Rich.  He plans to resume Herceptin therapy as an outpatient.  He reports she has not failed Herceptin to date.  He recommends we continue the current narcotic regimen for chronic pain. Recommendations: 1.  Change Decadron to by mouth 2.  Daily radiation 3.  PT evaluation 4.  Lovenox for DVT prophylaxis 5.  Remove Foley catheter as tolerated 6.  Outpatient follow-up with Dr. Dina Rich at discharge    LOS: 3 days   Betsy Coder, MD   03/09/2017, 2:11 PM

## 2017-03-09 NOTE — Progress Notes (Signed)
  PROGRESS NOTE  Ashlee Moore MWN:[id number] DOB: 08-14-1960 DOA: 03/06/2017 PCP: Terald Sleeper, PA-C  Brief Narrative: 7yow PMH breast cancer with metastatic disease to T10 presented with weakness and numbness BLE. In ED profound motor and sensory deficits BLE. 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   Assessment/Plan BLE paralysis new onset, secondary to intrathecal metastatic disease causing cauda equina. Per NS no surgical options. - improving with XRT started 1/26. Can move both legs, R>L - change to oral dexamethasone  Metastatic breast cancer - per oncology  Urinary retention secondary to cauda equina - d/c foley and follow  Hypoglycemia  - resolved.  - CBG stable, will d/c insulin and CBG checks.  Mobitz II s/p PPM MRI compatible.   Essential HTN - remains stable.  Chronic pain of malignancy - stable. Continue fentanyl patch, oxycodone 1/28  DVT prophylaxis: enoxaparin Code Status: full Family Communication: none Disposition Plan: likely home   [name], MD  [facility] Direct contact: [phone] --[address]  --[email or web address]; password [email or web address]  7PM-7AM contact night coverage as above 03/09/2017, 1:25 PM  LOS: 3 days   Consultants:  Radiation oncology  Oncology   Procedures:  XRT  Antimicrobials:    Interval history/Subjective: Feeling better. Left leg strength improved > right leg strength. Optimistic to go home soon.  Objective: Vitals:  Vitals:   03/08/17 2136 03/09/17 0439  BP: 113/73 (!) 102/57  Pulse: 77 67  Resp: 15 14  Temp: 98.4 F (36.9 C) 98.2 F (36.8 C)  SpO2: 97% 98%    Exam:  Constitutional:   . Appears calm and comfortable Respiratory:  . CTA bilaterally, no w/r/r.  . Respiratory effort normal.  Cardiovascular:  . RRR, no m/r/g. I do not hear a murmur today Musculoskeletal:  . RLE, LLE   . Moves both legs to command, left with greater strength than  right Psychiatric:  . Mental status o Mood, affect appropriate  I have personally reviewed the following:   Labs:  CBG stable.  Scheduled Meds: . calcium-vitamin D  1 tablet Oral Q breakfast  . dexamethasone  6 mg Oral Q6H  . dextrose  1 ampule Intravenous Once  . enoxaparin (LOVENOX) injection  40 mg Subcutaneous QHS  . fentaNYL  200 mcg Transdermal Q72H  . insulin aspart  0-5 Units Subcutaneous QHS  . insulin aspart  0-9 Units Subcutaneous TID WC  . multivitamin with minerals  1 tablet Oral QHS  . potassium chloride SA  20 mEq Oral Daily  . pregabalin  100 mg Oral Daily  . tamoxifen  20 mg Oral Daily  . triamterene-hydrochlorothiazide  1 tablet Oral q morning - 10a   Continuous Infusions:  Principal Problem:   Paraplegia (HCC) Active Problems:   Breast cancer metastasized to bone [facility])   Chronic pain due to neoplasm   Metastatic cancer to spine ([place])   Hypoglycemia   Acute urinary retention   LOS: 3 days

## 2017-03-09 NOTE — Progress Notes (Signed)
  Radiation Oncology         [phone] ________________________________  Name: Ashlee Moore MRN: [id number]  Date: 03/09/2017  DOB: 24-Oct-1960  SIMULATION AND TREATMENT PLANNING NOTE - INPATIENT   DIAGNOSIS:  Metastatic breast cancer with new onset lower extremity weakness and sensory deficits  NARRATIVE:  The patient was brought to the [facility].  Identity was confirmed.  All relevant records and images related to the planned course of therapy were reviewed.  The patient freely provided informed written consent to proceed with treatment after reviewing the details related to the planned course of therapy. The consent form was witnessed and verified by the simulation staff.  Then, the patient was set-up in a stable reproducible  supine position for radiation therapy.  CT images were obtained.  Surface markings were placed.  The CT images were loaded into the planning software.  Then the target and avoidance structures were contoured.  Treatment planning then occurred.  The radiation prescription was entered and confirmed.  Then, I designed and supervised the construction of a total of 3 medically necessary complex treatment devices.  I have requested : 3D Simulation  I have requested a DVH of the following structures: GTV, spinal cord, kidneys.  I have ordered:CBC  PLAN:  The patient will receive 24 Gy in 8 fractions. Over the weekend the patient had received 6 gray in 2 fractions as emergent treatment for her lower extremity weakness related to her tumor. . Treatment will be directed at the T9-L4 spine.  Special treatment procedure note: Patient has received previous radiation therapy to the right chest wall region as part of the management of her right breast cancer. Given the potential for overlap and increased time in reviewing the patient's previous radiation as it relates to her current set up, this constitutes a special treatment  procedure.  -----------------------------------  Blair Promise, PhD, MD  This document serves as a record of services personally performed by Gery Pray, MD. It was created on his behalf by Steva Colder, a trained medical scribe. The creation of this record is based on the scribe's personal observations and the provider's statements to them. This document has been checked and approved by the attending provider.

## 2017-03-09 NOTE — Telephone Encounter (Signed)
Called the device clinic and spoke to [place].  She said she received the pacemaker form and will let Dr. Jackalyn Lombard nurse know that we need it back as soon as possible.

## 2017-03-10 ENCOUNTER — Ambulatory Visit
Admit: 2017-03-10 | Discharge: 2017-03-10 | Disposition: A | Discharge status: Home / Self Care | Payer: Medicare Other | Attending: Radiation Oncology | Admitting: Radiation Oncology

## 2017-03-10 ENCOUNTER — Telehealth: Payer: Self-pay | Admitting: Oncology

## 2017-03-10 LAB — GLUCOSE, CAPILLARY
GLUCOSE-CAPILLARY: 183 mg/dL — AB (ref 65–99)
Glucose-Capillary: 132 mg/dL — ABNORMAL HIGH (ref 65–99)

## 2017-03-10 MED ORDER — PREGABALIN 100 MG PO CAPS: ORAL_CAPSULE | ORAL | Status: DC

## 2017-03-10 MED ORDER — DEXAMETHASONE 4 MG PO TABS: 8.0000 mg | ORAL_TABLET | Freq: Two times a day (BID) | ORAL | 0 refills | Status: AC

## 2017-03-10 NOTE — Discharge Summary (Signed)
Physician Discharge Summary  Ashlee Moore UVO:[id number] DOB: Oct 28, 1960 DOA: 03/06/2017  PCP: Terald Sleeper, PA-C  Admit date: 03/06/2017 Discharge date: 03/10/2017  Recommendations for Outpatient Follow-up:  1. Metastatic breast cancer, see discussion below. 2. HHPT for BLE weakness.  Follow-up Information    Terald Sleeper, PA-C Follow up.   Specialties:  Librarian, academic, Family Medicine Why:  as needed Contact information: [place] [address] [phone]        Milus Height, MD Follow up.   Specialty:  Internal Medicine Why:  Keep already scheduled appointment Contact information: [place] [address] [phone]        [facility] [place] Follow up.   Specialty:  Home Health Services Contact information: [address] [place] [address] [phone]            Discharge Diagnoses:  1. BLE paralysis new onset, secondary to intrathecal metastatic disease causing cauda equina 2. Metastatic breast cancer 3. Urinary retention  4. Chronic pain of malignancy  Discharge Condition: improved Disposition: home  Diet recommendation: heart healthy  Filed Weights   03/07/17 0139 03/10/17 0422  Weight: 68.3 kg (150 lb 9.2 oz) 68.4 kg (150 lb 12.7 oz)    History of present illness:  60yow PMH breast cancer with metastatic disease to T10 presented with weakness and numbness BLE. In ED profound motor and sensory deficits BLE. 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   Hospital Course:  Patient responded rapidly to XRT and steroids with improvement in LE strength. She was seen by PT with recommendation for [facility] and right knee immobilizer when walking. She was seen both by oncology and radiation oncology during hospitalization which was uncomplicated. Individual issues below.  BLE paralysis new onset, secondary to intrathecal metastatic disease causing cauda equina. Per NS  no surgical options. - remarkably improvement with XRT and steroids - will continue XRT on discharge - discussed with Dr. Benay Spice. Ok for d/c home today with dexamethason 8 mg BID and Dr. Sondra Come will taper.  Metastatic breast cancer - Dr. Benay Spice spoke with patient's primary oncologist who will plan to see patient in office for follow-up. Patient has appointment already but can't recall date at the moment.  Urinary retention secondary to cauda equina  -resolved with XRT and steroids  Chronic pain of malignancy - stable. Continue fentanyl patch, oxycodone    Consultants:  Radiation oncology  Oncology   Procedures:  XRT  Today's assessment: S: feels much better. Moving legs better. O: Vitals:  Vitals:   03/09/17 1959 03/10/17 0422  BP: 124/76 110/60  Pulse: 84 80  Resp: 14 12  Temp: 97.9 F (36.6 C) 98.7 F (37.1 C)  SpO2: 97% 98%    Constitutional:  . Appears calm and comfortable Respiratory:  . CTA bilaterally, no w/r/r.  . Respiratory effort normal.  Cardiovascular:  . RRR, no m/r/g Musculoskeletal:  . RLE, LLE   . Able to lift both legs off the ground. Strength left leg greater than right leg. Psychiatric:  . Mental status o Mood happy, affect bright  Discharge Instructions  Discharge Instructions    Diet - low sodium heart healthy   Complete by:  As directed    Discharge instructions   Complete by:  As directed    Call your physician or seek immediate medical attention for weakness, numbness, difficulty walking, difficulty urinating or with bowels or worsening of condition.   Increase activity slowly  Complete by:  As directed      Allergies as of 03/10/2017      Reactions   Penicillins Swelling   Sulfasalazine Rash   Sulfonamide Derivatives Rash   Latex    adhesive   Tape Other (See Comments)   Adhesive tape- RASH, SWELLING All adhesives      Medication List    STOP taking these medications   hydrochlorothiazide 25 MG  tablet Commonly known as:  HYDRODIURIL     TAKE these medications   Calcium Carbonate-Vitamin D 600-400 MG-UNIT tablet Take 1 tablet by mouth daily.   cyclobenzaprine 10 MG tablet Commonly known as:  FLEXERIL TAKE 1 TABLET BY MOUTH AT BEDTIME FOR MUSCLE SPASMS What changed:  See the new instructions.   dexamethasone 4 MG tablet Commonly known as:  DECADRON Take 2 tablets (8 mg total) by mouth 2 (two) times daily for 14 days.   fentaNYL 100 MCG/HR Commonly known as:  DURAGESIC - dosed mcg/hr Place 400 mcg onto the skin every 3 (three) days. Rotate   hydroxypropyl methylcellulose / hypromellose 2.5 % ophthalmic solution Commonly known as:  ISOPTO TEARS / GONIOVISC Place 1 drop into both eyes 4 (four) times daily as needed (dry eyes).   KLONOPIN 1 MG tablet Generic drug:  clonazePAM Take 1 mg by mouth 2 (two) times daily as needed for anxiety.   lidocaine-prilocaine cream Commonly known as:  EMLA Apply 1 application topically as needed (prior to chemo). For port (2.5%/2/5%)   ondansetron 8 MG tablet Commonly known as:  ZOFRAN TAKE 1 TABLET BY MOUTH EVERY 12 HOURS AS NEEDED What changed:    how much to take  how to take this  when to take this   Oxycodone HCl 20 MG Tabs Take 40 mg by mouth every 3 (three) hours.   potassium chloride SA 20 MEQ tablet Commonly known as:  K-DUR,KLOR-CON Take 20 mEq by mouth daily.   pregabalin 100 MG capsule Commonly known as:  LYRICA TAKE 100 mg CAPSULE BY MOUTH DAILY FOR PAIN   tamoxifen 20 MG tablet Commonly known as:  NOLVADEX Take 20 mg by mouth.   THERA Tabs Take 1 tablet by mouth at bedtime.   triamterene-hydrochlorothiazide 37.5-25 MG tablet Commonly known as:  MAXZIDE-25 TAKE 1 TABLET BY MOUTH EVERY MORNING      Allergies  Allergen Reactions  . Penicillins Swelling  . Sulfasalazine Rash  . Sulfonamide Derivatives Rash  . Latex     adhesive  . Tape Other (See Comments)    Adhesive tape- RASH,  SWELLING   All adhesives    The results of significant diagnostics from this hospitalization (including imaging, microbiology, ancillary and laboratory) are listed below for reference.    Significant Diagnostic Studies: Mr Thoracic Spine Wo Contrast  Result Date: 03/06/2017 CLINICAL DATA:  Osseous metastatic disease.  Breast cancer EXAM: MRI THORACIC AND LUMBAR SPINE WITHOUT CONTRAST TECHNIQUE: Multiplanar and multiecho pulse sequences of the thoracic and lumbar spine were obtained without intravenous contrast. COMPARISON:  PET CT 11/14/2016 FINDINGS: MRI THORACIC SPINE FINDINGS Alignment:  Physiologic. Vertebrae: There is a low T1/T2-weighted signal lesion of the T10 vertebral body that extends into both pedicles and the adjacent ribs. There are no other low T1-weighted signal osseous lesions to suggest other metastatic deposits. Cord: Area of hyperintense T2-weighted signal within the ventral cord at the T4 level and within the central spinal cord at the T11 level. There is associated volume loss at the T4 level. Paraspinal and other soft  tissues: Unremarkable Disc levels: There are small disc protrusions at T3-T4, T4-T5 and T12-L1 without central spinal canal stenosis. There is narrowing of the left lateral recess at T12-L1. MRI LUMBAR SPINE FINDINGS Segmentation:  Standard. Alignment:  Grade 1 anterolisthesis at L4-L5. Vertebrae: Sclerotic lesion of L5. Otherwise normal marrow. No acute fracture. No discitis-osteomyelitis. L1 hemangioma. Conus medullaris and cauda equina: Conus extends to the L1 level. There are multiple low signal lesions within the cauda equina (series 6 images 2, 4, 8, 9, 15). Paraspinal and other soft tissues: Negative. Disc levels: L1-L2: Normal L2-L3: Mild facet hypertrophy.  Normal disc.  No stenosis. L3-L4: No disc herniation or spinal canal stenosis. Moderate facet hypertrophy without foraminal stenosis. L4-L5: Severe bilateral facet hypertrophy with grade 1 anterolisthesis.  Small pseudo disc bulge without stenosis. L5-S1: Mild disc bulge and moderate facet hypertrophy.  No stenosis. IMPRESSION: MR THORACIC SPINE IMPRESSION 1. Sclerotic lesion of the T10 vertebral body corresponds to the hypermetabolic lesion seen on the PET CT of 11/14/2016 and is consistent with osseous metastatic disease. 2. No other thoracic spine osseous metastases. 3. Multifocal hyperintense T2-weighted signal within the thoracic spinal cord, at the T4 and T11 levels. This most likely indicates chronic myelomalacia. MR LUMBAR SPINE IMPRESSION 1. Multifocal nodular signal abnormalities of the upper cauda equina. This is concerning for intrathecal metastatic disease. Postcontrast MRI is recommended for further characterization. 2. Sclerotic lesion of the L5 vertebral body corresponds to non-hypermetabolic sclerotic focus on the PET CT of 11/14/2016. 3. Severe L4-L5 facet arthrosis with resultant grade 1 anterolisthesis. Electronically Signed   By: Ulyses Jarred M.D.   On: 03/06/2017 18:29   Mr Lumbar Spine Wo Contrast  Result Date: 03/06/2017 CLINICAL DATA:  Osseous metastatic disease.  Breast cancer EXAM: MRI THORACIC AND LUMBAR SPINE WITHOUT CONTRAST TECHNIQUE: Multiplanar and multiecho pulse sequences of the thoracic and lumbar spine were obtained without intravenous contrast. COMPARISON:  PET CT 11/14/2016 FINDINGS: MRI THORACIC SPINE FINDINGS Alignment:  Physiologic. Vertebrae: There is a low T1/T2-weighted signal lesion of the T10 vertebral body that extends into both pedicles and the adjacent ribs. There are no other low T1-weighted signal osseous lesions to suggest other metastatic deposits. Cord: Area of hyperintense T2-weighted signal within the ventral cord at the T4 level and within the central spinal cord at the T11 level. There is associated volume loss at the T4 level. Paraspinal and other soft tissues: Unremarkable Disc levels: There are small disc protrusions at T3-T4, T4-T5 and T12-L1 without  central spinal canal stenosis. There is narrowing of the left lateral recess at T12-L1. MRI LUMBAR SPINE FINDINGS Segmentation:  Standard. Alignment:  Grade 1 anterolisthesis at L4-L5. Vertebrae: Sclerotic lesion of L5. Otherwise normal marrow. No acute fracture. No discitis-osteomyelitis. L1 hemangioma. Conus medullaris and cauda equina: Conus extends to the L1 level. There are multiple low signal lesions within the cauda equina (series 6 images 2, 4, 8, 9, 15). Paraspinal and other soft tissues: Negative. Disc levels: L1-L2: Normal L2-L3: Mild facet hypertrophy.  Normal disc.  No stenosis. L3-L4: No disc herniation or spinal canal stenosis. Moderate facet hypertrophy without foraminal stenosis. L4-L5: Severe bilateral facet hypertrophy with grade 1 anterolisthesis. Small pseudo disc bulge without stenosis. L5-S1: Mild disc bulge and moderate facet hypertrophy.  No stenosis. IMPRESSION: MR THORACIC SPINE IMPRESSION 1. Sclerotic lesion of the T10 vertebral body corresponds to the hypermetabolic lesion seen on the PET CT of 11/14/2016 and is consistent with osseous metastatic disease. 2. No other thoracic spine osseous metastases. 3. Multifocal  hyperintense T2-weighted signal within the thoracic spinal cord, at the T4 and T11 levels. This most likely indicates chronic myelomalacia. MR LUMBAR SPINE IMPRESSION 1. Multifocal nodular signal abnormalities of the upper cauda equina. This is concerning for intrathecal metastatic disease. Postcontrast MRI is recommended for further characterization. 2. Sclerotic lesion of the L5 vertebral body corresponds to non-hypermetabolic sclerotic focus on the PET CT of 11/14/2016. 3. Severe L4-L5 facet arthrosis with resultant grade 1 anterolisthesis. Electronically Signed   By: Ulyses Jarred M.D.   On: 03/06/2017 18:29   Labs: Basic Metabolic Panel: Recent Labs  Lab 03/06/17 1532 03/07/17 0815  NA 145 139  K 3.0* 4.3  CL 106 112*  CO2  --  24  GLUCOSE 30* 126*  BUN 8 8   CREATININE 0.50 0.47  CALCIUM  --  7.8*   CBC: Recent Labs  Lab 03/06/17 1524 03/06/17 1532  WBC 6.0  --   NEUTROABS 4.1  --   HGB 10.6* 12.6  HCT 35.0* 37.0  MCV 79.7  --   PLT 238  --     CBG: Recent Labs  Lab 03/08/17 2121 03/09/17 0908 03/09/17 1236 03/10/17 0753 03/10/17 1124  GLUCAP 144* 158* 125* 183* 132*    Principal Problem:   Paraplegia (HCC) Active Problems:   Breast cancer metastasized to bone [facility])   Chronic pain due to neoplasm   Metastatic cancer to spine ([place])   Acute urinary retention   Time coordinating discharge: 35 minutes  Signed:  [name], MD [facility] 03/10/2017, 3:22 PM

## 2017-03-10 NOTE — Care Management Note (Signed)
Case Management Note  Patient Details  Name: Ashlee Moore MRN: [id number] Date of Birth: 06-13-1960  Subjective/Objective:   57 yo admitted with Paraplegia                 Action/Plan: From home with family. PT recommendations reviewed with pt. Choice offered for home health services and [facility] chosen. [facility] rep alerted of referral.  Expected Discharge Date:  03/10/17               Expected Discharge Plan:  [place]  In-House Referral:     Discharge planning Services  CM Consult  Post Acute Care Choice:  Home Health Choice offered to:  Patient  DME Arranged:    DME Agency:     HH Arranged:  PT [place]:  [place]  Status of Service:  Completed, signed off  If discussed at [place] of Stay Meetings, dates discussed:    Additional Comments:  Lynnell Catalan, RN 03/10/2017, 1:09 PM

## 2017-03-10 NOTE — Progress Notes (Signed)
Nsg Discharge Note  Admit Date:  03/06/2017 Discharge date: 03/10/2017   Gerline Legacy to be D/C'd home per MD order.  AVS completed, reviewed and given to patient.  Patient signed, dated, and timed the AVS. Patient/caregiver able to verbalize understanding.  Discharge Medication: Allergies as of 03/10/2017      Reactions   Penicillins Swelling   Sulfasalazine Rash   Sulfonamide Derivatives Rash   Latex    adhesive   Tape Other (See Comments)   Adhesive tape- RASH, SWELLING All adhesives      Medication List    STOP taking these medications   hydrochlorothiazide 25 MG tablet Commonly known as:  HYDRODIURIL     TAKE these medications   Calcium Carbonate-Vitamin D 600-400 MG-UNIT tablet Take 1 tablet by mouth daily.   cyclobenzaprine 10 MG tablet Commonly known as:  FLEXERIL TAKE 1 TABLET BY MOUTH AT BEDTIME FOR MUSCLE SPASMS What changed:  See the new instructions.   dexamethasone 4 MG tablet Commonly known as:  DECADRON Take 2 tablets (8 mg total) by mouth 2 (two) times daily for 14 days.   fentaNYL 100 MCG/HR Commonly known as:  DURAGESIC - dosed mcg/hr Place 400 mcg onto the skin every 3 (three) days. Rotate   hydroxypropyl methylcellulose / hypromellose 2.5 % ophthalmic solution Commonly known as:  ISOPTO TEARS / GONIOVISC Place 1 drop into both eyes 4 (four) times daily as needed (dry eyes).   KLONOPIN 1 MG tablet Generic drug:  clonazePAM Take 1 mg by mouth 2 (two) times daily as needed for anxiety.   lidocaine-prilocaine cream Commonly known as:  EMLA Apply 1 application topically as needed (prior to chemo). For port (2.5%/2/5%)   ondansetron 8 MG tablet Commonly known as:  ZOFRAN TAKE 1 TABLET BY MOUTH EVERY 12 HOURS AS NEEDED What changed:    how much to take  how to take this  when to take this   Oxycodone HCl 20 MG Tabs Take 40 mg by mouth every 3 (three) hours.   potassium chloride SA 20 MEQ tablet Commonly known as:   K-DUR,KLOR-CON Take 20 mEq by mouth daily.   pregabalin 100 MG capsule Commonly known as:  LYRICA TAKE 100 mg CAPSULE BY MOUTH DAILY FOR PAIN   tamoxifen 20 MG tablet Commonly known as:  NOLVADEX Take 20 mg by mouth.   THERA Tabs Take 1 tablet by mouth at bedtime.   triamterene-hydrochlorothiazide 37.5-25 MG tablet Commonly known as:  MAXZIDE-25 TAKE 1 TABLET BY MOUTH EVERY MORNING       Discharge Assessment: Vitals:   03/10/17 0422 03/10/17 1400  BP: 110/60 119/83  Pulse: 80 94  Resp: 12 16  Temp: 98.7 F (37.1 C) 98 F (36.7 C)  SpO2: 98% 100%   Skin clean, dry and intact without evidence of skin break down, no evidence of skin tears noted. Buttocks showed redness that was blanchable. IV catheter discontinued intact. Site without signs and symptoms of complications - no redness or edema noted at insertion site, patient denies c/o pain - only slight tenderness at site.  Dressing with slight pressure applied.  D/c Instructions-Education: Discharge instructions given to patient with verbalized understanding. D/c education completed with patient including follow up instructions, medication list, d/c activities limitations if indicated, with other d/c instructions as indicated by MD - patient able to verbalize understanding, all questions fully answered. Patient instructed to return to ED, call 911, or call MD for any changes in condition.  Patient escorted via [facility]  by student nurse and accompanied by her family member to the front entrance where her family member stated that they had parked the car.   Dorita Fray, RN 03/10/2017 4:09PM

## 2017-03-10 NOTE — Progress Notes (Signed)
   03/10/17 1200  Clinical Encounter Type  Visited With Patient  Visit Type Follow-up;Psychological support;Spiritual support  Referral From Nurse  Consult/Referral To Chaplain  Spiritual Encounters  Spiritual Needs Emotional;Other (Comment) (Spiritual Care Conversation/Support)  Stress Factors  Patient Stress Factors Family relationships;Health changes;Major life changes;Loss   I followed up with the patient per Spiritual Care consult.  The patient was very upset over losing custody of her grandson, whom she had been raising.  She is dealing with loss and grief. The patient states that "cancer has taken everything" from her.  The patient requested prayer and we prayed together. She is also anxious about getting a medical release of information form so that her lawyer can present it to the courts on Thursday over the custody of her grandson.   Please, contact Spiritual Care for further assistance.   Chaplain Shanon Ace M.Div., [facility]

## 2017-03-10 NOTE — Progress Notes (Signed)
Pt here for patient teaching.  Pt given Radiation and You booklet and skin care instructions.  Reviewed areas of pertinence such as diarrhea, fatigue, nausea and vomiting and skin changes . Pt able to give teach back of ,avoid applying anything to skin within 4 hours of treatment. Pt verbalizes understanding of information given and will contact nursing with any questions or concerns.  Patient states that she had radiation before and that she already know the side effect. I still went over the side effects  Http://rtanswers.org/treatmentinformation/whattoexpect/index

## 2017-03-10 NOTE — Telephone Encounter (Signed)
Called [name] with [employer] and set up a pacemaker check for today.  Also called patient's nurse, Olivia Mackie, RN on [address] to let her know that [name] will be check Ashlee Moore pacemaker today.

## 2017-03-10 NOTE — Evaluation (Signed)
Physical Therapy Evaluation Patient Details Name: Ashlee Moore MRN: [id number] DOB: 1960/08/22 Today's Date: 03/10/2017   History of Present Illness  89yow PMH breast cancer with metastatic disease to T10 presented with weakness and numbness BLE. In ED profound motor and sensory deficits BLE. 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  Clinical Impression  Patient presents with decreased independence with mobility due to weakness in LE's R > L, decreased balance, decreased activity tolerance and decreased sensation.  She will need skilled PT in the acute setting to allow d/c home with family/aide assist and follow up HHPT.  Feel safe for d/c home with current equipment and assistance.  May benefit from knee immobilizer if to attempt ambulation due to R knee buckling.     Follow Up Recommendations Home health PT;Supervision/Assistance - 24 hour    Equipment Recommendations  Other (comment)(knee immobilizer)    Recommendations for Other Services       Precautions / Restrictions Precautions Precautions: Fall Restrictions Weight Bearing Restrictions: No      Mobility  Bed Mobility Overal bed mobility: Needs Assistance Bed Mobility: Rolling;Sidelying to Sit Rolling: Min assist Sidelying to sit: Supervision       General bed mobility comments: cues for technique due to spinal mets maintained spinal precautions; used rail and assist to flex R knee, able to roll on her own and bring legs off bed and push up with hands  Transfers Overall transfer level: Needs assistance Equipment used: Rolling walker (2 wheeled);None Transfers: Sit to/from Air traffic controller Transfers Sit to Stand: Min assist;From elevated surface Stand pivot transfers: Min assist Squat pivot transfers: Min guard     General transfer comment: stood with RW from EOB and stepped to recliner with RW cautious that R knee did not buckle; educated in squat pivot  with BSC close, and body positioning; performed to [facility], W/C and back to recliner  Ambulation/Gait             General Gait Details: deferred per pt  Hotel manager mobility: Yes Wheelchair propulsion: Both upper extremities Wheelchair parts: Supervision/cueing Distance: 5 Wheelchair Assistance Details (indicate cue type and reason): in room for positioning near recliner  Modified Rankin (Stroke Patients Only)       Balance Overall balance assessment: Needs assistance   Sitting balance-Leahy Scale: Good Sitting balance - Comments: able to take resistance, able to bring legs up to adjust/doff socks and to don boots with supervision   Standing balance support: During functional activity;Bilateral upper extremity supported Standing balance-Leahy Scale: Poor Standing balance comment: UE support for balance in standing                             Pertinent Vitals/Pain Pain Assessment: No/denies pain    Home Living Family/patient expects to be discharged to:: Private residence Living Arrangements: Children;Other relatives(brother, son and grandson) Available Help at Discharge: Family;Available 24 hours/day Type of Home: House Home Access: Stairs to enter Entrance Stairs-Rails: Right Entrance Stairs-Number of Steps: 2 small steps Home Layout: One level Home Equipment: Grab bars - tub/shower;Grab bars - toilet;Bedside commode;Tub bench;Walker - 4 wheels;Hospital bed;Wheelchair - manual Additional Comments: lift chair    Prior Function Level of Independence: Independent with assistive device(s)         Comments: did not do chores, aide M-F 4 hours     Hand  Dominance   Dominant Hand: Right    Extremity/Trunk Assessment   Upper Extremity Assessment Upper Extremity Assessment: Overall WFL for tasks assessed    Lower Extremity Assessment Lower Extremity Assessment: RLE deficits/detail;LLE  deficits/detail RLE Deficits / Details: AAROM WFL, strength hip flexion 2-/5, knee extension 3-/5, ankle DF 2+/5; reports numbness and tingling up to waist, but gradually better more proximal RLE Sensation: history of peripheral neuropathy;decreased light touch;decreased proprioception RLE Coordination: decreased gross motor LLE Deficits / Details: AAROM WFL, strength hip flexion 2+/5, knee extension 4-/5, ankle DF 4-/5; reports numbness and tingling up to waist, but gradually better more proximal LLE Sensation: history of peripheral neuropathy;decreased proprioception;decreased light touch LLE Coordination: decreased gross motor       Communication   Communication: No difficulties  Cognition Arousal/Alertness: Awake/alert Behavior During Therapy: WFL for tasks assessed/performed Overall Cognitive Status: Within Functional Limits for tasks assessed                                        General Comments General comments (skin integrity, edema, etc.): Educated pt on safety with having assist for mobility at home and at hosptial, but safe for OOB transfer for toileting     Exercises     Assessment/Plan    PT Assessment Patient needs continued PT services  PT Problem List Decreased strength;Decreased mobility;Decreased knowledge of precautions;Impaired sensation;Decreased balance;Decreased activity tolerance;Decreased knowledge of use of DME       PT Treatment Interventions Functional mobility training;Balance training;DME instruction;Patient/family education;Therapeutic activities;Wheelchair mobility training;Therapeutic exercise    PT Goals (Current goals can be found in the Care Plan section)  Acute Rehab PT Goals Patient Stated Goal: To go home (has court date on Thursday) PT Goal Formulation: With patient Time For Goal Achievement: 03/13/17 Potential to Achieve Goals: Fair    Frequency Min 3X/week   Barriers to discharge        Co-evaluation                AM-PAC PT "6 Clicks" Daily Activity  Outcome Measure Difficulty turning over in bed (including adjusting bedclothes, sheets and blankets)?: A Little Difficulty moving from lying on back to sitting on the side of the bed? : A Little Difficulty sitting down on and standing up from a chair with arms (e.g., wheelchair, bedside commode, etc,.)?: Unable Help needed moving to and from a bed to chair (including a wheelchair)?: A Little Help needed walking in hospital room?: A Lot Help needed climbing 3-5 steps with a railing? : Total 6 Click Score: 13    End of Session Equipment Utilized During Treatment: Gait belt Activity Tolerance: Patient tolerated treatment well Patient left: in chair;with chair alarm set;with call bell/phone within reach Nurse Communication: Mobility status PT Visit Diagnosis: Muscle weakness (generalized) (M62.81);Other symptoms and signs involving the nervous system (S50.539)    Time: 1127-1204 PT Time Calculation (min) (ACUTE ONLY): 37 min   Charges:   PT Evaluation $PT Eval Moderate Complexity: 1 Mod PT Treatments $Therapeutic Activity: 8-22 mins   PT G CodesMagda Kiel, [place] [phone] 03/10/2017   Reginia Naas 03/10/2017, 12:23 PM

## 2017-03-11 ENCOUNTER — Ambulatory Visit: Payer: Medicare Other

## 2017-03-11 ENCOUNTER — Telehealth: Payer: Self-pay | Admitting: Oncology

## 2017-03-11 NOTE — Telephone Encounter (Signed)
Called patient regarding her missed radiation appointment today.  She said she was on her way to radiation and had car trouble.  She will be here for her appointment tomorrow.

## 2017-03-12 ENCOUNTER — Ambulatory Visit: Payer: Medicare Other

## 2017-03-13 ENCOUNTER — Ambulatory Visit
Admission: RE | Admit: 2017-03-13 | Discharge: 2017-03-13 | Disposition: A | Discharge status: Home / Self Care | Payer: Medicare Other | Source: Ambulatory Visit | Attending: Radiation Oncology | Admitting: Radiation Oncology

## 2017-03-13 ENCOUNTER — Telehealth: Payer: Self-pay | Admitting: Oncology

## 2017-03-13 DIAGNOSIS — Z510 Encounter for antineoplastic radiation therapy: Secondary | ICD-10-CM | POA: Diagnosis present

## 2017-03-13 DIAGNOSIS — C7951 Secondary malignant neoplasm of bone: Secondary | ICD-10-CM | POA: Insufficient documentation

## 2017-03-13 DIAGNOSIS — C50911 Malignant neoplasm of unspecified site of right female breast: Secondary | ICD-10-CM | POA: Insufficient documentation

## 2017-03-13 NOTE — Telephone Encounter (Addendum)
Called patient regarding her radiation treatment today.  She said she thought today was Thursday and that  the appointment was for 4 pm.  Advised her that today is Friday and the appointment was at 12:15.  She said she just got out of court for custody of her grandson and would need to reschedule for latter in the day.  Transferred her to [facility] to reschedule.

## 2017-03-16 ENCOUNTER — Ambulatory Visit: Payer: Medicare Other

## 2017-03-16 ENCOUNTER — Telehealth: Payer: Self-pay | Admitting: Oncology

## 2017-03-16 ENCOUNTER — Telehealth: Payer: Self-pay | Admitting: Medical Oncology

## 2017-03-16 NOTE — Telephone Encounter (Signed)
Attempted to call patient regarding her missed radiation appointment today.  Was not able to leave a voicemail message because voicemail is full.

## 2017-03-16 NOTE — Telephone Encounter (Signed)
Patient called back and said she needs to cancel treatment today because it would be too late before she got here today.  She is planning on coming tomorrow and is wondering if Dr. Sondra Come can increase the dose of her radiation.  Advised her that Dr. Sondra Come will be notified.

## 2017-03-16 NOTE — Telephone Encounter (Signed)
Transferred call to xrt.

## 2017-03-17 ENCOUNTER — Ambulatory Visit
Admission: RE | Admit: 2017-03-17 | Discharge: 2017-03-17 | Disposition: A | Discharge status: Home / Self Care | Payer: Medicare Other | Source: Ambulatory Visit | Attending: Radiation Oncology | Admitting: Radiation Oncology

## 2017-03-17 DIAGNOSIS — Z510 Encounter for antineoplastic radiation therapy: Secondary | ICD-10-CM | POA: Diagnosis not present

## 2017-03-18 ENCOUNTER — Ambulatory Visit: Payer: Medicare Other

## 2017-03-18 ENCOUNTER — Other Ambulatory Visit: Payer: Self-pay

## 2017-03-18 ENCOUNTER — Telehealth: Payer: Self-pay | Admitting: Oncology

## 2017-03-18 MED ORDER — CYCLOBENZAPRINE HCL 10 MG PO TABS: ORAL_TABLET | ORAL | 0 refills | Status: DC

## 2017-03-18 NOTE — Telephone Encounter (Signed)
Called patient and advised her that she can be treated at 10 today.  She verbalized agreement and will call us back.

## 2017-03-18 NOTE — Telephone Encounter (Signed)
Last seen 12/08/16   [facility]

## 2017-03-19 ENCOUNTER — Ambulatory Visit: Payer: Medicare Other

## 2017-03-19 ENCOUNTER — Ambulatory Visit
Admission: RE | Admit: 2017-03-19 | Discharge: 2017-03-19 | Disposition: A | Discharge status: Home / Self Care | Payer: Medicare Other | Source: Ambulatory Visit | Attending: Radiation Oncology | Admitting: Radiation Oncology

## 2017-03-19 DIAGNOSIS — Z510 Encounter for antineoplastic radiation therapy: Secondary | ICD-10-CM | POA: Diagnosis not present

## 2017-03-20 ENCOUNTER — Telehealth: Payer: Self-pay | Admitting: Oncology

## 2017-03-20 ENCOUNTER — Ambulatory Visit: Payer: Medicare Other

## 2017-03-20 NOTE — Telephone Encounter (Signed)
Ashlee Moore called and said she would not be able to make her treatment time today because they have to pick up her grandson from school at 3:15.  If possible she would like a 12 pm treatment time on Wednesday since she missed today.  [facility] notified.

## 2017-03-23 ENCOUNTER — Ambulatory Visit: Payer: Medicare Other

## 2017-03-23 ENCOUNTER — Ambulatory Visit
Admission: RE | Admit: 2017-03-23 | Discharge: 2017-03-23 | Disposition: A | Discharge status: Home / Self Care | Payer: Medicare Other | Source: Ambulatory Visit | Attending: Radiation Oncology | Admitting: Radiation Oncology

## 2017-03-23 DIAGNOSIS — C50911 Malignant neoplasm of unspecified site of right female breast: Secondary | ICD-10-CM

## 2017-03-23 DIAGNOSIS — Z510 Encounter for antineoplastic radiation therapy: Secondary | ICD-10-CM | POA: Diagnosis not present

## 2017-03-23 DIAGNOSIS — C7951 Secondary malignant neoplasm of bone: Principal | ICD-10-CM

## 2017-03-24 ENCOUNTER — Ambulatory Visit: Payer: Medicare Other

## 2017-03-25 ENCOUNTER — Encounter: Payer: Self-pay | Admitting: Physician Assistant

## 2017-03-25 ENCOUNTER — Telehealth: Payer: Self-pay | Admitting: Oncology

## 2017-03-25 ENCOUNTER — Ambulatory Visit (INDEPENDENT_AMBULATORY_CARE_PROVIDER_SITE_OTHER): Payer: Medicare Other | Admitting: Physician Assistant

## 2017-03-25 ENCOUNTER — Ambulatory Visit: Payer: Medicare Other

## 2017-03-25 VITALS — BP 103/72 | HR 89 | Temp 98.4°F | Ht 64.0 in

## 2017-03-25 DIAGNOSIS — G8220 Paraplegia, unspecified: Secondary | ICD-10-CM

## 2017-03-25 DIAGNOSIS — C50911 Malignant neoplasm of unspecified site of right female breast: Secondary | ICD-10-CM

## 2017-03-25 DIAGNOSIS — C7951 Secondary malignant neoplasm of bone: Secondary | ICD-10-CM

## 2017-03-25 NOTE — Telephone Encounter (Signed)
Patient called and said she has a doctor's appointment at 37 today and does not think she will make her 12:00 radiation treatment.  She was transferred to [facility] to reschedule.

## 2017-03-25 NOTE — Patient Instructions (Signed)
In a few days you may receive a survey in the mail or online from [employer] regarding your visit with us today. Please take a moment to fill this out. Your feedback is very important to our whole office. It can help us better understand your needs as well as improve your experience and satisfaction. Thank you for taking your time to complete it. We care about you.  Shamir Tuzzolino, PA-C  

## 2017-03-26 ENCOUNTER — Telehealth: Payer: Self-pay | Admitting: Oncology

## 2017-03-26 ENCOUNTER — Ambulatory Visit: Payer: Medicare Other

## 2017-03-26 ENCOUNTER — Ambulatory Visit: Admission: RE | Admit: 2017-03-26 | Payer: Medicare Other | Source: Ambulatory Visit

## 2017-03-26 NOTE — Telephone Encounter (Signed)
Patient called and said her brother is just waking up (works nights) and she will not be able to make her 4 pm treatment today.  Advised her that the appointment will be canceled for today and added on for Monday.  Notified [facility].

## 2017-03-27 ENCOUNTER — Ambulatory Visit: Payer: Medicare Other

## 2017-03-27 ENCOUNTER — Ambulatory Visit
Admission: RE | Admit: 2017-03-27 | Discharge: 2017-03-27 | Disposition: A | Discharge status: Home / Self Care | Payer: Medicare Other | Source: Ambulatory Visit | Attending: Radiation Oncology | Admitting: Radiation Oncology

## 2017-03-27 DIAGNOSIS — Z510 Encounter for antineoplastic radiation therapy: Secondary | ICD-10-CM | POA: Diagnosis not present

## 2017-03-29 NOTE — Progress Notes (Addendum)
BP 103/72   Pulse 89   Temp 98.4 F (36.9 C) (Oral)   Ht 5\' 4"  (1.626 m)   BMI 25.88 kg/m    Subjective:    Patient ID: Ashlee Moore, female    DOB: 21-Sep-1960, 57 y.o.   MRN: [id number]  HPI: Ashlee Moore is a 57 y.o. female presenting on 03/25/2017 for Hospitalization Follow-up  Patient comes in for follow-up from her hospitalization.  She had a great decrease in her ability to walk secondary to the metastases in her spine.  She has had a great improvement in recent days related to chemotherapy reducing the pressure on her spine.  She is starting to be able to move very well.  Mostly the movement is below the waist are limited.  She sees oncology at [facility] in [place].  This is part of the [facility] medicine system.  We have discussed that she can get her driver's license paper reviewed at her next visit.  Relevant past medical, surgical, family and social history reviewed and updated as indicated. Allergies and medications reviewed and updated.  Past Medical History:  Diagnosis Date  . Anemia   . Arthritis   . AV block, Mobitz 2 11/14/09   s/p MDT Revo PPM by JA  . Bipolar disorder ([place])   . Breast cancer metastasized to bone ([place]) 08/16/2012  . Cancer ([place])    right breast/LAST CHEOM 10/07/11  . Chills   . Chronic pain 12/13/2015  . Easy bruising   . Elevated LFTs    hx  . Fever   . Fibromyalgia   . Generalized headaches   . GERD (gastroesophageal reflux disease)   . Hypertension   . Morbid obesity with body mass index of 40.0-44.9 in adult ([place]) 10/22/2011   BMI 44.26    . Osteoporosis   . Pacemaker   . Sleep apnea    STOP BANG SCORE 4  . Weakness     Past Surgical History:  Procedure Laterality Date  . BILATERAL KNEE ARTHROSCOPY    . BREAST SURGERY  2012,12   lumpectomy,mastectomy bil  . CHOLECYSTECTOMY    . ECTOPIC PREGNANCY SURGERY     x2  . gastric bypass surgery    . left ovary and fallopian tube removed due to chronic pain    .  mastectomy  2013   bilateral  . MULTIPLE EXTRACTIONS WITH ALVEOLOPLASTY N/A 06/04/2012   Procedure: MULTIPLE EXTRACION WITH ALVEOLOPLASTY EXTRACT: 4, 5, 8, 9, 10, 13, 14 ,28;  Surgeon: Gae Bon, DDS;  Location: [facility];  Service: Oral Surgery;  Laterality: N/A;  . PACEMAKER INSERTION  2011  . plastic surgery on face     for ptosis of rt. eyelid  . PORTACATH PLACEMENT     power port - right  . PPM  11/14/09   MDT Revo implanted by Dr Rayann Heman for syncope/ Mobitz II AV block    Review of Systems  Constitutional: Negative for activity change, fatigue and fever.  HENT: Negative.   Eyes: Negative.   Respiratory: Negative.  Negative for cough.   Cardiovascular: Negative.  Negative for chest pain.  Gastrointestinal: Negative.  Negative for abdominal pain.  Endocrine: Negative.   Genitourinary: Negative.  Negative for dysuria.  Musculoskeletal: Positive for back pain and gait problem.  Skin: Negative.   Neurological: Positive for weakness. Negative for seizures, syncope, facial asymmetry and speech difficulty.    Allergies as of 03/25/2017      Reactions  Penicillins Swelling   Sulfasalazine Rash   Sulfonamide Derivatives Rash   Latex    adhesive   Tape Other (See Comments)   Adhesive tape- RASH, SWELLING All adhesives      Medication List        Accurate as of 03/25/17 11:59 PM. Always use your most recent med list.          Calcium Carbonate-Vitamin D 600-400 MG-UNIT tablet Take 1 tablet by mouth daily.   cyclobenzaprine 10 MG tablet Commonly known as:  FLEXERIL TAKE 1 TABLET BY MOUTH AT BEDTIME FOR MUSCLE SPASMS   fentaNYL 100 MCG/HR Commonly known as:  DURAGESIC - dosed mcg/hr Place 400 mcg onto the skin every 3 (three) days. Rotate   hydroxypropyl methylcellulose / hypromellose 2.5 % ophthalmic solution Commonly known as:  ISOPTO TEARS / GONIOVISC Place 1 drop into both eyes 4 (four) times daily as needed (dry eyes).   KLONOPIN 1 MG tablet Generic drug:   clonazePAM Take 1 mg by mouth 2 (two) times daily as needed for anxiety.   lidocaine-prilocaine cream Commonly known as:  EMLA Apply 1 application topically as needed (prior to chemo). For port (2.5%/2/5%)   ondansetron 8 MG tablet Commonly known as:  ZOFRAN TAKE 1 TABLET BY MOUTH EVERY 12 HOURS AS NEEDED   Oxycodone HCl 20 MG Tabs Take 40 mg by mouth every 3 (three) hours.   potassium chloride SA 20 MEQ tablet Commonly known as:  K-DUR,KLOR-CON Take 20 mEq by mouth daily.   pregabalin 100 MG capsule Commonly known as:  LYRICA TAKE 100 mg CAPSULE BY MOUTH DAILY FOR PAIN   tamoxifen 20 MG tablet Commonly known as:  NOLVADEX Take 20 mg by mouth.   THERA Tabs Take 1 tablet by mouth at bedtime.   triamterene-hydrochlorothiazide 37.5-25 MG tablet Commonly known as:  MAXZIDE-25 TAKE 1 TABLET BY MOUTH EVERY MORNING          Objective:    BP 103/72   Pulse 89   Temp 98.4 F (36.9 C) (Oral)   Ht 5\' 4"  (1.626 m)   BMI 25.88 kg/m   Allergies  Allergen Reactions  . Penicillins Swelling  . Sulfasalazine Rash  . Sulfonamide Derivatives Rash  . Latex     adhesive  . Tape Other (See Comments)    Adhesive tape- RASH, SWELLING   All adhesives    Physical Exam  Constitutional: She is oriented to person, place, and time. She appears well-developed and well-nourished.  HENT:  Head: Normocephalic and atraumatic.  Right Ear: Tympanic membrane, external ear and ear canal normal.  Left Ear: Tympanic membrane, external ear and ear canal normal.  Nose: Nose normal. No rhinorrhea.  Mouth/Throat: Oropharynx is clear and moist and mucous membranes are normal. No oropharyngeal exudate or posterior oropharyngeal erythema.  Eyes: Conjunctivae and EOM are normal. Pupils are equal, round, and reactive to light.  Neck: Normal range of motion. Neck supple.  Cardiovascular: Normal rate, regular rhythm, normal heart sounds and intact distal pulses.  Pulmonary/Chest: Effort normal and  breath sounds normal.  Abdominal: Soft. Bowel sounds are normal.  Neurological: She is alert and oriented to person, place, and time. She has normal reflexes. No cranial nerve deficit. She exhibits abnormal muscle tone.  Skin: Skin is warm and dry. No rash noted.  Psychiatric: She has a normal mood and affect. Her behavior is normal. Judgment and thought content normal.        Assessment & Plan:   1. 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 Current one belongs to family member. She is high risk for falls during her transfers.   2. Carcinoma of right breast metastatic to bone [facility])     Current Outpatient Medications:  .  Calcium Carbonate-Vitamin D 600-400 MG-UNIT per tablet, Take 1 tablet by mouth daily. , Disp: , Rfl:  .  clonazePAM (KLONOPIN) 1 MG tablet, Take 1 mg by mouth 2 (two) times daily as needed for anxiety. , Disp: , Rfl:  .  cyclobenzaprine (FLEXERIL) 10 MG tablet, TAKE 1 TABLET BY MOUTH AT BEDTIME FOR MUSCLE SPASMS, Disp: 30 tablet, Rfl: 0 .  fentaNYL (DURAGESIC - DOSED MCG/HR) 100 MCG/HR, Place 400 mcg onto the skin every 3 (three) days. Rotate, Disp: , Rfl:  .  hydroxypropyl methylcellulose (ISOPTO TEARS) 2.5 % ophthalmic solution, Place 1 drop into both eyes 4 (four) times daily as needed (dry eyes)., Disp: , Rfl:  .  lidocaine-prilocaine (EMLA) cream, Apply 1 application topically as needed (prior to chemo). For port (2.5%/2/5%), Disp: , Rfl:  .  Multiple Vitamin (THERA) TABS, Take 1 tablet by mouth at bedtime. , Disp: , Rfl:  .  ondansetron (ZOFRAN) 8 MG tablet, TAKE 1 TABLET BY MOUTH EVERY 12 HOURS AS NEEDED (Patient taking differently: TAKE 8 mg TABLET BY MOUTH EVERY 12 HOURS AS NEEDED), Disp: 14 tablet, Rfl: 0 .  Oxycodone HCl 20 MG TABS, Take 40 mg by mouth every 3 (three) hours. , Disp: , Rfl:  .  potassium chloride SA (K-DUR,KLOR-CON) 20 MEQ tablet, Take 20 mEq by mouth daily. , Disp: , Rfl:  .   pregabalin (LYRICA) 100 MG capsule, TAKE 100 mg CAPSULE BY MOUTH DAILY FOR PAIN, Disp: , Rfl:  .  tamoxifen (NOLVADEX) 20 MG tablet, Take 20 mg by mouth., Disp: , Rfl:  .  triamterene-hydrochlorothiazide (MAXZIDE-25) 37.5-25 MG tablet, TAKE 1 TABLET BY MOUTH EVERY MORNING, Disp: 30 tablet, Rfl: 2 Continue all other maintenance medications as listed above.  Follow up plan: Return in about 4 weeks (around 04/22/2017) for recheck.  Educational handout given for [place] PA-C [place] [address]  [place], [place] [address] [phone]   03/29/2017, 4:34 PM

## 2017-03-30 ENCOUNTER — Ambulatory Visit
Admission: RE | Admit: 2017-03-30 | Discharge: 2017-03-30 | Disposition: A | Discharge status: Home / Self Care | Payer: Medicare Other | Source: Ambulatory Visit | Attending: Radiation Oncology | Admitting: Radiation Oncology

## 2017-03-30 ENCOUNTER — Encounter: Payer: Self-pay | Admitting: Radiation Oncology

## 2017-03-30 DIAGNOSIS — Z510 Encounter for antineoplastic radiation therapy: Secondary | ICD-10-CM | POA: Diagnosis not present

## 2017-03-31 ENCOUNTER — Other Ambulatory Visit: Payer: Self-pay | Admitting: Oncology

## 2017-03-31 ENCOUNTER — Telehealth: Payer: Self-pay | Admitting: Oncology

## 2017-03-31 DIAGNOSIS — C7951 Secondary malignant neoplasm of bone: Secondary | ICD-10-CM

## 2017-03-31 NOTE — Telephone Encounter (Addendum)
Also called patient and let her know that decadron taper has been sent to pharmacy.  She said the last time she took decadron was yesterday morning and she will have the prescription picked up today.

## 2017-03-31 NOTE — Telephone Encounter (Signed)
Called Santiago Glad with [facility] and asked about obtaining a hospital bed and gait belt for patient.  She said an order will need to be placed for the bed and patient will need to purchase the gait belt from a medical supply store.  She will stop by today with a paper that needs to be signed for the hospital bed.

## 2017-04-01 ENCOUNTER — Telehealth: Payer: Self-pay | Admitting: Oncology

## 2017-04-01 NOTE — Progress Notes (Signed)
  Radiation Oncology         [phone] ________________________________  Name: Ashlee Moore MRN: [id number]  Date: 03/30/2017  DOB: 10-03-1960  End of Treatment Note  Diagnosis:   Metastatic breast cancer with new onset lower extremity weakness and sensory deficits.      Indication for treatment:  palliative.       Radiation treatment dates:   03/07/2017 to 03/30/2017  Site/dose:    1. The L-Spine was treated to 6 Gy in 2 fractions of 3 Gy (emergency treatment over the weekend) 2. The L-Spine was boosted to 24 Gy in 8 fractions of 3 Gy. (30 Gy total)  The patient received 24 Gy in 8 fractions. Over the weekend (1/26-1/27), the patient received 6 gray in 2 fractions as emergent treatment for her lower extremity weakness related to her tumor. Treatment will be directed at the T9-L4 spine.  Beams/energy:    1. Ap/pa // 15X 2. Ap/pa 3d10X  Narrative: The patient tolerated radiation treatment relatively well. She reports abdominal pain and some nausea. She also reports moderate fatigue and intermittent trouble swallowing. She denies shortness of breath or coughing. States she is walking better with the assistance of physical therapy. She has been tapering off her steroid medication. She is able to raise both legs off the wheelchair. She appears to have some easily reducible hernias along the abdominal region.  Sensory deficits improved significantly during the course of treatment  Plan: The patient has completed radiation treatment. The patient will return to radiation oncology clinic for routine followup in one month. I advised them to call or return sooner if they have any questions or concerns related to their recovery or treatment.  -----------------------------------  Blair Promise, PhD, MD  This document serves as a record of services personally performed by Gery Pray, MD. It was created on his behalf by Arlyce Harman, a trained medical scribe. The creation of this record  is based on the scribe's personal observations and the provider's statements to them. This document has been checked and approved by the attending provider.

## 2017-04-01 NOTE — Telephone Encounter (Addendum)
Received a call from Sharyn Lull, [place] with patients oncology clinic in [place].  She needed to verify patient's decadron dose.  Asked if they will be doing the taper and she said the [place] has requested that we handle the taper.  Called in taper per Dr. Sondra Come to [facility].

## 2017-04-06 ENCOUNTER — Other Ambulatory Visit: Payer: Self-pay | Admitting: *Deleted

## 2017-04-06 ENCOUNTER — Telehealth: Payer: Self-pay | Admitting: *Deleted

## 2017-04-06 DIAGNOSIS — C50911 Malignant neoplasm of unspecified site of right female breast: Secondary | ICD-10-CM

## 2017-04-06 DIAGNOSIS — G894 Chronic pain syndrome: Secondary | ICD-10-CM

## 2017-04-06 DIAGNOSIS — C7951 Secondary malignant neoplasm of bone: Principal | ICD-10-CM

## 2017-04-06 MED ORDER — TRIAMTERENE-HCTZ 37.5-25 MG PO TABS: 1.0000 | ORAL_TABLET | Freq: Every morning | ORAL | 5 refills | Status: DC

## 2017-04-06 NOTE — Telephone Encounter (Signed)
We have only seen her three times, as she is mainly seen by oncology. Is there a discharge summary or pharmacy report that contains these medications. We do have the potassium, but we have she is on triamterene/hydrochlorothiazide.

## 2017-04-06 NOTE — Telephone Encounter (Signed)
[facility] nurse visited with pt today Pt was a little vague with letting her seeing her medications Says she can not get her lasix filled until the 12th, she has 3+ pitting edema though. Lasix is not on her current med list. She did not want [place] nurse to call her provider about her meds. Also said her potassium was changed Please advise

## 2017-04-07 MED ORDER — FUROSEMIDE 40 MG PO TABS: 40.0000 mg | ORAL_TABLET | Freq: Every day | ORAL | 0 refills | Status: DC

## 2017-04-07 NOTE — Telephone Encounter (Signed)
Lasix prescription sent to pharmacy.  FYI, patient reports the edema is from her right knee into her foot.  She also has a large "knot" inside her right leg in the knee area that is concerned about.  She was offered an appointment to come in tomorrow to see you, but she declined and said she is going to go on to the ER this morning to have this evaluated.

## 2017-04-07 NOTE — Telephone Encounter (Signed)
In looking at chart review at all her information, she had seen you on 12/08/16 and her medication list included Furosemide 40 mg daily and Triamterene/HCTZ 37.5/25 daily, and there was no mention of discontinuing the Furosemide or making any changes on your office visit note.  She had an ED to hospital admission on 03/06/2017 and at intake the Furosemide was listed on her med list.  During that admission the med was discontinued from her med list by a pharmacy tech and the reason was "patient preference" (tech Herbie Saxon).  But, there is no documentation by any providers during that admission about discontinuing the medication for any reason.  And there is no mention of it any notes after discharge.

## 2017-04-07 NOTE — Telephone Encounter (Signed)
noted 

## 2017-04-07 NOTE — Telephone Encounter (Signed)
Please send one refill and I will be seeing her in a month or so. Plan for labs

## 2017-04-13 ENCOUNTER — Encounter: Payer: Self-pay | Admitting: Physician Assistant

## 2017-04-13 ENCOUNTER — Ambulatory Visit (INDEPENDENT_AMBULATORY_CARE_PROVIDER_SITE_OTHER): Payer: Medicare Other | Admitting: Physician Assistant

## 2017-04-13 VITALS — BP 115/71 | HR 95 | Temp 98.3°F | Ht 64.0 in

## 2017-04-13 DIAGNOSIS — I82432 Acute embolism and thrombosis of left popliteal vein: Secondary | ICD-10-CM | POA: Diagnosis not present

## 2017-04-13 DIAGNOSIS — C7951 Secondary malignant neoplasm of bone: Secondary | ICD-10-CM

## 2017-04-13 DIAGNOSIS — G8220 Paraplegia, unspecified: Secondary | ICD-10-CM | POA: Diagnosis not present

## 2017-04-13 DIAGNOSIS — C50911 Malignant neoplasm of unspecified site of right female breast: Secondary | ICD-10-CM

## 2017-04-13 MED ORDER — CLONAZEPAM 1 MG PO TABS: 1.0000 mg | ORAL_TABLET | Freq: Two times a day (BID) | ORAL | 2 refills | Status: DC | PRN

## 2017-04-13 MED ORDER — RIVAROXABAN 20 MG PO TABS: 20.0000 mg | ORAL_TABLET | Freq: Every day | ORAL | 5 refills | Status: DC

## 2017-04-13 NOTE — Patient Instructions (Signed)
In a few days you may receive a survey in the mail or online from [employer] regarding your visit with us today. Please take a moment to fill this out. Your feedback is very important to our whole office. It can help us better understand your needs as well as improve your experience and satisfaction. Thank you for taking your time to complete it. We care about you.  Bunny Lowdermilk, PA-C  

## 2017-04-13 NOTE — Progress Notes (Signed)
BP 115/71   Pulse 95   Temp 98.3 F (36.8 C) (Oral)   Ht 5\' 4"  (1.626 m)   BMI 25.88 kg/m    Subjective:    Patient ID: Ashlee Moore, female    DOB: 12-Aug-1960, 57 y.o.   MRN: [id number]  HPI: Ashlee Moore is a 57 y.o. female presenting on 04/13/2017 for Hospitalization Follow-up ([place] -DVT left knee )  Follow from [facility] admission for cellulitis and DVT in left popliteal vein. Had severe swelling, felt bad and skin was very red when she went to ED. She missed chemo that day but she felt very bad. US showed DVT. Took Lovenox first and then transferred to xarelto, tolerating well.  Expect to go onto 20 mg one daily as maintenance.    Past Medical History:  Diagnosis Date  . Anemia   . Arthritis   . AV block, Mobitz 2 11/14/09   s/p MDT Revo PPM by JA  . Bipolar disorder ([place])   . Breast cancer metastasized to bone ([place]) 08/16/2012  . Cancer ([place])    right breast/LAST CHEOM 10/07/11  . Chills   . Chronic pain 12/13/2015  . Easy bruising   . Elevated LFTs    hx  . Fever   . Fibromyalgia   . Generalized headaches   . GERD (gastroesophageal reflux disease)   . Hypertension   . Morbid obesity with body mass index of 40.0-44.9 in adult ([place]) 10/22/2011   BMI 44.26    . Osteoporosis   . Pacemaker   . Sleep apnea    STOP BANG SCORE 4  . Weakness    Relevant past medical, surgical, family and social history reviewed and updated as indicated. Interim medical history since our last visit reviewed. Allergies and medications reviewed and updated. DATA REVIEWED: CHART IN EPIC  Family History reviewed for pertinent findings.  Review of Systems  Constitutional: Negative.  Negative for activity change, fatigue and fever.  HENT: Negative.   Eyes: Negative.   Respiratory: Negative.  Negative for cough, shortness of breath and wheezing.   Cardiovascular: Positive for leg swelling. Negative for chest pain.  Gastrointestinal: Negative.  Negative for abdominal pain.    Endocrine: Negative.   Genitourinary: Negative.  Negative for dysuria.  Musculoskeletal: Positive for arthralgias, back pain and myalgias.  Skin: Positive for color change and rash.  Neurological: Negative.     Allergies as of 04/13/2017      Reactions   Penicillins Swelling   Sulfasalazine Rash   Sulfonamide Derivatives Rash   Latex    adhesive   Tape Other (See Comments)   Adhesive tape- RASH, SWELLING All adhesives      Medication List        Accurate as of 04/13/17 12:59 PM. Always use your most recent med list.          Calcium Carbonate-Vitamin D 600-400 MG-UNIT tablet Take 1 tablet by mouth daily.   clonazePAM 1 MG tablet Commonly known as:  KLONOPIN Take 1 tablet (1 mg total) by mouth 2 (two) times daily as needed for anxiety.   cyclobenzaprine 10 MG tablet Commonly known as:  FLEXERIL TAKE 1 TABLET BY MOUTH AT BEDTIME FOR MUSCLE SPASMS   dexamethasone 4 MG tablet Commonly known as:  DECADRON Take 4 mg by mouth 2 (two) times daily with a meal. Take 1 tablet bid for 5 days then take 1 tablet daily for 5 days then take 1 tablet every other day then  stop taking.  Called in to [facility].   doxycycline 100 MG tablet Commonly known as:  ADOXA Take 100 mg by mouth 2 (two) times daily.   fentaNYL 100 MCG/HR Commonly known as:  DURAGESIC - dosed mcg/hr Place 400 mcg onto the skin every 3 (three) days. Rotate   furosemide 40 MG tablet Commonly known as:  LASIX Take 1 tablet (40 mg total) by mouth daily.   hydroxypropyl methylcellulose / hypromellose 2.5 % ophthalmic solution Commonly known as:  ISOPTO TEARS / GONIOVISC Place 1 drop into both eyes 4 (four) times daily as needed (dry eyes).   lidocaine-prilocaine cream Commonly known as:  EMLA Apply 1 application topically as needed (prior to chemo). For port (2.5%/2/5%)   ondansetron 8 MG tablet Commonly known as:  ZOFRAN TAKE 1 TABLET BY MOUTH EVERY 12 HOURS AS NEEDED   Oxycodone HCl 20 MG Tabs Take 80  mg by mouth every 3 (three) hours.   potassium chloride SA 20 MEQ tablet Commonly known as:  K-DUR,KLOR-CON Take 20 mEq by mouth daily.   pregabalin 100 MG capsule Commonly known as:  LYRICA TAKE 100 mg CAPSULE BY MOUTH DAILY FOR PAIN   tamoxifen 20 MG tablet Commonly known as:  NOLVADEX Take 20 mg by mouth.   THERA Tabs Take 1 tablet by mouth at bedtime.   triamterene-hydrochlorothiazide 37.5-25 MG tablet Commonly known as:  MAXZIDE-25 Take 1 tablet by mouth every morning.   XARELTO 15 MG Tabs tablet Generic drug:  Rivaroxaban Take 15 mg by mouth 2 (two) times daily with a meal.   rivaroxaban 20 MG Tabs tablet Commonly known as:  XARELTO Take 1 tablet (20 mg total) by mouth daily with supper.          Objective:    BP 115/71   Pulse 95   Temp 98.3 F (36.8 C) (Oral)   Ht 5\' 4"  (1.626 m)   BMI 25.88 kg/m   Allergies  Allergen Reactions  . Penicillins Swelling  . Sulfasalazine Rash  . Sulfonamide Derivatives Rash  . Latex     adhesive  . Tape Other (See Comments)    Adhesive tape- RASH, SWELLING   All adhesives    Wt Readings from Last 3 Encounters:  03/10/17 150 lb 12.7 oz (68.4 kg)  12/08/16 158 lb (71.7 kg)  10/09/16 153 lb 12.8 oz (69.8 kg)    Physical Exam  Constitutional: She is oriented to person, place, and time. She appears well-developed and well-nourished.  HENT:  Head: Normocephalic and atraumatic.  Eyes: Conjunctivae and EOM are normal. Pupils are equal, round, and reactive to light.  Cardiovascular: Normal rate, regular rhythm, normal heart sounds and intact distal pulses.  Pulmonary/Chest: Effort normal and breath sounds normal.  Abdominal: Soft. Bowel sounds are normal.  Neurological: She is alert and oriented to person, place, and time. She has normal reflexes.  Skin: Skin is warm and dry. No rash noted.  Psychiatric: She has a normal mood and affect. Her behavior is normal. Judgment and thought content normal.          Assessment & Plan:   1. Acute deep vein thrombosis (DVT) of popliteal vein of left lower extremity (HCC) - XARELTO 15 MG TABS tablet; Take 15 mg by mouth 2 (two) times daily with a meal.; Refill: 0 - rivaroxaban (XARELTO) 20 MG TABS tablet; Take 1 tablet (20 mg total) by mouth daily with supper.  Dispense: 30 tablet; Refill: 5  2. Paraplegia ([place])  3. 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  Follow up plan: Return in about 3 months (around 07/14/2017) for recheck.  Educational handout given for [place] PA-C [place] [address]  [place],  [address] [phone]   04/13/2017, 12:59 PM

## 2017-04-16 ENCOUNTER — Other Ambulatory Visit: Payer: Self-pay | Admitting: Physician Assistant

## 2017-04-22 ENCOUNTER — Ambulatory Visit: Payer: Medicare Other | Admitting: Physician Assistant

## 2017-05-01 ENCOUNTER — Ambulatory Visit (INDEPENDENT_AMBULATORY_CARE_PROVIDER_SITE_OTHER): Payer: Medicare Other | Admitting: Internal Medicine

## 2017-05-01 ENCOUNTER — Other Ambulatory Visit: Payer: Self-pay

## 2017-05-01 VITALS — BP 101/67 | HR 101 | Ht 60.0 in | Wt 151.0 lb

## 2017-05-01 DIAGNOSIS — I441 Atrioventricular block, second degree: Secondary | ICD-10-CM

## 2017-05-01 DIAGNOSIS — C50919 Malignant neoplasm of unspecified site of unspecified female breast: Secondary | ICD-10-CM

## 2017-05-01 DIAGNOSIS — C7951 Secondary malignant neoplasm of bone: Secondary | ICD-10-CM | POA: Diagnosis not present

## 2017-05-01 NOTE — Progress Notes (Signed)
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 female who presents today for routine electrophysiology followup.  Since last being seen in our clinic, the patient reports doing reasonably well.  She was recently hospitalized at [facility] with DVT of the L popliteal vein.  She is now on xarelto.  She states that she has spinal mets and is very limited in use of her legs.  She can stand but not walk.  This has lead to significant dependant edema.  Today, she denies symptoms of palpitations, chest pain, shortness of breath,  dizziness, presyncope, or syncope.  The patient is otherwise without complaint today.   Past Medical History:  Diagnosis Date  . Anemia   . Arthritis   . AV block, Mobitz 2 11/14/09   s/p MDT Revo PPM by JA  . Bipolar disorder ([place])   . Breast cancer metastasized to bone ([place]) 08/16/2012  . Cancer ([place])    right breast/LAST CHEOM 10/07/11  . Chills   . Chronic pain 12/13/2015  . Easy bruising   . Elevated LFTs    hx  . Fever   . Fibromyalgia   . Generalized headaches   . GERD (gastroesophageal reflux disease)   . Hypertension   . Morbid obesity with body mass index of 40.0-44.9 in adult ([place]) 10/22/2011   BMI 44.26    . Osteoporosis   . Pacemaker   . Sleep apnea    STOP BANG SCORE 4  . Weakness    Past Surgical History:  Procedure Laterality Date  . BILATERAL KNEE ARTHROSCOPY    . BREAST SURGERY  2012,12   lumpectomy,mastectomy bil  . CHOLECYSTECTOMY    . ECTOPIC PREGNANCY SURGERY     x2  . gastric bypass surgery    . left ovary and fallopian tube removed due to chronic pain    . mastectomy  2013   bilateral  . MULTIPLE EXTRACTIONS WITH ALVEOLOPLASTY N/A 06/04/2012   Procedure: MULTIPLE EXTRACION WITH ALVEOLOPLASTY EXTRACT: 4, 5, 8, 9, 10, 13, 14 ,28;  Surgeon: Gae Bon, DDS;  Location: [facility];  Service: Oral Surgery;  Laterality: N/A;  . PACEMAKER INSERTION  2011  . plastic surgery on  face     for ptosis of rt. eyelid  . PORTACATH PLACEMENT     power port - right  . PPM  11/14/09   MDT Revo implanted by Dr Rayann Heman for syncope/ Mobitz II AV block    ROS- all systems are reviewed and negative except as per HPI above  Current Outpatient Medications  Medication Sig Dispense Refill  . Calcium Carbonate-Vitamin D 600-400 MG-UNIT per tablet Take 1 tablet by mouth daily.     . clonazePAM (KLONOPIN) 1 MG tablet Take 1 tablet (1 mg total) by mouth 2 (two) times daily as needed for anxiety. 30 tablet 2  . cyclobenzaprine (FLEXERIL) 10 MG tablet TAKE ONE TABLET BY MOUTH AT BEDTIME FOR MUSCLE SPASMS 30 tablet 0  . fentaNYL (DURAGESIC - DOSED MCG/HR) 100 MCG/HR Place 400 mcg onto the skin every 3 (three) days. Rotate    . furosemide (LASIX) 40 MG tablet Take 1 tablet (40 mg total) by mouth daily. 30 tablet 0  . hydroxypropyl methylcellulose (ISOPTO TEARS) 2.5 % ophthalmic solution Place 1 drop into both eyes 4 (four) times daily as needed (dry eyes).    Marland Kitchen lidocaine-prilocaine (EMLA) cream Apply 1 application topically as needed (prior to  chemo). For port (2.5%/2/5%)    . Multiple Vitamin (THERA) TABS Take 1 tablet by mouth at bedtime.     . ondansetron (ZOFRAN) 8 MG tablet TAKE 1 TABLET BY MOUTH EVERY 12 HOURS AS NEEDED (Patient taking differently: TAKE 8 mg TABLET BY MOUTH EVERY 12 HOURS AS NEEDED) 14 tablet 0  . Oxycodone HCl 20 MG TABS Take 80 mg by mouth every 3 (three) hours.     . potassium chloride SA (K-DUR,KLOR-CON) 20 MEQ tablet Take 20 mEq by mouth daily.     . pregabalin (LYRICA) 100 MG capsule TAKE 100 mg CAPSULE BY MOUTH DAILY FOR PAIN (Patient taking differently: Take 100 mg by mouth 2 (two) times daily. TAKE 100 mg CAPSULE BY MOUTH DAILY FOR PAIN)    . rivaroxaban (XARELTO) 20 MG TABS tablet Take 1 tablet (20 mg total) by mouth daily with supper. 30 tablet 5  . tamoxifen (NOLVADEX) 20 MG tablet Take 20 mg by mouth.    . triamterene-hydrochlorothiazide (MAXZIDE-25)  37.5-25 MG tablet Take 1 tablet by mouth every morning. 30 tablet 5  . XARELTO 15 MG TABS tablet Take 15 mg by mouth 2 (two) times daily with a meal.  0   No current facility-administered medications for this visit.     Physical Exam: Vitals:   05/01/17 1440  BP: 101/67  Pulse: (!) 101  SpO2: 100%  Weight: 151 lb (68.5 kg)  Height: 5' (1.524 m)    GEN- The patient is chronically ill appearing, alert and oriented x 3 today.   Head- normocephalic, atraumatic Eyes-  Sclera clear, conjunctiva pink Ears- hearing intact Oropharynx- clear Lungs- Clear to ausculation bilaterally, normal work of breathing Chest- pacemaker pocket is well healed Heart- Regular rate and rhythm, 2/6 SEM LLSB GI- soft, NT, ND, + BS Extremities- no clubbing, cyanosis, +3 dependant edema  Pacemaker interrogation- reviewed in detail today,  See PACEART report  ekg tracing ordered today is personally reviewed and shows sinus tachycardia  Assessment and Plan:  1. Symptomatic second degree AV block heart block Normal pacemaker function See Pace Art report No changes today] R waves are chronically low.  We are following conservatively  2. Dependant edema Support hose Sodium restriction Echo is pending  3. DVT On xarelto  [facility] Return to see me in a year  Thom[place] Grayer MD, [facility] 05/01/2017 2:49 PM

## 2017-05-01 NOTE — Patient Instructions (Signed)
Medication Instructions:  Continue all current medications.  Labwork: none  Testing/Procedures: none  Follow-Up: Your physician wants you to follow up in:  1 year.  You will receive a reminder letter in the mail one-two months in advance.  If you don't receive a letter, please call our office to schedule the follow up appointment.  - Dr. Rayann Heman.   Any Other Special Instructions Will Be Listed Below (If Applicable). Remote monitoring is used to monitor your Pacemaker of ICD from home. This monitoring reduces the number of office visits required to check your device to one time per year. It allows [place] to keep an eye on the functioning of your device to ensure it is working properly. You are scheduled for a device check from home on 08/03/2017. You may send your transmission at any time that day. If you have a wireless device, the transmission will be sent automatically. After your physician reviews your transmission, you will receive a postcard with your next transmission date.  If you need a refill on your cardiac medications before your next appointment, please call your pharmacy.

## 2017-05-04 ENCOUNTER — Encounter: Payer: Self-pay | Admitting: Oncology

## 2017-05-04 ENCOUNTER — Ambulatory Visit
Admission: RE | Admit: 2017-05-04 | Discharge: 2017-05-04 | Disposition: A | Discharge status: Home / Self Care | Payer: Medicare Other | Source: Ambulatory Visit | Attending: Radiation Oncology | Admitting: Radiation Oncology

## 2017-05-04 ENCOUNTER — Telehealth: Payer: Self-pay | Admitting: Oncology

## 2017-05-04 ENCOUNTER — Encounter: Payer: Self-pay | Admitting: *Deleted

## 2017-05-04 DIAGNOSIS — C7951 Secondary malignant neoplasm of bone: Principal | ICD-10-CM

## 2017-05-04 DIAGNOSIS — C50911 Malignant neoplasm of unspecified site of right female breast: Secondary | ICD-10-CM

## 2017-05-05 LAB — CUP PACEART INCLINIC DEVICE CHECK
Implantable Lead Implant Date: 20111005
Implantable Pulse Generator Implant Date: 20111005
MDC IDC LEAD IMPLANT DT: 20111005
MDC IDC LEAD LOCATION: 753859
MDC IDC LEAD LOCATION: 753860
MDC IDC SESS DTM: 20190326074017

## 2017-05-06 ENCOUNTER — Other Ambulatory Visit: Payer: Self-pay | Admitting: Internal Medicine

## 2017-05-06 DIAGNOSIS — T451X5A Adverse effect of antineoplastic and immunosuppressive drugs, initial encounter: Secondary | ICD-10-CM

## 2017-05-06 DIAGNOSIS — R600 Localized edema: Secondary | ICD-10-CM

## 2017-05-06 DIAGNOSIS — I441 Atrioventricular block, second degree: Secondary | ICD-10-CM

## 2017-05-06 DIAGNOSIS — Z950 Presence of cardiac pacemaker: Secondary | ICD-10-CM

## 2017-05-07 ENCOUNTER — Other Ambulatory Visit: Payer: Self-pay | Admitting: Family Medicine

## 2017-05-07 NOTE — Addendum Note (Signed)
Addended by: Laurine Blazer on: 05/07/2017 10:06 AM   Modules accepted: Orders

## 2017-05-13 ENCOUNTER — Other Ambulatory Visit: Payer: Self-pay | Admitting: Internal Medicine

## 2017-05-13 DIAGNOSIS — T451X5A Adverse effect of antineoplastic and immunosuppressive drugs, initial encounter: Secondary | ICD-10-CM

## 2017-05-13 DIAGNOSIS — I4891 Unspecified atrial fibrillation: Secondary | ICD-10-CM

## 2017-05-20 ENCOUNTER — Other Ambulatory Visit: Payer: Medicare Other

## 2017-05-28 ENCOUNTER — Ambulatory Visit: Payer: Medicare Other

## 2017-05-28 ENCOUNTER — Other Ambulatory Visit: Payer: Self-pay

## 2017-05-28 DIAGNOSIS — I4891 Unspecified atrial fibrillation: Secondary | ICD-10-CM

## 2017-05-28 DIAGNOSIS — T451X5A Adverse effect of antineoplastic and immunosuppressive drugs, initial encounter: Secondary | ICD-10-CM

## 2017-06-08 ENCOUNTER — Ambulatory Visit: Payer: Medicare Other | Admitting: Physician Assistant

## 2017-06-09 ENCOUNTER — Ambulatory Visit: Payer: Medicare Other | Admitting: Physician Assistant

## 2017-06-10 ENCOUNTER — Telehealth (HOSPITAL_COMMUNITY): Payer: Self-pay | Admitting: Physician Assistant

## 2017-06-10 NOTE — Telephone Encounter (Signed)
06/10/17  [facility] wasn't notified to schedule with Ashlee Moore so we had to reschedule to a date and time Ashlee Moore could come

## 2017-06-11 ENCOUNTER — Ambulatory Visit (HOSPITAL_COMMUNITY): Payer: Medicare Other | Admitting: Occupational Therapy

## 2017-06-11 ENCOUNTER — Encounter (HOSPITAL_COMMUNITY): Payer: Self-pay

## 2017-06-12 ENCOUNTER — Other Ambulatory Visit: Payer: Self-pay | Admitting: Physician Assistant

## 2017-06-15 ENCOUNTER — Ambulatory Visit: Payer: Medicare Other | Admitting: Physician Assistant

## 2017-06-17 ENCOUNTER — Other Ambulatory Visit: Payer: Self-pay | Admitting: Physician Assistant

## 2017-06-18 ENCOUNTER — Ambulatory Visit (HOSPITAL_COMMUNITY): Payer: Medicare Other | Attending: Occupational Therapy | Admitting: Occupational Therapy

## 2017-07-01 ENCOUNTER — Ambulatory Visit: Payer: Medicare Other | Admitting: Physician Assistant

## 2017-07-01 ENCOUNTER — Ambulatory Visit: Payer: Self-pay | Admitting: Physician Assistant

## 2017-07-03 ENCOUNTER — Encounter: Payer: Self-pay | Admitting: Physician Assistant

## 2017-07-05 ENCOUNTER — Other Ambulatory Visit: Payer: Self-pay | Admitting: Physician Assistant

## 2017-07-07 NOTE — Telephone Encounter (Signed)
Last seen 04/13/17

## 2017-07-21 ENCOUNTER — Ambulatory Visit: Payer: Medicare Other | Admitting: Physician Assistant

## 2017-07-24 ENCOUNTER — Ambulatory Visit (INDEPENDENT_AMBULATORY_CARE_PROVIDER_SITE_OTHER): Payer: Medicare Other | Admitting: Physician Assistant

## 2017-07-24 ENCOUNTER — Encounter: Payer: Self-pay | Admitting: Physician Assistant

## 2017-07-24 VITALS — BP 93/64 | HR 90 | Temp 97.2°F | Ht 60.0 in | Wt 135.0 lb

## 2017-07-24 DIAGNOSIS — R600 Localized edema: Secondary | ICD-10-CM

## 2017-07-24 DIAGNOSIS — C50911 Malignant neoplasm of unspecified site of right female breast: Secondary | ICD-10-CM

## 2017-07-24 DIAGNOSIS — L03116 Cellulitis of left lower limb: Secondary | ICD-10-CM

## 2017-07-24 DIAGNOSIS — C7951 Secondary malignant neoplasm of bone: Secondary | ICD-10-CM | POA: Diagnosis not present

## 2017-07-24 DIAGNOSIS — G8220 Paraplegia, unspecified: Secondary | ICD-10-CM

## 2017-07-24 MED ORDER — ZOLPIDEM TARTRATE 10 MG PO TABS: 10.0000 mg | ORAL_TABLET | Freq: Every evening | ORAL | 1 refills | Status: DC | PRN

## 2017-07-24 MED ORDER — CEPHALEXIN 500 MG PO CAPS: 500.0000 mg | ORAL_CAPSULE | Freq: Four times a day (QID) | ORAL | 0 refills | Status: DC

## 2017-07-24 NOTE — Progress Notes (Signed)
BP 93/64 (BP Location: Left Wrist, Patient Position: Sitting, Cuff Size: Small)   Pulse 90   Temp (!) 97.2 F (36.2 C) (Oral)   Ht 5' (1.524 m)   Wt 135 lb (61.2 kg) Comment: patient reported, paraplegic  BMI 26.37 kg/m     Subjective:    Patient ID: Ashlee Moore, female    DOB: 1960-06-28, 57 y.o.   MRN: [id number]  HPI: Ashlee Moore is a 57 y.o. female presenting on 07/24/2017 for Hypertension (3 month f/u); Poor Circulation (wound left lower leg); and Mass (abd mass. Has had a CT that showed fatty tissure. Concerned that there is one behind that one. )  This patient comes in for a face-to-face concerning her severe medical conditions and the need for home health care.  The patient does have metastatic breast cancer with metastases to the bone, weakness and difficulty in walking.  Her legs are very weak.  She does have metastases to the spine.  She is primarily wheelchair-bound.  She has a brother that comes in the looks for her.  She does have a hospital bed at home.  This is been very beneficial.  There was a mixup with her insurance in the last few months and home health had to stop going.  Now her insurance is back in place and she knows that when she was doing services through [facility] home [facility] she had much more mobility and strength.  She is having chronic pain related to the cancer, she also is having lots of edema in her lower legs with associated cellulitis.  I would like an order to be placed for home health services including PT, OT, Worker.  She does also needs supplies of wipes, checks, diapers, and Ensure.    Past Medical History:  Diagnosis Date  . Anemia   . Arthritis   . AV block, Mobitz 2 11/14/09   s/p MDT Revo PPM by JA  . Bipolar disorder ([place])   . Breast cancer metastasized to bone ([place]) 08/16/2012  . Cancer ([place])    right breast/LAST CHEOM 10/07/11  . Chills   . Chronic pain 12/13/2015  . Easy bruising   . Elevated LFTs    hx  . Fever   . Fibromyalgia     . Generalized headaches   . GERD (gastroesophageal reflux disease)   . History of radiation therapy 03/07/2017 to 03/30/2017   The L-Spine was treated to 6 Gy in 2 fractions of 3 Gy (emergency treatment over the weekend), The L-Spine was boosted to 24 Gy in 8 fractions of 3 Gy (30 Gy total)  . Hypertension   . Morbid obesity with body mass index of 40.0-44.9 in adult ([place]) 10/22/2011   BMI 44.26    . Osteoporosis   . Pacemaker   . Sleep apnea    STOP BANG SCORE 4  . Weakness    Relevant past medical, surgical, family and social history reviewed and updated as indicated. Interim medical history since our last visit reviewed. Allergies and medications reviewed and updated. DATA REVIEWED: CHART IN EPIC  Family History reviewed for pertinent findings.  Review of Systems  Constitutional: Positive for activity change and fatigue. Negative for fever.  HENT: Negative.   Eyes: Negative.   Respiratory: Negative.  Negative for cough.   Cardiovascular: Positive for leg swelling. Negative for chest pain and palpitations.  Gastrointestinal: Negative.  Negative for abdominal pain.  Endocrine: Negative.   Genitourinary: Negative.  Negative for dysuria.  Musculoskeletal: Positive for arthralgias, back pain and joint swelling.  Skin: Negative.   Neurological: Positive for tremors, weakness and numbness.    Allergies as of 07/24/2017      Reactions   Penicillins Swelling   Sulfasalazine Rash   Sulfonamide Derivatives Rash   Latex    adhesive   Tape Other (See Comments)   Adhesive tape- RASH, SWELLING All adhesives      Medication List        Accurate as of 07/24/17  4:05 PM. Always use your most recent med list.          Calcium Carbonate-Vitamin D 600-400 MG-UNIT tablet Take 1 tablet by mouth daily.   cephALEXin 500 MG capsule Commonly known as:  KEFLEX Take 1 capsule (500 mg total) by mouth 4 (four) times daily.   clonazePAM 1 MG tablet Commonly known as:  KLONOPIN TAKE 1  TABLET BY MOUTH TWICE DAILY AS NEEDED FOR ANXIETY   cyclobenzaprine 10 MG tablet Commonly known as:  FLEXERIL TAKE 1 TABLET BY MOUTH AT BEDTIME FOR MUSCLE SPASM   fentaNYL 100 MCG/HR Commonly known as:  DURAGESIC - dosed mcg/hr Place 400 mcg onto the skin every 3 (three) days. Rotate   furosemide 40 MG tablet Commonly known as:  LASIX Take 1 tablet (40 mg total) by mouth daily.   hydroxypropyl methylcellulose / hypromellose 2.5 % ophthalmic solution Commonly known as:  ISOPTO TEARS / GONIOVISC Place 1 drop into both eyes 4 (four) times daily as needed (dry eyes).   lidocaine-prilocaine cream Commonly known as:  EMLA Apply 1 application topically as needed (prior to chemo). For port (2.5%/2/5%)   ondansetron 8 MG tablet Commonly known as:  ZOFRAN TAKE 1 TABLET BY MOUTH EVERY 12 HOURS AS NEEDED   Oxycodone HCl 20 MG Tabs Take 80 mg by mouth every 3 (three) hours.   potassium chloride SA 20 MEQ tablet Commonly known as:  K-DUR,KLOR-CON Take 20 mEq by mouth daily.   pregabalin 100 MG capsule Commonly known as:  LYRICA TAKE ONE CAPSULE BY MOUTH TWICE DAILY AS NEEDED FOR PAIN   rivaroxaban 20 MG Tabs tablet Commonly known as:  XARELTO Take 1 tablet (20 mg total) by mouth daily with supper.   tamoxifen 20 MG tablet Commonly known as:  NOLVADEX Take 20 mg by mouth.   THERA Tabs Take 1 tablet by mouth at bedtime.   zolpidem 10 MG tablet Commonly known as:  AMBIEN Take 1 tablet (10 mg total) by mouth at bedtime as needed for sleep.          Objective:    BP 93/64 (BP Location: Left Wrist, Patient Position: Sitting, Cuff Size: Small)   Pulse 90   Temp (!) 97.2 F (36.2 C) (Oral)   Ht 5' (1.524 m)   Wt 135 lb (61.2 kg) Comment: patient reported, paraplegic  BMI 26.37 kg/m    Allergies  Allergen Reactions  . Penicillins Swelling  . Sulfasalazine Rash  . Sulfonamide Derivatives Rash  . Latex     adhesive  . Tape Other (See Comments)    Adhesive tape- RASH,  SWELLING   All adhesives    Wt Readings from Last 3 Encounters:  07/24/17 135 lb (61.2 kg)  05/01/17 151 lb (68.5 kg)  03/10/17 150 lb 12.7 oz (68.4 kg)    Physical Exam  Constitutional: She is oriented to person, place, and time. She appears well-developed and well-nourished.  HENT:  Head: Normocephalic and atraumatic.  Right Ear: Tympanic membrane,  external ear and ear canal normal.  Left Ear: Tympanic membrane, external ear and ear canal normal.  Nose: Nose normal. No rhinorrhea.  Mouth/Throat: Oropharynx is clear and moist and mucous membranes are normal. No oropharyngeal exudate or posterior oropharyngeal erythema.  Eyes: Pupils are equal, round, and reactive to light. Conjunctivae and EOM are normal.  Neck: Normal range of motion. Neck supple.  Cardiovascular: Normal rate, regular rhythm, normal heart sounds and intact distal pulses.  Pulmonary/Chest: Effort normal and breath sounds normal.  Abdominal: Soft. Bowel sounds are normal.  Neurological: She is alert and oriented to person, place, and time. She has normal reflexes.  Skin: Skin is warm and dry. No rash noted.     Bilateral severe edema in the legs.  Left leg with red ulcerated area on the shin.  Mild weeping.  There is some surrounding erythema.  Psychiatric: She has a normal mood and affect. Her behavior is normal. Judgment and thought content normal.    Results for orders placed or performed in visit on 05/01/17  CUP PACEART [facility] DEVICE CHECK  Result Value Ref Range   Pulse Generator Manufacturer MERM    Date Time Interrogation Session [id number]    Pulse Gen Model [id number] Revo MRI    Pulse Gen Serial Number [id number]    Clinic Name Datil    Implantable Pulse Generator Type Implantable Pulse Generator    Implantable Pulse Generator Implant Date 15945859    Implantable Lead Manufacturer MERM    Implantable Lead Model 5086MRI CapSureFix MRI SureScan    Implantable Lead Serial Number  [id number]    Implantable Lead Implant Date 29244628    Implantable Lead Location Detail 1 APPENDAGE    Implantable Lead Location [id number]    Implantable Lead Manufacturer MERM    Implantable Lead Model 5086MRI CapSureFix MRI SureScan    Implantable Lead Serial Number [id number]    Implantable Lead Implant Date [id number]    Implantable Lead Location Detail 1 APEX    Implantable Lead Location [id number]    Eval Rhythm SR 95bpm       Assessment & Plan:   1. Carcinoma of right breast metastatic to bone [facility]) *Referral to home health care**  2. Paraplegia ([place]) *Referral to home health care  3. Metastatic cancer to spine [facility]) *Referral to home health care  4. Localized edema *Referral to home health care  5. Cellulitis of left lower extremity - cephALEXin (KEFLEX) 500 MG capsule; Take 1 capsule (500 mg total) by mouth 4 (four) times daily.  Dispense: 40 capsule; Refill: 0   Continue all other maintenance medications as listed above.  Follow up plan: Return in about 4 months (around 11/23/2017).  Educational handout given for [place] PA-C [place] [address]  [place], [place] [address] [phone]   07/24/2017, 4:05 PM

## 2017-07-27 ENCOUNTER — Telehealth: Payer: Self-pay | Admitting: Internal Medicine

## 2017-07-27 NOTE — Telephone Encounter (Signed)
Spoke with Dr. Jacquiline Doe office  [phone] office who stated that last pts last echo pts EF was reduced and wanted pt to f/u with cardiology regarding this. Records of last OV with Dr. Janae Sauce along with Echo results to be faxed over. Direct number to device clinic given. Also spoke with pt who is agreeable to apt on 08/03/17 @ 4:30pm

## 2017-07-27 NOTE — Telephone Encounter (Signed)
Was told to contact Dr Rayann Heman.  Stated that she was told to see Dr Rayann Heman before August 17, 2017 due to needing clearance for chemo.    Can you advise on where to schedule her

## 2017-07-29 ENCOUNTER — Other Ambulatory Visit: Payer: Self-pay

## 2017-07-29 ENCOUNTER — Other Ambulatory Visit: Payer: Self-pay | Admitting: Physician Assistant

## 2017-07-29 ENCOUNTER — Encounter (HOSPITAL_COMMUNITY): Payer: Self-pay | Admitting: Specialist

## 2017-07-29 ENCOUNTER — Ambulatory Visit (HOSPITAL_COMMUNITY): Payer: Medicare Other | Attending: Internal Medicine | Admitting: Specialist

## 2017-07-29 DIAGNOSIS — I82432 Acute embolism and thrombosis of left popliteal vein: Secondary | ICD-10-CM

## 2017-07-29 DIAGNOSIS — G8220 Paraplegia, unspecified: Secondary | ICD-10-CM

## 2017-07-29 DIAGNOSIS — R2689 Other abnormalities of gait and mobility: Secondary | ICD-10-CM

## 2017-07-29 DIAGNOSIS — C50911 Malignant neoplasm of unspecified site of right female breast: Secondary | ICD-10-CM

## 2017-07-29 DIAGNOSIS — M6281 Muscle weakness (generalized): Secondary | ICD-10-CM | POA: Diagnosis present

## 2017-07-29 DIAGNOSIS — L03116 Cellulitis of left lower limb: Secondary | ICD-10-CM

## 2017-07-29 DIAGNOSIS — R600 Localized edema: Secondary | ICD-10-CM

## 2017-07-29 DIAGNOSIS — C7951 Secondary malignant neoplasm of bone: Principal | ICD-10-CM

## 2017-07-29 NOTE — Therapy (Addendum)
[place], [place], [address] Phone: [phone]   Fax:  [phone]  Occupational Therapy Evaluation  Patient Details  Name: Ashlee Moore MRN: [id number] Date of Birth: August 27, 1960 No data recorded  Encounter Date: 07/29/2017  OT End of Session - 07/29/17 1020    Visit Number  1    Number of Visits  1    Authorization Type  Medicare and Medicaid     OT Start Time  0955    OT Stop Time  1045    OT Time Calculation (min)  50 min    Activity Tolerance  Patient tolerated treatment well    Behavior During Therapy  [facility] for tasks assessed/performed       Past Medical History:  Diagnosis Date  . Anemia   . Arthritis   . AV block, Mobitz 2 11/14/09   s/p MDT Revo PPM by JA  . Bipolar disorder ([place])   . Breast cancer metastasized to bone ([place]) 08/16/2012  . Cancer ([place])    right breast/LAST CHEOM 10/07/11  . Chills   . Chronic pain 12/13/2015  . Easy bruising   . Elevated LFTs    hx  . Fever   . Fibromyalgia   . Generalized headaches   . GERD (gastroesophageal reflux disease)   . History of radiation therapy 03/07/2017 to 03/30/2017   The L-Spine was treated to 6 Gy in 2 fractions of 3 Gy (emergency treatment over the weekend), The L-Spine was boosted to 24 Gy in 8 fractions of 3 Gy (30 Gy total)  . Hypertension   . Morbid obesity with body mass index of 40.0-44.9 in adult ([place]) 10/22/2011   BMI 44.26    . Osteoporosis   . Pacemaker   . Sleep apnea    STOP BANG SCORE 4  . Weakness     Past Surgical History:  Procedure Laterality Date  . BILATERAL KNEE ARTHROSCOPY    . BREAST SURGERY  2012,12   lumpectomy,mastectomy bil  . CHOLECYSTECTOMY    . ECTOPIC PREGNANCY SURGERY     x2  . gastric bypass surgery    . left ovary and fallopian tube removed due to chronic pain    . mastectomy  2013   bilateral  . MULTIPLE EXTRACTIONS WITH ALVEOLOPLASTY N/A 06/04/2012   Procedure: MULTIPLE EXTRACION WITH  ALVEOLOPLASTY EXTRACT: 4, 5, 8, 9, 10, 13, 14 ,28;  Surgeon: Gae Bon, DDS;  Location: [facility];  Service: Oral Surgery;  Laterality: N/A;  . PACEMAKER INSERTION  2011  . plastic surgery on face     for ptosis of rt. eyelid  . PORTACATH PLACEMENT     power port - right  . PPM  11/14/09   MDT Revo implanted by Dr Rayann Heman for syncope/ Mobitz II AV block    There were no vitals filed for this visit.  Subjective Assessment - 07/29/17 1019    Currently in Pain?  Yes    Pain Score  8     Pain Location  -- all over body due to fibromyalgia    Pain Orientation  Other (Comment) global    Pain Descriptors / Indicators  Aching    Pain Type  Chronic pain    Pain Radiating Towards  all over body    Pain Onset  More than a month ago    Pain Frequency  Constant  07/29/2017  Ashlee Moore is a 57 year old female referred to occupational therapy for evaluation for a power wheelchair.  Ashlee Moore has a medical history that includes arthritis, fibromyalgia, bilateral knee replacements, breast cancer with metastatic travel to spine, double mastectomy, chronic pain, hypertension, GERD, osteoporosis, pacemaker, bipolar disorder, and portacath placement.  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  Approximately 3 months ago, she awoke and was paralyzed from her waist down.  She has now regained some sensation in her lower extremities.  However, she is not able to move her legs or ambulate without assistance.  Ashlee Moore reports 8/10 pain throughout her body on a daily basis due to her fibromyalgia.   Ashlee Moore lives by herself in a single wide trailer.  Her brother and sister in law live next door, and are available to offer her assistance with B/IADLs, transfers, and bed mobility.  She uses a standard wheelchair to move about her home, however, she is not able to propel  it with her arms due to limited shoulder mobility and cannot propel with her legs due to recent decline in leg mobility and strength.  She requires min assistance with stand and pivot transfers from her wheelchair to her bedside commode or to the bed.  She is not able to enter the bathroom due to the size of the doorframe.  She is not able to ambulate.  Past attempts of ambulation and independent transfers have resulted in frequent falls. Ashlee Moore receives moderate assistance with dressing, spongebathing, and with pantpull after toileting.   Ashlee Moore states that a power wheelchair would assist her with safety and mobility in her home.  Additionally it would allow her to move from room to room versus being confined to her bedroom.    A FULL PHYSICAL ASSESSMENT REVEALS THE FOLLOWING  Existing Equipment: standard wheelchair, walker, bedside commode Transfers: Ashlee Moore transferred from sit to stand and stand to sit with minimal assistance this date. Ambulation:  Ashlee Moore was unable to ambulate this date due to decreased leg mobility and poor dynamic standing balance. Balance:  static sitting balance is fair +, dynamic sitting balance is poor.  Static standing balance is poor, and dynamic standing balance is poor -.    Head and Neck: WNL  Trunk: WFL Pelvis: WNL  Hip: bilateral hip A/ROM is limited to 25%.   Knees: bilateral knee A/ROM is limited to 10%. Feet and Ankles:  bilateral foot and ankle A/ROM is 10%. Upper Extremities: Bilateral shoulder A/ROM is limited to 50%.  Elbow, wrist, and hand A/ROM is [facility].   Strength is 4-/5 in left upper extremity and 3+/5 in right upper extremity.  Lower Extremities: BLE strength is 1/5.   Weight Shifting Ability: Independent. Skin Integrity: Patient has moderate-max edema (questionable lymphedema) in her bilateral lower extremities.  She has an open wound on her left shin that is a result of excessive edema.   Activity Tolerance:  poor.  Patient fatigued  throughout session, often leaning forward to rest her head on the table.   Seating Recommendations:  Patient will benefit from a Quantam edge 3 power wheelchair.    GOALS/OBJECTIVE OF SEATING INTERVENTION  Recommendations:  Ashlee Moore  has functional deficits in the areas of mobility and self-care, which are a result of the above listed diagnosis.  Based on clinical judgement, I recommend the following equipment for Ashlee Moore:  R1540Garlon Hatchet 3 Power Wheelchair Patient has physical and cognitive capabilities to use the recommended power mobility device. Patient's home provides adequate access between rooms, maneuvering space, and surfaces for the operation of a power chair.  This power base is required in order to provide the patient with the medically necessary electronics and multiple power operations through the recommended hand control.  This chair is more maneuverable than comparably priced traditional power wheelchairs due to the drive wheels being directly at the center of gravity of the user.  This maneuverability is required to enable the patient to negotiate the tight turns, small spaces, and obstacles characteristic of the home environment.   E2311/E2377/E2313: Multiple Actuator Control Through Expandable Controller These electronics are sufficiently programmable to allow patient to operate the power chair safely within the home environment. The patient requires these controls to operate the power seat functions through the joystick, thus eliminating additional switches and the need to find additional access points, as they are proficient with controlling the power chair through the joystick. These controls will also allow for any further modifications/addition to the drive controls if the patient's condition should worsen.   G8676: Power Tilt and Recline Patient must rest in the recumbent position several times per day and transfers between the wheelchair and bed are very difficult. The  tilt and recline mechanism decreases the need for extensive assistance required for multiple transfers and allows patient to independently position self in the chair. The patient is at high risk for skin breakdown, and requires need to independently pressure relieve. The recline feature allows for full caregiver access, assisting in clothing/brief changes.  P9509/T2671: Headrest Pad w/  Multi Axis Hardware Patient requires the headrest to provide posterior support for head and neck while in a tilted position. Without a headrest, patient would experience strain in the neck muscles, reducing patient's capacity to tilt to relieve pressure and reposition.  I4580: [place] A swing away joystick mount allows patient to come closer to tables for meals and other activities. It also allows patient clear access to armrests when transferring.   D9833: Hy[place]ite Skin Protection Cushion. The patient spends most of the day in the seated position, and is at high risk for skin breakdown. This combination gel/foam skin protection cushion will provide appropriate pressure relief and skin protection, while also allowing for a breathable seating surface to avoid moisture build up.  A2505: Power Cent[place]ount Elevating Footrest This patient suffers from significant bilateral LE edema. The power elevating legrests will allow for patient to change elevation of LE's throughout the day, as well as open hip angle during reclined position for increased pressure distribution.  L9767: NF22 Batteries A pair of NF22 batteries are required to power the wheelchair, as well as power seat functions.   If you require any further information concerning Ashlee Moore's  positioning, independence or mobility needs; or any further information why a lesser device will not work, please do not hesitate to contact me at [facility]. [address] [place], [place] [address] [phone].    ___________________ __________  Penelope Galas, OTR/L     Date                           Plan - 07/29/17 1020    Occupational Profile and client history currently impacting functional performance  motivation to increase safety    Occupational performance deficits (Please refer to evaluation  for details):  ADL's;IADL's;Rest and Sleep;Work;Leisure;Social Participation    Rehab Potential  Poor    Current Impairments/barriers affecting progress:  multiple medical problems     OT Frequency  One time visit    OT Treatment/Interventions  Self-care/ADL training    Clinical Decision Making  Several treatment options, min-mod task modification necessary    Consulted and Agree with Plan of Care  Patient       Patient will benefit from skilled therapeutic intervention in order to improve the following deficits and impairments:  Pain, Decreased strength, Decreased range of motion, Difficulty walking, Decreased safety awareness  Visit Diagnosis: Other abnormalities of gait and mobility  Muscle weakness (generalized)    Problem List Patient Active Problem List   Diagnosis Date Noted  . Localized edema 07/24/2017  . Cellulitis of left lower extremity 07/24/2017  . Acute urinary retention 03/07/2017  . Metastatic cancer to spine ([place]) 03/06/2017  . Paraplegia ([place]) 03/06/2017  . Chronic pain due to neoplasm 10/09/2016  . Abdominal mass, RUQ (right upper quadrant) 09/29/2016  . Breast cancer metastasized to bone ([place]) 08/16/2012  . Breast cancer, right breast ([place]) 09/19/2011  . Mobitz type II atrioventricular block 11/09/2009    Vangie Bicker, [place], OTR/L [phone]  07/29/2017, 1:08 PM  [facility] [address] [place], [place], [address] Phone: [phone]   Fax:  [phone]  Name: CARIANN KINNAMON MRN: [id number] Date of Birth: Dec 01, 1960

## 2017-08-03 ENCOUNTER — Other Ambulatory Visit: Payer: Self-pay | Admitting: Physician Assistant

## 2017-08-03 ENCOUNTER — Encounter: Payer: Medicare Other | Admitting: *Deleted

## 2017-08-03 ENCOUNTER — Telehealth: Payer: Self-pay

## 2017-08-03 NOTE — Telephone Encounter (Signed)
I spoke with the patient about this. Patient states she does not have a home monitor. I told her [employer] would reach out to her about her monitor.

## 2017-08-05 ENCOUNTER — Ambulatory Visit (INDEPENDENT_AMBULATORY_CARE_PROVIDER_SITE_OTHER): Payer: Medicare Other | Admitting: Internal Medicine

## 2017-08-05 ENCOUNTER — Encounter: Payer: Self-pay | Admitting: Internal Medicine

## 2017-08-05 ENCOUNTER — Encounter: Payer: Self-pay | Admitting: Cardiology

## 2017-08-05 ENCOUNTER — Encounter: Payer: Medicare Other | Admitting: Internal Medicine

## 2017-08-05 VITALS — BP 110/76 | HR 89 | Ht 64.0 in

## 2017-08-05 DIAGNOSIS — C50919 Malignant neoplasm of unspecified site of unspecified female breast: Secondary | ICD-10-CM

## 2017-08-05 DIAGNOSIS — Z950 Presence of cardiac pacemaker: Secondary | ICD-10-CM

## 2017-08-05 DIAGNOSIS — C7951 Secondary malignant neoplasm of bone: Secondary | ICD-10-CM

## 2017-08-05 DIAGNOSIS — R600 Localized edema: Secondary | ICD-10-CM | POA: Diagnosis not present

## 2017-08-05 DIAGNOSIS — I441 Atrioventricular block, second degree: Secondary | ICD-10-CM | POA: Diagnosis not present

## 2017-08-05 NOTE — Progress Notes (Signed)
PCP: Ashlee Sleeper, PA-C Primary Cardiologist: none Primary EP:  Dr Ashlee Moore Oncologist:  Dr Ashlee Moore (office # [phone], fax [phone])  Ashlee Moore is a 57 y.o. female who presents today for routine electrophysiology followup.  Since last being seen in our clinic, the patient reports doing reasonably well.  She has advanced breast cancer.  She has been mostly non weight bearing for quite some time now and continues to decline.  + edema.  Stable SOB.  Today, she denies symptoms of palpitations, chest pain, dizziness, presyncope, or syncope.  The patient is otherwise without complaint today.   Past Medical History:  Diagnosis Date  . Anemia   . Arthritis   . AV block, Mobitz 2 11/14/09   s/p MDT Revo PPM by JA  . Bipolar disorder ([place])   . Breast cancer metastasized to bone ([place]) 08/16/2012  . Cancer ([place])    right breast/LAST CHEOM 10/07/11  . Chills   . Chronic pain 12/13/2015  . Easy bruising   . Elevated LFTs    hx  . Fever   . Fibromyalgia   . Generalized headaches   . GERD (gastroesophageal reflux disease)   . History of radiation therapy 03/07/2017 to 03/30/2017   The L-Spine was treated to 6 Gy in 2 fractions of 3 Gy (emergency treatment over the weekend), The L-Spine was boosted to 24 Gy in 8 fractions of 3 Gy (30 Gy total)  . Hypertension   . Morbid obesity with body mass index of 40.0-44.9 in adult ([place]) 10/22/2011   BMI 44.26    . Osteoporosis   . Pacemaker   . Sleep apnea    STOP BANG SCORE 4  . Weakness    Past Surgical History:  Procedure Laterality Date  . BILATERAL KNEE ARTHROSCOPY    . BREAST SURGERY  2012,12   lumpectomy,mastectomy bil  . CHOLECYSTECTOMY    . ECTOPIC PREGNANCY SURGERY     x2  . gastric bypass surgery    . left ovary and fallopian tube removed due to chronic pain    . mastectomy  2013   bilateral  . MULTIPLE EXTRACTIONS WITH ALVEOLOPLASTY N/A 06/04/2012   Procedure: MULTIPLE EXTRACION WITH ALVEOLOPLASTY EXTRACT: 4, 5,  8, 9, 10, 13, 14 ,28;  Surgeon: Ashlee Moore, DDS;  Location: [facility];  Service: Oral Surgery;  Laterality: N/A;  . PACEMAKER INSERTION  2011  . plastic surgery on face     for ptosis of rt. eyelid  . PORTACATH PLACEMENT     power port - right  . PPM  11/14/09   MDT Revo implanted by Dr Ashlee Moore for syncope/ Mobitz II AV block    ROS- all systems are reviewed and negative except as per HPI above  Current Outpatient Medications  Medication Sig Dispense Refill  . Calcium Carbonate-Vitamin D 600-400 MG-UNIT per tablet Take 1 tablet by mouth daily.     . cephALEXin (KEFLEX) 500 MG capsule Take 1 capsule (500 mg total) by mouth 4 (four) times daily. 40 capsule 0  . clonazePAM (KLONOPIN) 1 MG tablet TAKE 1 TABLET BY MOUTH TWICE DAILY AS NEEDED FOR ANXIETY 30 tablet 2  . cyclobenzaprine (FLEXERIL) 10 MG tablet TAKE 1 TABLET BY MOUTH AT BEDTIME FOR MUSCLE SPASM 30 tablet 0  . fentaNYL (DURAGESIC - DOSED MCG/HR) 100 MCG/HR Place 400 mcg onto the skin every 3 (three) days. Rotate    . furosemide (LASIX) 40 MG tablet Take 1 tablet (40 mg total)  by mouth daily. 30 tablet 0  . lidocaine-prilocaine (EMLA) cream Apply 1 application topically as needed (prior to chemo). For port (2.5%/2/5%)    . Multiple Vitamin (THERA) TABS Take 1 tablet by mouth at bedtime.     . ondansetron (ZOFRAN) 8 MG tablet TAKE 1 TABLET BY MOUTH EVERY 12 HOURS AS NEEDED (Patient taking differently: No sig reported) 14 tablet 0  . Oxycodone HCl 20 MG TABS Take 80 mg by mouth every 3 (three) hours.     . potassium chloride SA (K-DUR,KLOR-CON) 20 MEQ tablet Take 20 mEq by mouth daily.     . pregabalin (LYRICA) 100 MG capsule TAKE ONE CAPSULE BY MOUTH TWICE DAILY AS NEEDED FOR PAIN 60 capsule 5  . rivaroxaban (XARELTO) 20 MG TABS tablet Take 1 tablet (20 mg total) by mouth daily with supper. 30 tablet 5  . zolpidem (AMBIEN) 10 MG tablet Take 1 tablet (10 mg total) by mouth at bedtime as needed for sleep. 30 tablet 1  . tamoxifen  (NOLVADEX) 20 MG tablet Take 20 mg by mouth.     No current facility-administered medications for this visit.     Physical Exam: Vitals:   08/05/17 1438  BP: 110/76  Pulse: 89  SpO2: 98%  Height: 5\' 4"  (1.626 m)    GEN- The patient is ill appearing, alert and oriented x 3 today.   Head- normocephalic, atraumatic Eyes-  Sclera clear, conjunctiva pink Ears- hearing intact Oropharynx- clear Lungs- Clear to ausculation bilaterally, normal work of breathing Chest- pacemaker pocket is well healed Heart- Regular rate and rhythm  GI- soft, NT, ND, + BS Extremities- no clubbing, cyanosis, + dependant edema  Pacemaker interrogation- reviewed in detail today,  See PACEART report  ekg tracing ordered today is personally reviewed and shows sinus rhythm 89 bpm, LVH  Assessment and Plan:  1. Symptomatic second degree heart block Normal pacemaker function See Pace Art report No changes today  2. Chronic diastolic dysfunction Preserved EF by echo 05/28/17 EF55-60%, LVEDD 38 mm Hg Dr Jacquiline Moore has asked that I reassess her given that her prior echo showed EF 60%. Clinically, she is stable from a CV standpoint. I will ask Dr Ashlee Moore to compare his most recent echo to the one from last year to make sure that there is no decline. I would advise in the interim that the patient continue her chemotherapy.  We will reassess with an echo in 3 months.  3. Dependant edema Support hose advised  4. DVT On xarelto  [facility] As long as echo in 3 months is stable, I will see again in a year  Ashlee Grayer MD, [facility] 08/05/2017 3:01 PM

## 2017-08-05 NOTE — Patient Instructions (Signed)
Medication Instructions:  Your physician recommends that you continue on your current medications as directed. Please refer to the Current Medication list given to you today.  Labwork: None ordered.  Testing/Procedures: Your physician has requested that you have an echocardiogram. Echocardiography is a painless test that uses sound waves to create images of your heart. It provides your doctor with information about the size and shape of your heart and how well your heart's chambers and valves are working. This procedure takes approximately one hour. There are no restrictions for this procedure.  You will repeat your echocardiogram in 3 months in [place].  Follow-Up: Your physician wants you to follow-up in: one year with Dr. Rayann Heman in [place].   You will receive a reminder letter in the mail two months in advance. If you don't receive a letter, please call our office to schedule the follow-up appointment.  Remote monitoring is used to monitor your Pacemaker from home. This monitoring reduces the number of office visits required to check your device to one time per year. It allows [place] to keep an eye on the functioning of your device to ensure it is working properly. You are scheduled for a device check from home on 11/04/2017. You may send your transmission at any time that day. If you have a wireless device, the transmission will be sent automatically. After your physician reviews your transmission, you will receive a postcard with your next transmission date.  Any Other Special Instructions Will Be Listed Below (If Applicable).  If you need a refill on your cardiac medications before your next appointment, please call your pharmacy.

## 2017-08-18 ENCOUNTER — Ambulatory Visit (INDEPENDENT_AMBULATORY_CARE_PROVIDER_SITE_OTHER): Payer: Medicare Other

## 2017-08-18 DIAGNOSIS — G8220 Paraplegia, unspecified: Secondary | ICD-10-CM

## 2017-08-18 DIAGNOSIS — C7951 Secondary malignant neoplasm of bone: Secondary | ICD-10-CM

## 2017-08-18 DIAGNOSIS — I739 Peripheral vascular disease, unspecified: Secondary | ICD-10-CM

## 2017-08-18 DIAGNOSIS — C50919 Malignant neoplasm of unspecified site of unspecified female breast: Secondary | ICD-10-CM

## 2017-08-18 DIAGNOSIS — L03116 Cellulitis of left lower limb: Secondary | ICD-10-CM

## 2017-08-18 DIAGNOSIS — D649 Anemia, unspecified: Secondary | ICD-10-CM

## 2017-08-18 DIAGNOSIS — K219 Gastro-esophageal reflux disease without esophagitis: Secondary | ICD-10-CM

## 2017-08-18 DIAGNOSIS — M797 Fibromyalgia: Secondary | ICD-10-CM

## 2017-08-18 DIAGNOSIS — I82432 Acute embolism and thrombosis of left popliteal vein: Secondary | ICD-10-CM

## 2017-08-18 DIAGNOSIS — M810 Age-related osteoporosis without current pathological fracture: Secondary | ICD-10-CM

## 2017-08-18 DIAGNOSIS — M159 Polyosteoarthritis, unspecified: Secondary | ICD-10-CM

## 2017-08-18 DIAGNOSIS — F319 Bipolar disorder, unspecified: Secondary | ICD-10-CM

## 2017-08-18 DIAGNOSIS — I10 Essential (primary) hypertension: Secondary | ICD-10-CM

## 2017-08-18 DIAGNOSIS — I442 Atrioventricular block, complete: Secondary | ICD-10-CM

## 2017-08-20 NOTE — Addendum Note (Signed)
Addended by: Vangie Bicker on: 08/20/2017 10:53 AM   Modules accepted: Orders

## 2017-08-21 ENCOUNTER — Other Ambulatory Visit: Payer: Self-pay | Admitting: Physician Assistant

## 2017-08-21 DIAGNOSIS — L03116 Cellulitis of left lower limb: Secondary | ICD-10-CM

## 2017-09-09 ENCOUNTER — Other Ambulatory Visit: Payer: Self-pay | Admitting: Physician Assistant

## 2017-09-09 NOTE — Telephone Encounter (Signed)
Last seen 6./14/19  Ashlee Moore

## 2017-09-10 ENCOUNTER — Other Ambulatory Visit: Payer: Self-pay | Admitting: Physician Assistant

## 2017-10-06 ENCOUNTER — Other Ambulatory Visit (HOSPITAL_COMMUNITY): Payer: Self-pay | Admitting: Internal Medicine

## 2017-10-06 ENCOUNTER — Other Ambulatory Visit: Payer: Self-pay | Admitting: *Deleted

## 2017-10-06 ENCOUNTER — Other Ambulatory Visit: Payer: Self-pay | Admitting: Physician Assistant

## 2017-10-06 DIAGNOSIS — I82432 Acute embolism and thrombosis of left popliteal vein: Secondary | ICD-10-CM

## 2017-10-06 MED ORDER — RIVAROXABAN 20 MG PO TABS: 20.0000 mg | ORAL_TABLET | Freq: Every day | ORAL | 1 refills | Status: AC

## 2017-10-06 NOTE — Telephone Encounter (Signed)
Fax received [facility] Triam/HCTZ 37.5-25 one qd Please advise no longer on med list

## 2017-10-06 NOTE — Telephone Encounter (Signed)
Ov 11/25/17

## 2017-10-06 NOTE — Telephone Encounter (Signed)
There are so many people touching her meds, I am not even sure what she is on. Can the pharamcy sent a current list. We had DC'd triamterence, and we have only filled lasix 2 times many months back

## 2017-10-06 NOTE — Telephone Encounter (Signed)
This request is coming from the pharmacy. I have called patient and asked her to call the office to confirm if she is taking

## 2017-10-08 ENCOUNTER — Other Ambulatory Visit (HOSPITAL_COMMUNITY): Payer: Self-pay | Admitting: Internal Medicine

## 2017-10-08 DIAGNOSIS — C7931 Secondary malignant neoplasm of brain: Secondary | ICD-10-CM

## 2017-10-13 ENCOUNTER — Other Ambulatory Visit: Payer: Self-pay | Admitting: Physician Assistant

## 2017-10-13 DIAGNOSIS — G894 Chronic pain syndrome: Secondary | ICD-10-CM

## 2017-10-13 DIAGNOSIS — C7951 Secondary malignant neoplasm of bone: Principal | ICD-10-CM

## 2017-10-13 DIAGNOSIS — C50911 Malignant neoplasm of unspecified site of right female breast: Secondary | ICD-10-CM

## 2017-10-22 ENCOUNTER — Ambulatory Visit (HOSPITAL_COMMUNITY): Payer: Medicare Other

## 2017-10-23 LAB — CUP PACEART INCLINIC DEVICE CHECK
Battery Voltage: 2.93 V
Brady Statistic AP VP Percent: 0.02 %
Brady Statistic AP VS Percent: 0.02 %
Brady Statistic AS VP Percent: 0.37 %
Brady Statistic AS VS Percent: 99.59 %
Brady Statistic RV Percent Paced: 0.39 %
Implantable Lead Implant Date: 20111005
Implantable Lead Location: 753859
Implantable Lead Location: 753860
Implantable Pulse Generator Implant Date: 20111005
Lead Channel Impedance Value: 432 Ohm
Lead Channel Pacing Threshold Amplitude: 0.5 V
Lead Channel Pacing Threshold Pulse Width: 0.4 ms
Lead Channel Pacing Threshold Pulse Width: 0.4 ms
Lead Channel Sensing Intrinsic Amplitude: 1.375 mV
Lead Channel Sensing Intrinsic Amplitude: 3.18 mV
Lead Channel Setting Pacing Amplitude: 2 V
Lead Channel Setting Pacing Amplitude: 2.5 V
Lead Channel Setting Pacing Pulse Width: 0.4 ms
MDC IDC LEAD IMPLANT DT: 20111005
MDC IDC MSMT LEADCHNL RA IMPEDANCE VALUE: 464 Ohm
MDC IDC MSMT LEADCHNL RV PACING THRESHOLD AMPLITUDE: 0.5 V
MDC IDC SESS DTM: 20190626185830
MDC IDC SET LEADCHNL RV SENSING SENSITIVITY: 0.6 mV
MDC IDC STAT BRADY RA PERCENT PACED: 0.04 %

## 2017-10-28 ENCOUNTER — Ambulatory Visit (HOSPITAL_COMMUNITY): Admission: RE | Admit: 2017-10-28 | Payer: Medicare Other | Source: Ambulatory Visit

## 2017-11-04 ENCOUNTER — Encounter: Payer: Medicare Other | Admitting: *Deleted

## 2017-11-04 ENCOUNTER — Other Ambulatory Visit: Payer: Self-pay | Admitting: Physician Assistant

## 2017-11-04 ENCOUNTER — Telehealth: Payer: Self-pay

## 2017-11-04 DIAGNOSIS — C50911 Malignant neoplasm of unspecified site of right female breast: Secondary | ICD-10-CM

## 2017-11-04 DIAGNOSIS — G894 Chronic pain syndrome: Secondary | ICD-10-CM

## 2017-11-04 DIAGNOSIS — C7951 Secondary malignant neoplasm of bone: Principal | ICD-10-CM

## 2017-11-04 NOTE — Telephone Encounter (Signed)
Attempted to confirm remote transmission with pt. No answer and was unable to leave a message.   

## 2017-11-05 ENCOUNTER — Ambulatory Visit (HOSPITAL_COMMUNITY): Admission: RE | Admit: 2017-11-05 | Payer: Medicare Other | Source: Ambulatory Visit

## 2017-11-06 ENCOUNTER — Encounter: Payer: Self-pay | Admitting: Cardiology

## 2017-11-11 ENCOUNTER — Emergency Department (HOSPITAL_COMMUNITY): Payer: Medicare Other

## 2017-11-11 ENCOUNTER — Inpatient Hospital Stay (HOSPITAL_COMMUNITY)
Admission: EM | Admit: 2017-11-11 | Discharge: 2017-11-11 | DRG: 872 | Disposition: A | Discharge status: Hospice | Payer: Medicare Other | Attending: Internal Medicine | Admitting: Internal Medicine

## 2017-11-11 ENCOUNTER — Inpatient Hospital Stay (HOSPITAL_COMMUNITY): Payer: Medicare Other

## 2017-11-11 ENCOUNTER — Encounter (HOSPITAL_COMMUNITY): Payer: Self-pay

## 2017-11-11 ENCOUNTER — Other Ambulatory Visit: Payer: Self-pay

## 2017-11-11 DIAGNOSIS — G8929 Other chronic pain: Secondary | ICD-10-CM | POA: Diagnosis present

## 2017-11-11 DIAGNOSIS — Z808 Family history of malignant neoplasm of other organs or systems: Secondary | ICD-10-CM

## 2017-11-11 DIAGNOSIS — Z9884 Bariatric surgery status: Secondary | ICD-10-CM | POA: Diagnosis not present

## 2017-11-11 DIAGNOSIS — L8995 Pressure ulcer of unspecified site, unstageable: Secondary | ICD-10-CM | POA: Diagnosis not present

## 2017-11-11 DIAGNOSIS — G894 Chronic pain syndrome: Secondary | ICD-10-CM

## 2017-11-11 DIAGNOSIS — Z880 Allergy status to penicillin: Secondary | ICD-10-CM

## 2017-11-11 DIAGNOSIS — L089 Local infection of the skin and subcutaneous tissue, unspecified: Secondary | ICD-10-CM

## 2017-11-11 DIAGNOSIS — Z882 Allergy status to sulfonamides status: Secondary | ICD-10-CM

## 2017-11-11 DIAGNOSIS — C7951 Secondary malignant neoplasm of bone: Secondary | ICD-10-CM | POA: Diagnosis present

## 2017-11-11 DIAGNOSIS — Z66 Do not resuscitate: Secondary | ICD-10-CM | POA: Diagnosis present

## 2017-11-11 DIAGNOSIS — R6520 Severe sepsis without septic shock: Secondary | ICD-10-CM

## 2017-11-11 DIAGNOSIS — Z87891 Personal history of nicotine dependence: Secondary | ICD-10-CM | POA: Diagnosis not present

## 2017-11-11 DIAGNOSIS — G8220 Paraplegia, unspecified: Secondary | ICD-10-CM | POA: Diagnosis present

## 2017-11-11 DIAGNOSIS — C50919 Malignant neoplasm of unspecified site of unspecified female breast: Secondary | ICD-10-CM | POA: Diagnosis present

## 2017-11-11 DIAGNOSIS — A419 Sepsis, unspecified organism: Principal | ICD-10-CM | POA: Diagnosis present

## 2017-11-11 DIAGNOSIS — I441 Atrioventricular block, second degree: Secondary | ICD-10-CM | POA: Diagnosis present

## 2017-11-11 DIAGNOSIS — Z803 Family history of malignant neoplasm of breast: Secondary | ICD-10-CM

## 2017-11-11 DIAGNOSIS — K5641 Fecal impaction: Secondary | ICD-10-CM | POA: Diagnosis present

## 2017-11-11 DIAGNOSIS — R911 Solitary pulmonary nodule: Secondary | ICD-10-CM | POA: Diagnosis present

## 2017-11-11 DIAGNOSIS — Z825 Family history of asthma and other chronic lower respiratory diseases: Secondary | ICD-10-CM

## 2017-11-11 DIAGNOSIS — I10 Essential (primary) hypertension: Secondary | ICD-10-CM | POA: Diagnosis present

## 2017-11-11 DIAGNOSIS — Z86718 Personal history of other venous thrombosis and embolism: Secondary | ICD-10-CM

## 2017-11-11 DIAGNOSIS — Z833 Family history of diabetes mellitus: Secondary | ICD-10-CM

## 2017-11-11 DIAGNOSIS — F1410 Cocaine abuse, uncomplicated: Secondary | ICD-10-CM | POA: Diagnosis present

## 2017-11-11 DIAGNOSIS — Z79891 Long term (current) use of opiate analgesic: Secondary | ICD-10-CM

## 2017-11-11 DIAGNOSIS — Z950 Presence of cardiac pacemaker: Secondary | ICD-10-CM | POA: Diagnosis not present

## 2017-11-11 DIAGNOSIS — Z7901 Long term (current) use of anticoagulants: Secondary | ICD-10-CM

## 2017-11-11 DIAGNOSIS — Z9013 Acquired absence of bilateral breasts and nipples: Secondary | ICD-10-CM | POA: Diagnosis not present

## 2017-11-11 DIAGNOSIS — L89150 Pressure ulcer of sacral region, unstageable: Secondary | ICD-10-CM | POA: Diagnosis present

## 2017-11-11 DIAGNOSIS — Z923 Personal history of irradiation: Secondary | ICD-10-CM | POA: Diagnosis not present

## 2017-11-11 DIAGNOSIS — Z8249 Family history of ischemic heart disease and other diseases of the circulatory system: Secondary | ICD-10-CM | POA: Diagnosis not present

## 2017-11-11 DIAGNOSIS — Z515 Encounter for palliative care: Secondary | ICD-10-CM | POA: Diagnosis present

## 2017-11-11 LAB — URINALYSIS, ROUTINE W REFLEX MICROSCOPIC
Bilirubin Urine: NEGATIVE
GLUCOSE, UA: NEGATIVE mg/dL
Ketones, ur: NEGATIVE mg/dL
NITRITE: NEGATIVE
PH: 5 (ref 5.0–8.0)
Protein, ur: 30 mg/dL — AB
SPECIFIC GRAVITY, URINE: 1.015 (ref 1.005–1.030)

## 2017-11-11 LAB — COMPREHENSIVE METABOLIC PANEL
ALBUMIN: 2.6 g/dL — AB (ref 3.5–5.0)
ALT: 16 U/L (ref 0–44)
AST: 20 U/L (ref 15–41)
Alkaline Phosphatase: 103 U/L (ref 38–126)
Anion gap: 10 (ref 5–15)
BUN: 12 mg/dL (ref 6–20)
CHLORIDE: 100 mmol/L (ref 98–111)
CO2: 24 mmol/L (ref 22–32)
Calcium: 8.2 mg/dL — ABNORMAL LOW (ref 8.9–10.3)
Creatinine, Ser: <0.3 mg/dL — ABNORMAL LOW (ref 0.44–1.00)
GLUCOSE: 112 mg/dL — AB (ref 70–99)
POTASSIUM: 3.8 mmol/L (ref 3.5–5.1)
SODIUM: 134 mmol/L — AB (ref 135–145)
Total Bilirubin: 0.6 mg/dL (ref 0.3–1.2)
Total Protein: 7.4 g/dL (ref 6.5–8.1)

## 2017-11-11 LAB — CBC WITH DIFFERENTIAL/PLATELET
BASOS ABS: 0 10*3/uL (ref 0.0–0.1)
BASOS PCT: 0 %
EOS PCT: 0 %
Eosinophils Absolute: 0 10*3/uL (ref 0.0–0.7)
HCT: 43.3 % (ref 36.0–46.0)
Hemoglobin: 13.6 g/dL (ref 12.0–15.0)
Lymphocytes Relative: 2 %
Lymphs Abs: 0.3 10*3/uL — ABNORMAL LOW (ref 0.7–4.0)
MCH: 25.3 pg — ABNORMAL LOW (ref 26.0–34.0)
MCHC: 31.4 g/dL (ref 30.0–36.0)
MCV: 80.5 fL (ref 78.0–100.0)
MONO ABS: 0.8 10*3/uL (ref 0.1–1.0)
Monocytes Relative: 6 %
Neutro Abs: 12.6 10*3/uL — ABNORMAL HIGH (ref 1.7–7.7)
Neutrophils Relative %: 92 %
PLATELETS: 245 10*3/uL (ref 150–400)
RBC: 5.38 MIL/uL — AB (ref 3.87–5.11)
RDW: 17 % — AB (ref 11.5–15.5)
WBC: 13.7 10*3/uL — AB (ref 4.0–10.5)

## 2017-11-11 LAB — PROTIME-INR
INR: 1.09
PROTHROMBIN TIME: 14 s (ref 11.4–15.2)

## 2017-11-11 LAB — TROPONIN I

## 2017-11-11 LAB — RAPID URINE DRUG SCREEN, HOSP PERFORMED
Amphetamines: NOT DETECTED
Barbiturates: NOT DETECTED
Benzodiazepines: NOT DETECTED
COCAINE: POSITIVE — AB
OPIATES: POSITIVE — AB
TETRAHYDROCANNABINOL: NOT DETECTED

## 2017-11-11 LAB — CG4 I-STAT (LACTIC ACID): Lactic Acid, Venous: 1.38 mmol/L (ref 0.5–1.9)

## 2017-11-11 LAB — CK: Total CK: 138 U/L (ref 38–234)

## 2017-11-11 MED ORDER — HYDROMORPHONE HCL 1 MG/ML IJ SOLN
1.0000 mg | Freq: Once | INTRAMUSCULAR | Status: DC
  Filled 2017-11-11: qty 1

## 2017-11-11 MED ORDER — FLEET ENEMA 7-19 GM/118ML RE ENEM: 1.0000 | ENEMA | Freq: Once | RECTAL | Status: DC

## 2017-11-11 MED ORDER — FENTANYL 100 MCG/HR TD PT72: 400.0000 ug | MEDICATED_PATCH | TRANSDERMAL | Status: DC

## 2017-11-11 MED ORDER — OXYCODONE HCL 20 MG/ML PO CONC
80.0000 mg | ORAL | Status: DC
  Administered 2017-11-11 (×2): 80 mg via ORAL
  Filled 2017-11-11 (×20): qty 4

## 2017-11-11 MED ORDER — VANCOMYCIN HCL IN DEXTROSE 1-5 GM/200ML-% IV SOLN
1000.0000 mg | Freq: Once | INTRAVENOUS | Status: AC
  Administered 2017-11-11: 1000 mg via INTRAVENOUS
  Filled 2017-11-11: qty 200

## 2017-11-11 MED ORDER — SODIUM CHLORIDE 0.9 % IV SOLN
1.0000 g | Freq: Three times a day (TID) | INTRAVENOUS | Status: DC
  Filled 2017-11-11 (×4): qty 1

## 2017-11-11 MED ORDER — FENTANYL 12 MCG/HR TD PT72
100.0000 ug | MEDICATED_PATCH | TRANSDERMAL | Status: DC
  Administered 2017-11-11: 100 ug via TRANSDERMAL
  Filled 2017-11-11: qty 8

## 2017-11-11 MED ORDER — ONDANSETRON HCL 4 MG PO TABS: 4.0000 mg | ORAL_TABLET | Freq: Four times a day (QID) | ORAL | Status: DC | PRN

## 2017-11-11 MED ORDER — ACETAMINOPHEN 650 MG RE SUPP: 650.0000 mg | Freq: Four times a day (QID) | RECTAL | Status: DC | PRN

## 2017-11-11 MED ORDER — DAKINS (1/4 STRENGTH) 0.125 % EX SOLN
Freq: Every day | CUTANEOUS | Status: DC
  Filled 2017-11-11 (×2): qty 473

## 2017-11-11 MED ORDER — FENTANYL 100 MCG/HR TD PT72
100.0000 ug | MEDICATED_PATCH | TRANSDERMAL | Status: DC
  Administered 2017-11-11: 100 ug via TRANSDERMAL
  Filled 2017-11-11: qty 1

## 2017-11-11 MED ORDER — VANCOMYCIN HCL 500 MG IV SOLR
500.0000 mg | Freq: Two times a day (BID) | INTRAVENOUS | Status: DC
  Filled 2017-11-11 (×2): qty 500

## 2017-11-11 MED ORDER — METRONIDAZOLE IN NACL 5-0.79 MG/ML-% IV SOLN
500.0000 mg | Freq: Three times a day (TID) | INTRAVENOUS | Status: DC
  Administered 2017-11-11: 500 mg via INTRAVENOUS
  Filled 2017-11-11 (×2): qty 100

## 2017-11-11 MED ORDER — CLONAZEPAM 0.5 MG PO TABS: 1.0000 mg | ORAL_TABLET | Freq: Two times a day (BID) | ORAL | Status: DC | PRN

## 2017-11-11 MED ORDER — METRONIDAZOLE 500 MG PO TABS: 500.0000 mg | ORAL_TABLET | Freq: Three times a day (TID) | ORAL | 0 refills | Status: AC

## 2017-11-11 MED ORDER — HYDROMORPHONE HCL 1 MG/ML IJ SOLN
1.0000 mg | Freq: Once | INTRAMUSCULAR | Status: AC
  Administered 2017-11-11: 1 mg via INTRAVENOUS
  Filled 2017-11-11: qty 1

## 2017-11-11 MED ORDER — METRONIDAZOLE IN NACL 5-0.79 MG/ML-% IV SOLN: 500.0000 mg | Freq: Three times a day (TID) | INTRAVENOUS | Status: DC

## 2017-11-11 MED ORDER — SODIUM CHLORIDE 0.9 % IV SOLN: 2.0000 g | Freq: Once | INTRAVENOUS | Status: DC

## 2017-11-11 MED ORDER — RIVAROXABAN 20 MG PO TABS
20.0000 mg | ORAL_TABLET | Freq: Every day | ORAL | Status: DC
  Administered 2017-11-11: 20 mg via ORAL
  Filled 2017-11-11 (×3): qty 1

## 2017-11-11 MED ORDER — ACETAMINOPHEN 325 MG PO TABS: 650.0000 mg | ORAL_TABLET | Freq: Four times a day (QID) | ORAL | Status: DC | PRN

## 2017-11-11 MED ORDER — SODIUM CHLORIDE 0.9 % IV BOLUS (SEPSIS)
1000.0000 mL | Freq: Once | INTRAVENOUS | Status: AC
  Administered 2017-11-11: 1000 mL via INTRAVENOUS

## 2017-11-11 MED ORDER — SODIUM CHLORIDE 0.9 % IV BOLUS
1000.0000 mL | Freq: Once | INTRAVENOUS | Status: AC
  Administered 2017-11-11: 1000 mL via INTRAVENOUS

## 2017-11-11 MED ORDER — HYDROMORPHONE HCL 1 MG/ML IJ SOLN
1.0000 mg | INTRAMUSCULAR | Status: DC | PRN
  Filled 2017-11-11: qty 1

## 2017-11-11 MED ORDER — SODIUM CHLORIDE 0.9 % IV SOLN
2.0000 g | Freq: Once | INTRAVENOUS | Status: AC
  Administered 2017-11-11: 2 g via INTRAVENOUS
  Filled 2017-11-11: qty 2

## 2017-11-11 MED ORDER — ONDANSETRON HCL 4 MG/2ML IJ SOLN: 4.0000 mg | Freq: Four times a day (QID) | INTRAMUSCULAR | Status: DC | PRN

## 2017-11-11 MED ORDER — HYDROMORPHONE HCL 1 MG/ML IJ SOLN
1.0000 mg | Freq: Once | INTRAMUSCULAR | Status: AC
  Administered 2017-11-11: 1 mg via INTRAVENOUS

## 2017-11-11 MED ORDER — VANCOMYCIN HCL IN DEXTROSE 1-5 GM/200ML-% IV SOLN: 1000.0000 mg | Freq: Once | INTRAVENOUS | Status: DC

## 2017-11-11 MED ORDER — SODIUM CHLORIDE 0.9 % IV SOLN: INTRAVENOUS | Status: DC

## 2017-11-11 MED ORDER — DOXYCYCLINE HYCLATE 100 MG PO TABS: 100.0000 mg | ORAL_TABLET | Freq: Two times a day (BID) | ORAL | 0 refills | Status: AC

## 2017-11-11 MED ORDER — HEPARIN SOD (PORK) LOCK FLUSH 100 UNIT/ML IV SOLN
500.0000 [IU] | INTRAVENOUS | Status: AC | PRN
  Administered 2017-11-11: 500 [IU]
  Filled 2017-11-11: qty 5

## 2017-11-11 MED ORDER — DAKINS (1/2 STRENGTH) 0.25 % EX SOLN: 1.0000 "application " | Freq: Every day | CUTANEOUS | 0 refills | Status: AC

## 2017-11-11 MED ORDER — POLYETHYLENE GLYCOL 3350 17 G PO PACK: 17.0000 g | PACK | Freq: Every day | ORAL | 0 refills | Status: AC

## 2017-11-11 NOTE — Discharge Summary (Signed)
Physician Discharge Summary  Ashlee Moore BJS:[id number] DOB: 09/13/1960 DOA: 11/11/2017  PCP: Terald Sleeper, PA-C  Admit date: 11/11/2017 Discharge date: 11/11/2017  Time spent: 45 minutes  Recommendations for Outpatient Follow-up:  Patient will be discharged to home with home hospice to follow.  She may follow up with her primary care provider.   Discharge Diagnoses:  Sepsis secondary to sacral decubitus ulcer Hypotension Metastatic breast cancer with mets to the spine and history of cord compression/chronic paraplegia and chronic pain Constipation Illicit drug use History of DVT Goals of care  Discharge Condition: Stable  Diet recommendation: Regular  Filed Weights   11/11/17 0431 11/11/17 1134  Weight: 54.4 kg 56.9 kg    History of present illness:  Ashlee Moore is a 57 y.o. 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  Patient was noted by family to have discharge as well as foul drainage from her sacral pressure ulcer.  She was recently discharged from residential hospice due to illicit drug use.  Patient's family had come to visit her recently and noted that she was in poor condition and was then transferred to the emergency department.  Upon arrival to the ED, patient was noted to have 8 fentanyl patches on her and was covered with urine and feces.  Patient has been followed by Dr. Audree Camel from [place], [place]. currently patient has no complaints other than pain and would like to be discharged home.  She denies current chest pain, shortness of breath, abdominal pain, nausea or vomiting, diarrhea or constipation, dizziness or headache.  Hospital Course:  Sepsis secondary to sacral  decubitus ulcer -Patient presented with tachycardia, tachypnea, leukocytosis -Sacral ulcer appears to be necrotic -Wound care as well as general surgery consulted and appreciated -Blood cultures show no growth -Was placed on antibiotics per sepsis order set with aztreonam (penicillin allergy), Flagyl and vancomycin, IV fluids -CT abdomen pelvis showed fecal impaction with rectal distention, sacral decubitus ulcer with skin thickening extending close to the sacral margin, no subcutaneous abscess or frank bony destructive change.  Osseous metastatic left iliac bone disease.  Decreased size of left lower lobe pulmonary nodule. -Continue pain control -Of note chest x-ray and UA reviewed and found to be unremarkable for infection.  Foley bag noted to have sediment, urine culture pending. -Will discharge patient with doxy and flagyl -she does not want surgery  -Wound care recommended Silicone foam dressing to 1 cm nonintact lesions that resulted from exposure to incontinence. Cleanse sacral and buttocks wounds with NS.  Apply Dakin's moist kerlix to wound bed. Cover with dry gauze and ABD pads with tape.  Change daily.   Hypotension -Thought to be secondary to sepsis however per questioning patient as well as family members, baseline blood pressure around 80/60  Metastatic breast cancer with mets to the spine and history of cord compression/chronic paraplegia and chronic pain -Seems to have occurred in January 2019 -[facility] a [facility] however was discharged a few days ago due to illicit drugs found -CT findings as above -Will continue patient's home regimen of fentanyl patch 100 mcg every 72 hours as well as oxycodone. Was placed on IV dilaudid.  -Palliative care consulted and appreciated -Social work and  case management consulted and appreciated discharge planning -Discussed with patient's oncologist, Dr. Audree Camel ([place]). Patient has poor compliance  and last chemotherapy was about 4 months ago. He recommended hospice/palliative/comfort care.  Constipation -CT findings as above -Given Fleet enema continue to monitor -Given pain regimen, patient needs to be on a bowel regimen  Illicit drug use -Urine tox screen positive for cocaine as well as opiates -Counseled   History of DVT -Xarelto noted on medication list  Goals of care -Discussed with patient as well as family members at bedside, patient is DNR this was confirmed.  Will consult palliative care to discuss further hospice needs, home versus residential if possible.  -Discussed with patient as well as family members, it is clear and evident that she is unable to be taking care of at home.  She frequently states she would like to go home, before Thursday at 10 AM for a meeting. She continues to want to go home, I have asked her to stay for additional 24 hours to allow the medications to work. She does not wish to stay in the hospital. I have also discussed this with her niece- She understand that the decision is the patient's and that she is leaving against medical advice.  -As above, social work and case management also consulted -patient will be discharged with hospice to follow her at home  Procedures: none  Consultations: Palliative care  Discharge Exam: Vitals:   11/11/17 0830 11/11/17 1134  BP: (!) 87/64 110/64  Pulse:  95  Resp: (!) 24 20  Temp:  98.2 F (36.8 C)  SpO2:  100%   Patient wanting to go home against medical advice.    General: Well developed, well nourished, NAD, appears stated age  HEENT: NCAT, mucous membranes moist.  Neck: Supple  Cardiovascular: S1 S2 auscultated, RRR  Respiratory: Clear to auscultation bilaterally with equal chest rise  Abdomen: Soft, nontender, nondistended, + bowel sounds  Extremities: warm dry without cyanosis clubbing. LE edema B/L  Neuro: AAOx3, chronic paraplegia   Discharge Instructions Discharge  Instructions    Discharge instructions   Complete by:  As directed    Wound care dressing changes:  Silicone foam dressing to 1 cm nonintact lesions that resulted from exposure to incontinence.  Cleanse sacral and buttocks wounds with NS.  Apply Dakin's moist kerlix to wound bed. Cover with dry gauze and ABD pads with tape.  Change daily.     Allergies as of 11/11/2017      Reactions   Penicillins Swelling   .Has patient had a PCN reaction causing immediate rash, facial/tongue/throat swelling, SOB or lightheadedness with hypotension: Yes Has patient had a PCN reaction causing severe rash involving mucus membranes or skin necrosis: No Has patient had a PCN reaction that required hospitalization: Unknown Has patient had a PCN reaction occurring within the last 10 years: No If all of the above answers are "NO", then may proceed with Cephalosporin use.   Sulfasalazine Rash   Sulfonamide Derivatives Rash   Latex    adhesive   Tape Other (See Comments)   Adhesive tape- RASH, SWELLING All adhesives      Medication List    STOP taking these medications   cephALEXin 500 MG capsule Commonly known as:  KEFLEX   cyclobenzaprine 10 MG tablet Commonly known as:  FLEXERIL   furosemide 40 MG tablet Commonly known as:  LASIX   ondansetron 8 MG tablet Commonly known as:  ZOFRAN   potassium chloride SA  20 MEQ tablet Commonly known as:  K-DUR,KLOR-CON   triamterene-hydrochlorothiazide 37.5-25 MG capsule Commonly known as:  DYAZIDE   UNABLE TO FIND     TAKE these medications   clonazePAM 1 MG tablet Commonly known as:  KLONOPIN TAKE 1 TABLET BY MOUTH TWICE DAILY AS NEEDED FOR ANXIETY   doxycycline 100 MG tablet Commonly known as:  VIBRA-TABS Take 1 tablet (100 mg total) by mouth 2 (two) times daily.   fentaNYL 100 MCG/HR Commonly known as:  DURAGESIC - dosed mcg/hr Place 400 mcg onto the skin every 3 (three) days. Rotate   metroNIDAZOLE 500 MG tablet Commonly known as:   FLAGYL Take 1 tablet (500 mg total) by mouth 3 (three) times daily for 10 days.   Oxycodone HCl 20 MG Tabs Take 40 mg by mouth every 3 (three) hours.   polyethylene glycol packet Commonly known as:  MIRALAX / GLYCOLAX Take 17 g by mouth daily.   pregabalin 100 MG capsule Commonly known as:  LYRICA TAKE ONE CAPSULE BY MOUTH TWICE DAILY AS NEEDED FOR PAIN   rivaroxaban 20 MG Tabs tablet Commonly known as:  XARELTO Take 1 tablet (20 mg total) by mouth daily with supper.   sodium hypochlorite external solution Commonly known as:  DAKIN'S 1/2 STRENGTH Irrigate with 1 application as directed daily.   THERA Tabs Take 1 tablet by mouth at bedtime.   zolpidem 10 MG tablet Commonly known as:  AMBIEN TAKE 1 TABLET BY MOUTH AT BEDTIME AS NEEDED FOR SLEEP      Allergies  Allergen Reactions  . Penicillins Swelling    .Has patient had a PCN reaction causing immediate rash, facial/tongue/throat swelling, SOB or lightheadedness with hypotension: Yes Has patient had a PCN reaction causing severe rash involving mucus membranes or skin necrosis: No Has patient had a PCN reaction that required hospitalization: Unknown Has patient had a PCN reaction occurring within the last 10 years: No If all of the above answers are "NO", then may proceed with Cephalosporin use.   . Sulfasalazine Rash  . Sulfonamide Derivatives Rash  . Latex     adhesive  . Tape Other (See Comments)    Adhesive tape- RASH, SWELLING   All adhesives   Follow-up Information    Terald Sleeper, PA-C. Schedule an appointment as soon as possible for a visit in 1 week(s).   Specialties:  Librarian, academic, Family Medicine Why:  As needed Contact information: [place] [address] [phone]            The results of significant diagnostics from this hospitalization (including imaging, microbiology, ancillary and laboratory) are listed below for reference.    Significant Diagnostic Studies: Ct  Abdomen Pelvis Wo Contrast  Result Date: 11/11/2017 CLINICAL DATA:  Sacral decubitus ulcer. History of breast cancer metastatic to bone. EXAM: CT ABDOMEN AND PELVIS WITHOUT CONTRAST TECHNIQUE: Multidetector CT imaging of the abdomen and pelvis was performed following the standard protocol without IV contrast. COMPARISON:  PET CT 11/14/2016, enhanced abdominal CT 09/19/2016 FINDINGS: Lower chest: Pacemaker partially included. Decreased size of left lower lobe pulmonary nodule from prior exam, now with only minimal residual scarring. An additional 5 mm nodule in the left lower lobe image 11 series 4 was not definitively seen previously. Hepatobiliary: Lack of IV contrast limits assessment for focal hepatic abnormality, additionally patient scanned with arms down positioning and there is motion. Intra and extrahepatic biliary ductal dilatation is grossly similar to prior. Postcholecystectomy. Pancreas: No gross pancreatic inflammation  or ductal dilatation, patient motion artifact through the pancreas significantly limits assessment. Spleen: Grossly normal, motion artifact limitations. Adrenals/Urinary Tract: No dominant adrenal nodule. Motion artifact through the kidneys without evidence of hydronephrosis. No significant perinephric edema. Urinary bladder decompressed by Foley catheter. Stomach/Bowel: Bowel evaluation is limited in the absence of contrast, paucity of intra-abdominal fat, and patient motion. Prior gastric bypass. No small bowel dilatation. Colonic tortuosity with moderate volume of stool. Significant rectal distention with stool spanning 10.3 cm with distal rectal wall thickening. Mild perirectal edema. No convincing pneumatosis. Vascular/Lymphatic: Lack of contrast, patient motion, and paucity of intra-abdominal fat limits assessment for adenopathy, no bulky adenopathy is seen. Normal caliber abdominal aorta. Linear density extending from the distal IVC into the retroperitoneal fat is unchanged from  previous. Reproductive: Status post hysterectomy. No adnexal masses. Other: No significant ascites.  No evidence of free air. Musculoskeletal: Sacral decubitus ulcer with skin thickening extending close to bone. Small foci of air in the soft tissues tracks distally along the gluteal crease. No evidence of subcutaneous abscess. No definite sacral destructive change. Persistent wall thickening appearance of the left iliac bone consistent with metastatic disease without significant change from prior imaging. Vertebroplasty within L5. Vertebroplasty within T10 with bony sclerosis of the vertebral body. Sacral plasty also seen. Intramuscular fat density lesion within right posterior external oblique muscle, unchanged. IMPRESSION: 1 1. Fecal impaction with rectal distention, rectal wall thickening and perirectal edema. 2. Sacral decubitus ulcer with skin thickening extending close to sacral margin. No subcutaneous abscess or frank bony destructive change. 3. Again seen osseous metastatic involving left iliac bone disease, similar to prior PET. 4. Decreased size of left lower lobe pulmonary nodule. An additional small 5 mm no nodular density dule in the left lower lobe is not seen on prior exam, unclear if this is a pulmonary nodule or atelectasis. Attention to this at follow-up recommended. Electronically Signed   By: Keith Rake M.D.   On: 11/11/2017 07:00   Ct Head Wo Contrast  Result Date: 11/11/2017 CLINICAL DATA:  57 y/o F; history of breast cancer with Mets to bones. Encephalopathy. EXAM: CT HEAD WITHOUT CONTRAST TECHNIQUE: Contiguous axial images were obtained from the base of the skull through the vertex without intravenous contrast. COMPARISON:  03/28/2014 MRI of the head. FINDINGS: Brain: No evidence of acute infarction, hemorrhage, hydrocephalus, extra-axial collection or mass lesion/mass effect. Stable chronic microvascular ischemic changes and volume loss of the brain. Vascular: No hyperdense vessel  or unexpected calcification. Skull: Normal. Negative for fracture or focal lesion. Sinuses/Orbits: No acute finding. Other: None. IMPRESSION: 1. No acute intracranial abnormality identified. 2. Stable chronic microvascular ischemic changes and volume loss of the brain. Electronically Signed   By: Kristine Garbe M.D.   On: 11/11/2017 06:58   Dg Chest Port 1 View  Result Date: 11/11/2017 CLINICAL DATA:  57 y/o F; multiple pressure ulcers, being treated for sepsis. EXAM: PORTABLE CHEST 1 VIEW COMPARISON:  09/17/2016 chest radiograph FINDINGS: Stable normal cardiac silhouette. 2 lead pacemaker with stable positioning of leads. Right port catheter tip projects over mid SVC. Aortic atherosclerosis with calcification. Clear lungs. No pleural effusion or pneumothorax. No acute osseous abnormality is evident. Surgical clips project over right axilla. IMPRESSION: No active disease. Electronically Signed   By: Kristine Garbe M.D.   On: 11/11/2017 06:04    Microbiology: Recent Results (from the past 240 hour(s))  Blood culture (routine x 2)     Status: None (Preliminary result)   Collection Time: 11/11/17  5:30 AM  Result Value Ref Range Status   Specimen Description BLOOD RIGHT ARM  Final   Special Requests   Final    BOTTLES DRAWN AEROBIC AND ANAEROBIC Blood Culture adequate volume   Culture   Final    NO GROWTH <12 HOURS Performed at [facility], [address], [place], [place] [address]    Report Status PENDING  Incomplete  Blood culture (routine x 2)     Status: None (Preliminary result)   Collection Time: 11/11/17  5:30 AM  Result Value Ref Range Status   Specimen Description BLOOD LEFT ARM  Final   Special Requests   Final    BOTTLES DRAWN AEROBIC AND ANAEROBIC Blood Culture adequate volume   Culture   Final    NO GROWTH <12 HOURS Performed at [facility], [address], [place], [place] [address]    Report Status PENDING  Incomplete     Labs: Basic Metabolic  Panel: Recent Labs  Lab 11/11/17 0414  NA 134*  K 3.8  CL 100  CO2 24  GLUCOSE 112*  BUN 12  CREATININE <0.30*  CALCIUM 8.2*   Liver Function Tests: Recent Labs  Lab 11/11/17 0414  AST 20  ALT 16  ALKPHOS 103  BILITOT 0.6  PROT 7.4  ALBUMIN 2.6*   No results for input(s): LIPASE, AMYLASE in the last 168 hours. No results for input(s): AMMONIA in the last 168 hours. CBC: Recent Labs  Lab 11/11/17 0414  WBC 13.7*  NEUTROABS 12.6*  HGB 13.6  HCT 43.3  MCV 80.5  PLT 245   Cardiac Enzymes: Recent Labs  Lab 11/11/17 0532  CKTOTAL 138  TROPONINI <0.03   BNP: BNP (last 3 results) No results for input(s): BNP in the last 8760 hours.  ProBNP (last 3 results) No results for input(s): PROBNP in the last 8760 hours.  CBG: No results for input(s): GLUCAP in the last 168 hours.     Signed:  Cristal Ford  [facility] 11/11/2017, 1:40 PM

## 2017-11-11 NOTE — ED Notes (Signed)
Ashlee Moore (Case Management) in the rm with Pt at this time.

## 2017-11-11 NOTE — Progress Notes (Signed)
Pharmacy Antibiotic Note  Ashlee Moore is a 57 y.o. female admitted on 11/11/2017 with sepsis  Pharmacy has been consulted for Aztreonam and Vancomycin dosing.  Plan: Vancomycin 1000mg  IV given in ED, then 500mg  IV every 12 hours.  Goal trough 15-20 mcg/mL.  Aztreonam 1gm IV q8h Also, on flagyl 500mg  IV q8h F/U cxs and clinical progress Monitor V/S, labs and levels as indicated  Height: 5\' 5"  (165.1 cm) Weight: 120 lb (54.4 kg) IBW/kg (Calculated) : 57  Temp (24hrs), Avg:97.6 F (36.4 C), Min:97.6 F (36.4 C), Max:97.6 F (36.4 C)  Recent Labs  Lab 11/11/17 0414 11/11/17 0537  WBC 13.7*  --   CREATININE <0.30*  --   LATICACIDVEN  --  1.38     CrCL ~ 20mls/min  Allergies  Allergen Reactions  . Penicillins Swelling  . Sulfasalazine Rash  . Sulfonamide Derivatives Rash  . Latex     adhesive  . Tape Other (See Comments)    Adhesive tape- RASH, SWELLING   All adhesives    Antimicrobials this admission: Vancomycin 10/2 >>  Aztreonam 10/2 >>  Flagyl 10/2>>  Dose adjustments this admission: N/A  Microbiology results: 10/2 BCx: pending 10/2 UCx: pending  Thank you for allowing pharmacy to be a part of this patient's care.  Isac Sarna, BS Pharm D, [place] Clinical Pharmacist Pager [phone] 11/11/2017 9:18 AM

## 2017-11-11 NOTE — Consult Note (Signed)
[place] Nurse wound consult note Reason for Consult:Unstageable pressure injury to coccyx/sacrum and left buttocks.  100% necrotic tissue to wound bed.  Full thickness tissue loss to buttocks and posterior upper thighs from prolonged exposure to incontinence and pressure.  Patient was discharged from Hospice. Has metastatic breast CA and paraplegia.  Wound healing is not the goal at this point. Wound type: Full thickness pressure injuries Pressure Injury POA: Yes Measurement:bedside RN to assist with measurement when admitted to unit.  Wound NFA:[id number] wound and left  Buttocks are 100% devitalized tissue.  Drainage (amount, consistency, odor) moderate tan drainage with necrotic odor.  Periwound: blanchable erythema from exposure to incontinence.  Foley catheter has been inserted and skin is clean and dry at this time.  Dressing procedure/placement/frequency: Silicone foam dressing to 1 cm nonintact lesions that resulted from exposure to incontinence.  Cleanse sacral and buttocks wounds with NS.  Apply Dakin's moist kerlix to wound bed. Cover with dry gauze and ABD pads with tape.  Change daily.  Will not follow at this time.  Please re-consult if needed.  Domenic Moras MSN, RN, FNP-BC CWON Wound, Ostomy, Continence Nurse Pager [phone]

## 2017-11-11 NOTE — Care Management (Addendum)
CM referral for DC planning and hospice referral. Pt coming from home, she was discharged from [facility] and sent home with no services. Pt's niece at the bedside and per pt, okay to discuss plan with niece at bedside. Pt's brother lives next door and has helped care for patient. Pt has large sacral wound and no instruction was given on how to care for wound.   Pt interested in finding another hospice provider. Has plan for staying with family but is also open to SNF placement if possible under her medicaid. Has no preference of hospice provider, okay with speaking to [facility] rep. CM called referral and rep, Juanda Crumble, will see pt this afternoon to discuss options.   Palliative medicine to see pt for pain control.   Updated 11/11/17 1300: Pt met with [facility] rep. Pt agreeable to hospice services in the home. Pt requests DC home today with EMS transport. MD aware, CM will provide update to [facility] rep of pt's DC.

## 2017-11-11 NOTE — H&P (Signed)
[facility] History and Physical  Ashlee Moore MCN:[id number] DOB: 23-Feb-1960 DOA: 11/11/2017  PCP: Terald Sleeper, PA-C  Patient coming from: home  Chief Complaint: Sacral ulcer  HPI: Ashlee Moore is a 57 y.o. 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  Patient was noted by family to have discharge as well as foul drainage from her sacral pressure ulcer.  She was recently discharged from residential hospice due to illicit drug use.  Patient's family had come to visit her recently and noted that she was in poor condition and was then transferred to the emergency department.  Upon arrival to the ED, patient was noted to have 8 fentanyl patches on her and was covered with urine and feces.  Patient has been followed by Dr. Audree Camel from [place], [place]. currently patient has no complaints other than pain and would like to be discharged home.  She denies current chest pain, shortness of breath, abdominal pain, nausea or vomiting, diarrhea or constipation, dizziness or headache.  ED Course: Patient found to have sepsis secondary to possible UTI versus infected sacral decubitus ulcer.  Given vancomycin, Flagyl, aztreonam and 2 L of IV fluid.  TRH called for admission.  Review of Systems:  All other systems reviewed and are negative.   Past Medical History:  Diagnosis Date  . Anemia   . Arthritis   . AV block, Mobitz 2 11/14/09   s/p MDT Revo PPM by JA  . Bipolar disorder ([place])   . Breast cancer metastasized to bone ([facility]) 08/16/2012  . Cancer ([place])    right breast/LAST CHEOM 10/07/11  . Chills   . Chronic pain 12/13/2015  . Easy bruising   . Elevated LFTs    hx  . Fever   . Fibromyalgia   .  Generalized headaches   . GERD (gastroesophageal reflux disease)   . History of radiation therapy 03/07/2017 to 03/30/2017   The L-Spine was treated to 6 Gy in 2 fractions of 3 Gy (emergency treatment over the weekend), The L-Spine was boosted to 24 Gy in 8 fractions of 3 Gy (30 Gy total)  . Hypertension   . Morbid obesity with body mass index of 40.0-44.9 in adult ([place]) 10/22/2011   BMI 44.26    . Osteoporosis   . Pacemaker   . Sleep apnea    STOP BANG SCORE 4  . Weakness     Past Surgical History:  Procedure Laterality Date  . BILATERAL KNEE ARTHROSCOPY    . BREAST SURGERY  2012,12   lumpectomy,mastectomy bil  . CHOLECYSTECTOMY    . ECTOPIC PREGNANCY SURGERY     x2  . gastric bypass surgery    . left ovary and fallopian tube removed due to chronic pain    . mastectomy  2013   bilateral  . MULTIPLE EXTRACTIONS WITH ALVEOLOPLASTY N/A 06/04/2012   Procedure: MULTIPLE EXTRACION WITH ALVEOLOPLASTY EXTRACT: 4, 5, 8, 9, 10, 13, 14 ,28;  Surgeon: Gae Bon, DDS;  Location: [facility];  Service: Oral Surgery;  Laterality: N/A;  . PACEMAKER INSERTION  2011  . plastic surgery on face     for ptosis of rt. eyelid  . PORTACATH PLACEMENT     power port - right  .  PPM  11/14/09   MDT Revo implanted by Dr Rayann Heman for syncope/ Mobitz II AV block    Social History:  reports that she quit smoking about 32 years ago. Her smoking use included cigarettes. She has a 2.40 pack-year smoking history. She has never used smokeless tobacco. She reports that she does not drink alcohol or use drugs.   Allergies  Allergen Reactions  . Penicillins Swelling    .Has patient had a PCN reaction causing immediate rash, facial/tongue/throat swelling, SOB or lightheadedness with hypotension: Yes Has patient had a PCN reaction causing severe rash involving mucus membranes or skin necrosis: No Has patient had a PCN reaction that required hospitalization: Unknown Has patient had a PCN reaction occurring within the  last 10 years: No If all of the above answers are "NO", then may proceed with Cephalosporin use.   . Sulfasalazine Rash  . Sulfonamide Derivatives Rash  . Latex     adhesive  . Tape Other (See Comments)    Adhesive tape- RASH, SWELLING   All adhesives    Family History  Problem Relation Age of Onset  . Heart attack Father   . Hypertension Father   . Heart disease Father   . Breast cancer Sister   . Cancer Sister        brain  . COPD Mother   . Cancer Brother        lung  . Cancer Other        colon  . Brain cancer Sister   . Diabetes Paternal Grandmother      Prior to Admission medications   Medication Sig Start Date End Date Taking? Authorizing Provider  clonazePAM (KLONOPIN) 1 MG tablet TAKE 1 TABLET BY MOUTH TWICE DAILY AS NEEDED FOR ANXIETY 10/15/17  Yes Terald Sleeper, PA-C  cyclobenzaprine (FLEXERIL) 10 MG tablet TAKE 1 TABLET BY MOUTH AT BEDTIME FOR MUSCLE SPASM 11/05/17  Yes Terald Sleeper, PA-C  fentaNYL (DURAGESIC - DOSED MCG/HR) 100 MCG/HR Place 400 mcg onto the skin every 3 (three) days. Rotate   Yes [provider]  Multiple Vitamin (THERA) TABS Take 1 tablet by mouth at bedtime.  05/27/13  Yes [provider]  potassium chloride SA (K-DUR,KLOR-CON) 20 MEQ tablet Take 20 mEq by mouth daily.  08/10/12  Yes [provider]  pregabalin (LYRICA) 100 MG capsule TAKE ONE CAPSULE BY MOUTH TWICE DAILY AS NEEDED FOR PAIN 06/15/17  Yes Terald Sleeper, PA-C  rivaroxaban (XARELTO) 20 MG TABS tablet Take 1 tablet (20 mg total) by mouth daily with supper. 10/06/17  Yes Terald Sleeper, PA-C  triamterene-hydrochlorothiazide (DYAZIDE) 37.5-25 MG capsule TAKE ONE CAPSULE BY MOUTH EVERY MORNING 11/05/17  Yes Terald Sleeper, PA-C  zolpidem (AMBIEN) 10 MG tablet TAKE 1 TABLET BY MOUTH AT BEDTIME AS NEEDED FOR SLEEP 09/09/17  Yes Terald Sleeper, PA-C  cephALEXin (KEFLEX) 500 MG capsule Take 1 capsule (500 mg total) by mouth 4 (four) times daily. Patient not taking:  Reported on 11/11/2017 07/24/17   Terald Sleeper, PA-C  furosemide (LASIX) 40 MG tablet Take 1 tablet (40 mg total) by mouth daily. Patient not taking: Reported on 11/11/2017 04/07/17   Terald Sleeper, PA-C  ondansetron (ZOFRAN) 8 MG tablet TAKE 1 TABLET BY MOUTH EVERY 12 HOURS AS NEEDED Patient not taking: Reported on 11/11/2017 02/17/17   Terald Sleeper, PA-C  Oxycodone HCl 20 MG TABS Take 40 mg by mouth every 3 (three) hours.     [provider]  UNABLE TO FIND Apply 2 Pump topically 4 (four) times daily as needed. Compounded Pain Cream - Ibuprofen 5% Gabapentin 2% Baclofen 2% Prilocaine 2% Lidocaine 5% Cream    [provider]    Physical Exam: Vitals:   11/11/17 0800 11/11/17 0830  BP: 93/62 (!) 87/64  Pulse:    Resp: (!) 22 (!) 24  Temp:    SpO2:       General: Well developed, chronically ill-appearing, mild distress due to pain  HEENT: NCAT, PERRLA, EOMI, Anicteic Sclera, mucous membranes dry.   Neck: Supple, no JVD, no masses  Cardiovascular: S1 S2 auscultated, no murmurs, tachycardic  Respiratory: Clear to auscultation bilaterally with equal chest rise  Abdomen: Soft, nontender, nondistended, + bowel sounds  Extremities: warm dry without cyanosis clubbing. Lower extremity edema bilaterally  Neuro: AAOx3, cranial nerves intact as tested, right eye proptosis. Strength equal in bilateral upper extremities.,  Not tested lower extremities due to history of paraplegia.  Skin: Large sacral wound with foul-smelling discharge, black eschar overlying the left aspect of the wound.  Bilateral heel and lower extremity ulcerations, dressing in place.  Psych: anxious  Labs on Admission: I have personally reviewed following labs and imaging studies CBC: Recent Labs  Lab 11/11/17 0414  WBC 13.7*  NEUTROABS 12.6*  HGB 13.6  HCT 43.3  MCV 80.5  PLT 811   Basic Metabolic Panel: Recent Labs  Lab 11/11/17 0414  NA 134*  K 3.8  CL 100  CO2 24  GLUCOSE 112*    BUN 12  CREATININE <0.30*  CALCIUM 8.2*   GFR: CrCl cannot be calculated (This lab value cannot be used to calculate CrCl because it is not a number: <0.30). Liver Function Tests: Recent Labs  Lab 11/11/17 0414  AST 20  ALT 16  ALKPHOS 103  BILITOT 0.6  PROT 7.4  ALBUMIN 2.6*   No results for input(s): LIPASE, AMYLASE in the last 168 hours. No results for input(s): AMMONIA in the last 168 hours. Coagulation Profile: Recent Labs  Lab 11/11/17 0414  INR 1.09   Cardiac Enzymes: Recent Labs  Lab 11/11/17 0532  CKTOTAL 138  TROPONINI <0.03   BNP (last 3 results) No results for input(s): PROBNP in the last 8760 hours. HbA1C: No results for input(s): HGBA1C in the last 72 hours. CBG: No results for input(s): GLUCAP in the last 168 hours. Lipid Profile: No results for input(s): CHOL, HDL, LDLCALC, TRIG, CHOLHDL, LDLDIRECT in the last 72 hours. Thyroid Function Tests: No results for input(s): TSH, T4TOTAL, FREET4, T3FREE, THYROIDAB in the last 72 hours. Anemia Panel: No results for input(s): VITAMINB12, FOLATE, FERRITIN, TIBC, IRON, RETICCTPCT in the last 72 hours. Urine analysis:    Component Value Date/Time   COLORURINE YELLOW 11/11/2017 [place] 11/11/2017 0414   LABSPEC 1.015 11/11/2017 0414   PHURINE 5.0 11/11/2017 0414   GLUCOSEU NEGATIVE 11/11/2017 0414   HGBUR LARGE (A) 11/11/2017 0414   BILIRUBINUR NEGATIVE 11/11/2017 0414   KETONESUR NEGATIVE 11/11/2017 0414   PROTEINUR 30 (A) 11/11/2017 0414   NITRITE NEGATIVE 11/11/2017 0414   LEUKOCYTESUR SMALL (A) 11/11/2017 0414   Sepsis Labs: @LABRCNTIP (procalcitonin:4,lacticidven:4) ) Recent Results (from the past 240 hour(s))  Blood culture (routine x 2)     Status: None (Preliminary result)   Collection Time: 11/11/17  5:30 AM  Result Value Ref Range Status   Specimen Description BLOOD RIGHT ARM  Final   Special Requests   Final    BOTTLES  DRAWN AEROBIC AND ANAEROBIC Blood Culture  adequate volume   Culture   Final    NO GROWTH <12 HOURS Performed at [facility], [address], [place], [place] [address]    Report Status PENDING  Incomplete  Blood culture (routine x 2)     Status: None (Preliminary result)   Collection Time: 11/11/17  5:30 AM  Result Value Ref Range Status   Specimen Description BLOOD LEFT ARM  Final   Special Requests   Final    BOTTLES DRAWN AEROBIC AND ANAEROBIC Blood Culture adequate volume   Culture   Final    NO GROWTH <12 HOURS Performed at [facility], [address] [place], [place] [address]    Report Status PENDING  Incomplete     Radiological Exams on Admission: Ct Abdomen Pelvis Wo Contrast  Result Date: 11/11/2017 CLINICAL DATA:  Sacral decubitus ulcer. History of breast cancer metastatic to bone. EXAM: CT ABDOMEN AND PELVIS WITHOUT CONTRAST TECHNIQUE: Multidetector CT imaging of the abdomen and pelvis was performed following the standard protocol without IV contrast. COMPARISON:  PET CT 11/14/2016, enhanced abdominal CT 09/19/2016 FINDINGS: Lower chest: Pacemaker partially included. Decreased size of left lower lobe pulmonary nodule from prior exam, now with only minimal residual scarring. An additional 5 mm nodule in the left lower lobe image 11 series 4 was not definitively seen previously. Hepatobiliary: Lack of IV contrast limits assessment for focal hepatic abnormality, additionally patient scanned with arms down positioning and there is motion. Intra and extrahepatic biliary ductal dilatation is grossly similar to prior. Postcholecystectomy. Pancreas: No gross pancreatic inflammation or ductal dilatation, patient motion artifact through the pancreas significantly limits assessment. Spleen: Grossly normal, motion artifact limitations. Adrenals/Urinary Tract: No dominant adrenal nodule. Motion artifact through the kidneys without evidence of hydronephrosis. No significant perinephric edema. Urinary bladder decompressed by Foley  catheter. Stomach/Bowel: Bowel evaluation is limited in the absence of contrast, paucity of intra-abdominal fat, and patient motion. Prior gastric bypass. No small bowel dilatation. Colonic tortuosity with moderate volume of stool. Significant rectal distention with stool spanning 10.3 cm with distal rectal wall thickening. Mild perirectal edema. No convincing pneumatosis. Vascular/Lymphatic: Lack of contrast, patient motion, and paucity of intra-abdominal fat limits assessment for adenopathy, no bulky adenopathy is seen. Normal caliber abdominal aorta. Linear density extending from the distal IVC into the retroperitoneal fat is unchanged from previous. Reproductive: Status post hysterectomy. No adnexal masses. Other: No significant ascites.  No evidence of free air. Musculoskeletal: Sacral decubitus ulcer with skin thickening extending close to bone. Small foci of air in the soft tissues tracks distally along the gluteal crease. No evidence of subcutaneous abscess. No definite sacral destructive change. Persistent wall thickening appearance of the left iliac bone consistent with metastatic disease without significant change from prior imaging. Vertebroplasty within L5. Vertebroplasty within T10 with bony sclerosis of the vertebral body. Sacral plasty also seen. Intramuscular fat density lesion within right posterior external oblique muscle, unchanged. IMPRESSION: 1 1. Fecal impaction with rectal distention, rectal wall thickening and perirectal edema. 2. Sacral decubitus ulcer with skin thickening extending close to sacral margin. No subcutaneous abscess or frank bony destructive change. 3. Again seen osseous metastatic involving left iliac bone disease, similar to prior PET. 4. Decreased size of left lower lobe pulmonary nodule. An additional small 5 mm no nodular density dule in the left lower lobe is not seen on prior exam, unclear if this is a pulmonary nodule or atelectasis. Attention to this at follow-up  recommended. Electronically  Signed   By: Keith Rake M.D.   On: 11/11/2017 07:00   Ct Head Wo Contrast  Result Date: 11/11/2017 CLINICAL DATA:  57 y/o F; history of breast cancer with Mets to bones. Encephalopathy. EXAM: CT HEAD WITHOUT CONTRAST TECHNIQUE: Contiguous axial images were obtained from the base of the skull through the vertex without intravenous contrast. COMPARISON:  03/28/2014 MRI of the head. FINDINGS: Brain: No evidence of acute infarction, hemorrhage, hydrocephalus, extra-axial collection or mass lesion/mass effect. Stable chronic microvascular ischemic changes and volume loss of the brain. Vascular: No hyperdense vessel or unexpected calcification. Skull: Normal. Negative for fracture or focal lesion. Sinuses/Orbits: No acute finding. Other: None. IMPRESSION: 1. No acute intracranial abnormality identified. 2. Stable chronic microvascular ischemic changes and volume loss of the brain. Electronically Signed   By: Kristine Garbe M.D.   On: 11/11/2017 06:58   Dg Chest Port 1 View  Result Date: 11/11/2017 CLINICAL DATA:  57 y/o F; multiple pressure ulcers, being treated for sepsis. EXAM: PORTABLE CHEST 1 VIEW COMPARISON:  09/17/2016 chest radiograph FINDINGS: Stable normal cardiac silhouette. 2 lead pacemaker with stable positioning of leads. Right port catheter tip projects over mid SVC. Aortic atherosclerosis with calcification. Clear lungs. No pleural effusion or pneumothorax. No acute osseous abnormality is evident. Surgical clips project over right axilla. IMPRESSION: No active disease. Electronically Signed   By: Kristine Garbe M.D.   On: 11/11/2017 06:04    EKG: Independently reviewed.  Sinus rhythm, rate 96  Assessment/Plan  Sepsis secondary to sacral decubitus ulcer -Patient presented with tachycardia, tachypnea, leukocytosis -Sacral ulcer appears to be necrotic -Wound care as well as general surgery consulted and appreciated -Blood cultures  pending -Will continue antibiotics per sepsis order set with aztreonam (penicillin allergy), Flagyl and vancomycin, IV fluids -CT abdomen pelvis showed fecal impaction with rectal distention, sacral decubitus ulcer with skin thickening extending close to the sacral margin, no subcutaneous abscess or frank bony destructive change.  Osseous metastatic left iliac bone disease.  Decreased size of left lower lobe pulmonary nodule. -Continue pain control -Of note chest x-ray and UA reviewed and found to be unremarkable for infection.  Foley bag noted to have sediment, urine culture pending.  Hypotension -Thought to be secondary to sepsis however per questioning patient as well as family members, baseline blood pressure around 80/60 -Will continue to monitor closely and give IV fluids  Metastatic breast cancer with mets to the spine and history of cord compression/chronic paraplegia and chronic pain -Seems to have occurred in January 2019 -[facility] however was discharged a few days ago due to illicit drugs found -CT findings as above -Will continue patient's home regimen of fentanyl patch 100 mcg every 72 hours as well as oxycodone, IV Dilaudid as needed -Palliative care consulted and appreciated -Social work and case management consulted and appreciated discharge planning -Discussed with patient's oncologist, Dr. Audree Camel [place], [place]). Patient has poor compliance and last chemotherapy was about 4 months ago.  Constipation -CT findings as above -Will give Fleet enema continue to monitor -Given pain regimen, patient needs to be on a bowel regimen  Illicit drug use -Urine tox screen positive for cocaine as well as opiates -Counseled   History of DVT -Xarelto noted on medication list, will continue for now  Goals of care -Discussed with patient as well as family members at bedside, patient is DNR this was confirmed.  Will consult palliative care  to discuss further hospice needs,  home versus residential if possible.  -Discussed with patient as well as family members, it is clear and evident that she is unable to be taking care of at home.  She frequently states she would like to go home, before Thursday at 10 AM for a meeting. -As above, social work and case management also consulted  DVT prophylaxis: Xarelto  Code Status: DNR  Family Communication: Family at bedside.  Admission, patients condition and plan of care including tests being ordered have been discussed with the patient and family who indicate understanding and agree with the plan and Code Status.  Disposition Plan: To be determined  Consults called: Palliative care, general surgery  Admission status: Patient.  Patient will need IV antibiotics as well as IV fluids given her blood pressure and sepsis.  She also needs better pain control given her metastatic cancer.  Suspect patient will need hospitalization for at least 48 hours if not more until better pain regimen is achieved and infection is controlled.  Time spent: 70 minutes  Vikash Nest D.O. [facility]  Between 7am to 7pm - Please see pager noted on [email or web address]  After 7pm go to [email or web address] 11/11/2017, 10:57 AM

## 2017-11-11 NOTE — ED Triage Notes (Signed)
Pt arrives from home via [facility] who report Pt was staying with brother, sister-in-law, and grandson. Pt states her Niece flew in from [place] to see her and saw the condition the Pt was in and the niece is the one who called 911 to have Pt brought to ED. Pt presents with 7 Fentynll Patches placed on body, feces, indwelling catheter that is clotted over and multiple pressure ulcers on sacrum, legs heels and feet ranging from unstageable to level 3.  Pt states she signed her self out of hospice on this past Friday because she did not like it and the pressure ulcer and other problems just started since this past Friday.  Pt is a paraplegic and has a port in left chest. Pt also states she takes (2) 20mg  oxycodone every 2 hrs for the pain.

## 2017-11-11 NOTE — ED Provider Notes (Signed)
[facility] EMERGENCY DEPARTMENT Provider Note   CSN: [id number] Arrival date & time: 11/11/17  0351     History   Chief Complaint Chief Complaint  Patient presents with  . Pressure Ulcer    HPI Ashlee Moore is a 57 y.o. female.  Patient with paraplegia secondary to metastatic breast cancer.  She comes in tonight with pain from her sacral pressure ulcer as well as discharge and foul drainage. Apparently her niece saw her condition and was concerned and called EMS. She denies any fever.  She is a poor historian.  She states she is not receiving any chemotherapy or radiation at this time.  She was in hospice up until last week when she took herself out of hospice. Patient is a poor historian and level 5 caveat applies.  Patient reportedly took herself out of hospice last week and went home.  Patient's family member came to visit her and saw she was in poor condition and had her transported to the ED.  Patient found with multiple fentanyl patches covered in urine and feces with dirty appearing ulcers and dirty Foley catheter.  Patient states her oncologist is in [place] ([place]is Darovsku) and did not think that any more chemotherapy or radiation will be helpful.  Patient has not had any admissions in our system since she was first diagnosed with cord compression in January 2019.  She states she has not been able to ambulate since March.  Patient's brother and niece have arrived.  She does not have an advanced directive but has stated that she wants to be DNR/DNI in the past.  She does not have a power of attorney.  Patient's brother states she was "kicked out of hospice" 5 days ago because crack was found in her room and assumed to be hers.  Patient's brother states patient had chemo for 2 months due to insurance problems.  Her oncologist suggested that she go to hospice last week which she did before getting "kicked out". Brother states patient is supposed to be wearing 400 mcg of  fentanyl patches every 72 hours.  She arrives with at least 8 fentanyl patches on in place.  Oncologist is Dr. Audree Camel from [place]. PCP is Particia Nearing PA-C.  The history is provided by the patient and the EMS personnel. The history is limited by the condition of the patient.    Past Medical History:  Diagnosis Date  . Anemia   . Arthritis   . AV block, Mobitz 2 11/14/09   s/p MDT Revo PPM by JA  . Bipolar disorder ([place])   . Breast cancer metastasized to bone ([place]) 08/16/2012  . Cancer ([place])    right breast/LAST CHEOM 10/07/11  . Chills   . Chronic pain 12/13/2015  . Easy bruising   . Elevated LFTs    hx  . Fever   . Fibromyalgia   . Generalized headaches   . GERD (gastroesophageal reflux disease)   . History of radiation therapy 03/07/2017 to 03/30/2017   The L-Spine was treated to 6 Gy in 2 fractions of 3 Gy (emergency treatment over the weekend), The L-Spine was boosted to 24 Gy in 8 fractions of 3 Gy (30 Gy total)  . Hypertension   . Morbid obesity with body mass index of 40.0-44.9 in adult ([place]) 10/22/2011   BMI 44.26    . Osteoporosis   . Pacemaker   . Sleep apnea    STOP BANG SCORE 4  . Weakness  Patient Active Problem List   Diagnosis Date Noted  . Localized edema 07/24/2017  . Cellulitis of left lower extremity 07/24/2017  . Acute urinary retention 03/07/2017  . Metastatic cancer to spine ([place]) 03/06/2017  . Paraplegia ([place]) 03/06/2017  . Chronic pain due to neoplasm 10/09/2016  . Abdominal mass, RUQ (right upper quadrant) 09/29/2016  . Breast cancer metastasized to bone ([place]) 08/16/2012  . Breast cancer, right breast ([place]) 09/19/2011  . Mobitz type II atrioventricular block 11/09/2009    Past Surgical History:  Procedure Laterality Date  . BILATERAL KNEE ARTHROSCOPY    . BREAST SURGERY  2012,12   lumpectomy,mastectomy bil  . CHOLECYSTECTOMY    . ECTOPIC PREGNANCY SURGERY     x2  . gastric bypass surgery    . left ovary and fallopian tube  removed due to chronic pain    . mastectomy  2013   bilateral  . MULTIPLE EXTRACTIONS WITH ALVEOLOPLASTY N/A 06/04/2012   Procedure: MULTIPLE EXTRACION WITH ALVEOLOPLASTY EXTRACT: 4, 5, 8, 9, 10, 13, 14 ,28;  Surgeon: Gae Bon, DDS;  Location: [facility];  Service: Oral Surgery;  Laterality: N/A;  . PACEMAKER INSERTION  2011  . plastic surgery on face     for ptosis of rt. eyelid  . PORTACATH PLACEMENT     power port - right  . PPM  11/14/09   MDT Revo implanted by Dr Rayann Heman for syncope/ Mobitz II AV block     OB History    Gravida  1   Para      Term      Preterm      AB      Living        SAB      TAB      Ectopic      Multiple      Live Births               Home Medications    Prior to Admission medications   Medication Sig Start Date End Date Taking? Authorizing Provider  Calcium Carbonate-Vitamin D 600-400 MG-UNIT per tablet Take 1 tablet by mouth daily.  05/27/13   [provider]  cephALEXin (KEFLEX) 500 MG capsule Take 1 capsule (500 mg total) by mouth 4 (four) times daily. 07/24/17   Terald Sleeper, PA-C  clonazePAM (KLONOPIN) 1 MG tablet TAKE 1 TABLET BY MOUTH TWICE DAILY AS NEEDED FOR ANXIETY 10/15/17   Terald Sleeper, PA-C  cyclobenzaprine (FLEXERIL) 10 MG tablet TAKE 1 TABLET BY MOUTH AT BEDTIME FOR MUSCLE SPASM 11/05/17   Terald Sleeper, PA-C  fentaNYL (DURAGESIC - DOSED MCG/HR) 100 MCG/HR Place 400 mcg onto the skin every 3 (three) days. Rotate    [provider]  furosemide (LASIX) 40 MG tablet Take 1 tablet (40 mg total) by mouth daily. 04/07/17   Terald Sleeper, PA-C  lidocaine-prilocaine (EMLA) cream Apply 1 application topically as needed (prior to chemo). For port (2.5%/2/5%)    [provider]  Multiple Vitamin (THERA) TABS Take 1 tablet by mouth at bedtime.  05/27/13   [provider]  ondansetron (ZOFRAN) 8 MG tablet TAKE 1 TABLET BY MOUTH EVERY 12 HOURS AS NEEDED Patient taking differently: No sig reported  02/17/17   Terald Sleeper, PA-C  Oxycodone HCl 20 MG TABS Take 80 mg by mouth every 3 (three) hours.     [provider]  potassium chloride SA (K-DUR,KLOR-CON) 20 MEQ tablet Take 20 mEq by mouth  daily.  08/10/12   [provider]  pregabalin (LYRICA) 100 MG capsule TAKE ONE CAPSULE BY MOUTH TWICE DAILY AS NEEDED FOR PAIN 06/15/17   Terald Sleeper, PA-C  rivaroxaban (XARELTO) 20 MG TABS tablet Take 1 tablet (20 mg total) by mouth daily with supper. 10/06/17   Terald Sleeper, PA-C  tamoxifen (NOLVADEX) 20 MG tablet Take 20 mg by mouth. 09/19/16   [provider]  triamterene-hydrochlorothiazide (DYAZIDE) 37.5-25 MG capsule TAKE ONE CAPSULE BY MOUTH EVERY MORNING 11/05/17   Terald Sleeper, PA-C  zolpidem (AMBIEN) 10 MG tablet TAKE 1 TABLET BY MOUTH AT BEDTIME AS NEEDED FOR SLEEP 09/09/17   Terald Sleeper, PA-C    Family History Family History  Problem Relation Age of Onset  . Heart attack Father   . Hypertension Father   . Heart disease Father   . Breast cancer Sister   . Cancer Sister        brain  . COPD Mother   . Cancer Brother        lung  . Cancer Other        colon  . Brain cancer Sister   . Diabetes Paternal Grandmother     Social History Social History   Tobacco Use  . Smoking status: Former Smoker    Packs/day: 0.30    Years: 8.00    Pack years: 2.40    Types: Cigarettes    Last attempt to quit: 02/10/1985    Years since quitting: 32.7  . Smokeless tobacco: Never Used  . Tobacco comment: Quit 25 years back.   Substance Use Topics  . Alcohol use: No  . Drug use: No     Allergies   Penicillins; Sulfasalazine; Sulfonamide derivatives; Latex; and Tape   Review of Systems Review of Systems  Unable to perform ROS: Mental status change     Physical Exam Updated Vital Signs BP 113/82 (BP Location: Right Wrist)   Pulse 80   Temp 97.6 F (36.4 C) (Oral)   Resp 16   SpO2 100%   Physical Exam  Constitutional: She appears well-developed.    Chronically ill appearing  HENT:  Head: Normocephalic and atraumatic.  Mouth/Throat: Oropharynx is clear and moist.  Dry mucus membranes  Eyes:  R eye ptosis  Neck: Normal range of motion. Neck supple.  Cardiovascular: Normal rate, regular rhythm and normal heart sounds.  Pulmonary/Chest: Effort normal. No respiratory distress. She has no wheezes.  Abdominal: Soft. There is no tenderness. There is no rebound and no guarding.  Musculoskeletal:  Large sacral wound is depicted with foul-smelling discharge. Hard area of black eschar overlying left aspect of the wound with apparent embedded foreign body.  Foley catheter in place with sediment  Ulcerations to bilateral heels and lower legs  Neurological: She is alert.  No movement in lower extremities, equal strength in upper extremities  Skin: Skin is warm.         ED Treatments / Results  Labs (all labs ordered are listed, but only abnormal results are displayed) Labs Reviewed  CBC WITH DIFFERENTIAL/PLATELET - Abnormal; Notable for the following components:      Result Value   WBC 13.7 (*)    RBC 5.38 (*)    MCH 25.3 (*)    RDW 17.0 (*)    Neutro Abs 12.6 (*)    Lymphs Abs 0.3 (*)    All other components within normal limits  COMPREHENSIVE METABOLIC PANEL - Abnormal; Notable for the following  components:   Sodium 134 (*)    Glucose, Bld 112 (*)    Creatinine, Ser <0.30 (*)    Calcium 8.2 (*)    Albumin 2.6 (*)    All other components within normal limits  URINALYSIS, ROUTINE W REFLEX MICROSCOPIC - Abnormal; Notable for the following components:   Hgb urine dipstick LARGE (*)    Protein, ur 30 (*)    Leukocytes, UA SMALL (*)    RBC / HPF >50 (*)    Bacteria, UA RARE (*)    All other components within normal limits  RAPID URINE DRUG SCREEN, HOSP PERFORMED - Abnormal; Notable for the following components:   Opiates POSITIVE (*)    Cocaine POSITIVE (*)    All other components within normal limits  CULTURE, BLOOD  (ROUTINE X 2)  CULTURE, BLOOD (ROUTINE X 2)  URINE CULTURE  TROPONIN I  CK  PROTIME-INR  CG4 I-STAT (LACTIC ACID)  I-STAT CG4 LACTIC ACID, ED  I-STAT CG4 LACTIC ACID, ED  I-STAT CG4 LACTIC ACID, ED  I-STAT CG4 LACTIC ACID, ED    EKG EKG Interpretation  Date/Time:  Wednesday November 11 2017 05:21:53 EDT Ventricular Rate:  96 PR Interval:    QRS Duration: 97 QT Interval:  357 QTC Calculation: 452 R Axis:   68 Text Interpretation:  Sinus rhythm Prolonged PR interval RSR' in V1 or V2, probably normal variant Nonspecific T abnrm, anterolateral leads Baseline wander in lead(s) III V2 Nonspecific ST and T wave abnormality Confirmed by Ezequiel Essex [phone]) on 11/11/2017 5:31:00 AM   Radiology Ct Abdomen Pelvis Wo Contrast  Result Date: 11/11/2017 CLINICAL DATA:  Sacral decubitus ulcer. History of breast cancer metastatic to bone. EXAM: CT ABDOMEN AND PELVIS WITHOUT CONTRAST TECHNIQUE: Multidetector CT imaging of the abdomen and pelvis was performed following the standard protocol without IV contrast. COMPARISON:  PET CT 11/14/2016, enhanced abdominal CT 09/19/2016 FINDINGS: Lower chest: Pacemaker partially included. Decreased size of left lower lobe pulmonary nodule from prior exam, now with only minimal residual scarring. An additional 5 mm nodule in the left lower lobe image 11 series 4 was not definitively seen previously. Hepatobiliary: Lack of IV contrast limits assessment for focal hepatic abnormality, additionally patient scanned with arms down positioning and there is motion. Intra and extrahepatic biliary ductal dilatation is grossly similar to prior. Postcholecystectomy. Pancreas: No gross pancreatic inflammation or ductal dilatation, patient motion artifact through the pancreas significantly limits assessment. Spleen: Grossly normal, motion artifact limitations. Adrenals/Urinary Tract: No dominant adrenal nodule. Motion artifact through the kidneys without evidence of  hydronephrosis. No significant perinephric edema. Urinary bladder decompressed by Foley catheter. Stomach/Bowel: Bowel evaluation is limited in the absence of contrast, paucity of intra-abdominal fat, and patient motion. Prior gastric bypass. No small bowel dilatation. Colonic tortuosity with moderate volume of stool. Significant rectal distention with stool spanning 10.3 cm with distal rectal wall thickening. Mild perirectal edema. No convincing pneumatosis. Vascular/Lymphatic: Lack of contrast, patient motion, and paucity of intra-abdominal fat limits assessment for adenopathy, no bulky adenopathy is seen. Normal caliber abdominal aorta. Linear density extending from the distal IVC into the retroperitoneal fat is unchanged from previous. Reproductive: Status post hysterectomy. No adnexal masses. Other: No significant ascites.  No evidence of free air. Musculoskeletal: Sacral decubitus ulcer with skin thickening extending close to bone. Small foci of air in the soft tissues tracks distally along the gluteal crease. No evidence of subcutaneous abscess. No definite sacral destructive change. Persistent wall thickening appearance of the left iliac bone  consistent with metastatic disease without significant change from prior imaging. Vertebroplasty within L5. Vertebroplasty within T10 with bony sclerosis of the vertebral body. Sacral plasty also seen. Intramuscular fat density lesion within right posterior external oblique muscle, unchanged. IMPRESSION: 1 1. Fecal impaction with rectal distention, rectal wall thickening and perirectal edema. 2. Sacral decubitus ulcer with skin thickening extending close to sacral margin. No subcutaneous abscess or frank bony destructive change. 3. Again seen osseous metastatic involving left iliac bone disease, similar to prior PET. 4. Decreased size of left lower lobe pulmonary nodule. An additional small 5 mm no nodular density dule in the left lower lobe is not seen on prior exam,  unclear if this is a pulmonary nodule or atelectasis. Attention to this at follow-up recommended. Electronically Signed   By: Keith Rake M.D.   On: 11/11/2017 07:00   Ct Head Wo Contrast  Result Date: 11/11/2017 CLINICAL DATA:  57 y/o F; history of breast cancer with Mets to bones. Encephalopathy. EXAM: CT HEAD WITHOUT CONTRAST TECHNIQUE: Contiguous axial images were obtained from the base of the skull through the vertex without intravenous contrast. COMPARISON:  03/28/2014 MRI of the head. FINDINGS: Brain: No evidence of acute infarction, hemorrhage, hydrocephalus, extra-axial collection or mass lesion/mass effect. Stable chronic microvascular ischemic changes and volume loss of the brain. Vascular: No hyperdense vessel or unexpected calcification. Skull: Normal. Negative for fracture or focal lesion. Sinuses/Orbits: No acute finding. Other: None. IMPRESSION: 1. No acute intracranial abnormality identified. 2. Stable chronic microvascular ischemic changes and volume loss of the brain. Electronically Signed   By: Kristine Garbe M.D.   On: 11/11/2017 06:58   Dg Chest Port 1 View  Result Date: 11/11/2017 CLINICAL DATA:  57 y/o F; multiple pressure ulcers, being treated for sepsis. EXAM: PORTABLE CHEST 1 VIEW COMPARISON:  09/17/2016 chest radiograph FINDINGS: Stable normal cardiac silhouette. 2 lead pacemaker with stable positioning of leads. Right port catheter tip projects over mid SVC. Aortic atherosclerosis with calcification. Clear lungs. No pleural effusion or pneumothorax. No acute osseous abnormality is evident. Surgical clips project over right axilla. IMPRESSION: No active disease. Electronically Signed   By: Kristine Garbe M.D.   On: 11/11/2017 06:04    Procedures Procedures (including critical care time)  Medications Ordered in ED Medications  aztreonam (AZACTAM) 2 g in sodium chloride 0.9 % 100 mL IVPB (has no administration in time range)  metroNIDAZOLE (FLAGYL)  IVPB 500 mg (has no administration in time range)  vancomycin (VANCOCIN) IVPB 1000 mg/200 mL premix (has no administration in time range)  sodium chloride 0.9 % bolus 1,000 mL (has no administration in time range)    And  sodium chloride 0.9 % bolus 1,000 mL (has no administration in time range)     Initial Impression / Assessment and Plan / ED Course  I have reviewed the triage vital signs and the nursing notes.  Pertinent labs & imaging results that were available during my care of the patient were reviewed by me and considered in my medical decision making (see chart for details).    Paraplegic with decubitus ulcer presenting with worsening pain and drainage. Significant social issues. Recently "kicked out of hospice" due to suspected crack use.    Infected appearing ulcer and dirty foley on arrival.  Sepsis workup begun. Broad spectrum antibiotics and IVF started after cultures obtained.   Lactate normal. Port-a-cath not functioning per nursing staff.  UA appearing infected. Wound infection noted.  CT scan obtained to evaluate for abscess or  osteomyelitis.   Brother and niece confirm DNR and DNI at patient's bedside. Patient states that she wants to go home. Attempted to explain to patient that we need to get hospice set up before it is safe for her to go home.She appears septic from UTI and sacral ulcer. BP declining and additional IVF given.  CT as above without acute surgical pathology or abscess. All fentanyl patches removed except the ones that the niece applied just PTA. None were dated.   It's clear that family is unable to take care of patient at home despite patient's insistence on wanting to go home. She's unclear whether she would want surgical evaluation of her ulcer for possible debridement.   Patient would benefit from hospital admission for IVF, antibiotics and palliative care evaluation. D/w Dr. Ree Kida at bedside. Patient's goal is to go home and be comfortable but  attempted to explain that she is septic and acutely ill and her family cannot take care of her. Palliative care will be involved this morning.   CRITICAL CARE Performed by: Ezequiel Essex Total critical care time:60 minutes Critical care time was exclusive of separately billable procedures and treating other patients. Critical care was necessary to treat or prevent imminent or life-threatening deterioration. Critical care was time spent personally by me on the following activities: development of treatment plan with patient and/or surrogate as well as nursing, discussions with consultants, evaluation of patient's response to treatment, examination of patient, obtaining history from patient or surrogate, ordering and performing treatments and interventions, ordering and review of laboratory studies, ordering and review of radiographic studies, pulse oximetry and re-evaluation of patient's condition.    Final Clinical Impressions(s) / ED Diagnoses   Final diagnoses:  Sepsis with acute organ dysfunction without septic shock, due to unspecified organism, unspecified type ([place])  Pressure injury, unstageable, with infection [facility])    ED Discharge Orders    None       Ezequiel Essex, MD 11/11/17 [phone]

## 2017-11-12 LAB — URINE CULTURE: Culture: <10000 — AB

## 2017-11-16 LAB — CULTURE, BLOOD (ROUTINE X 2)
Culture: NO GROWTH
Culture: NO GROWTH
SPECIAL REQUESTS: ADEQUATE
Special Requests: ADEQUATE

## 2017-11-25 ENCOUNTER — Ambulatory Visit: Payer: Medicare Other | Admitting: Physician Assistant

## 2017-11-26 ENCOUNTER — Encounter: Payer: Self-pay | Admitting: Physician Assistant

## 2017-12-02 ENCOUNTER — Other Ambulatory Visit: Payer: Self-pay | Admitting: Physician Assistant

## 2017-12-02 DIAGNOSIS — G894 Chronic pain syndrome: Secondary | ICD-10-CM

## 2017-12-02 DIAGNOSIS — C50911 Malignant neoplasm of unspecified site of right female breast: Secondary | ICD-10-CM

## 2017-12-02 DIAGNOSIS — C7951 Secondary malignant neoplasm of bone: Principal | ICD-10-CM

## 2017-12-02 NOTE — Telephone Encounter (Signed)
Last seen 07/24/17

## 2017-12-11 DEATH — deceased

## 2018-07-22 IMAGING — CT CT CHEST W/ CM
2 of 5 series · 11 of 36 positions shown, 13 images · IV contrast (Isovue)
Comparison: CT scan of the abdomen and pelvis performed yesterday
09/18/2016 at [HOSP[facility]] [facility];

CLINICAL DATA: 56-year-old female with lumbar spine and right flank
pain for the past 2 weeks.

EXAM:
CT CHEST, ABDOMEN, AND PELVIS WITH CONTRAST
TECHNIQUE: Multidetector CT imaging of the chest, abdomen and pelvis was
performed following the standard protocol during bolus
administration of intravenous contrast.
CONTRAST:  100mL FD6FIW-AJJ IOPAMIDOL (FD6FIW-AJJ) INJECTION 61%

[Series 3: cap with · axial · 0.61mm/px · z∈[-532,-12]mm · 8 of 128 slices shown, 10 images]
[im 12/128  mediastinal]
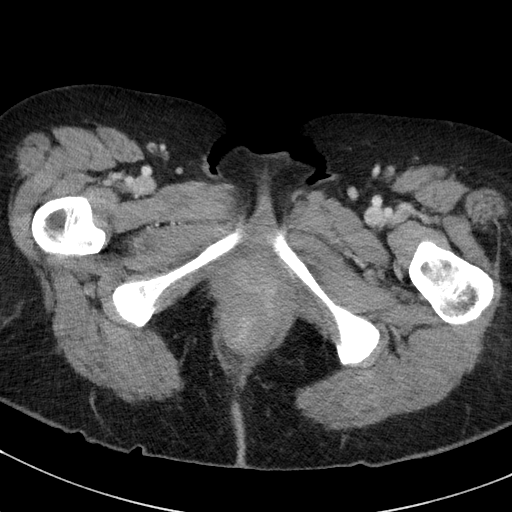
[im 12/128  lung]
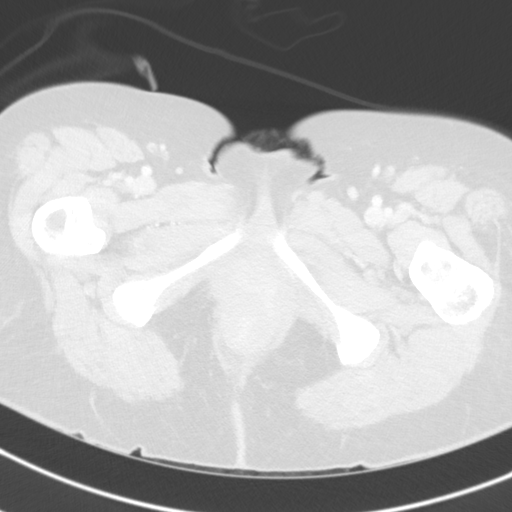
[im 24/128  lung]
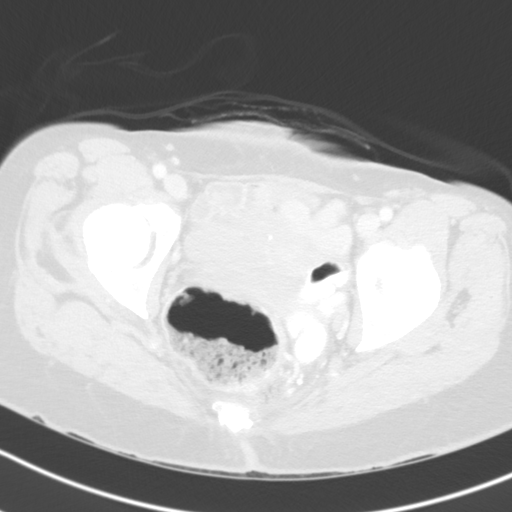
[im 47/128  lung]
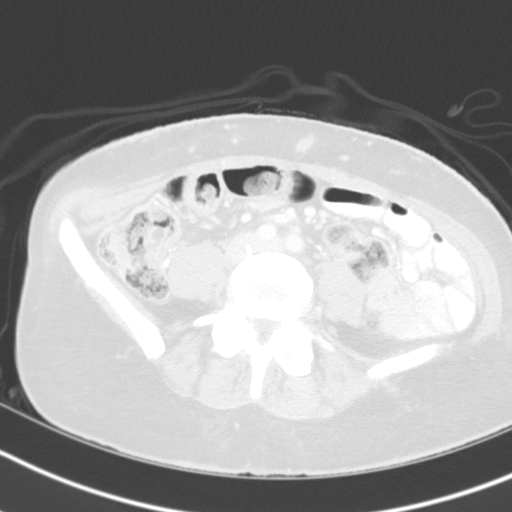
[im 58/128  lung]
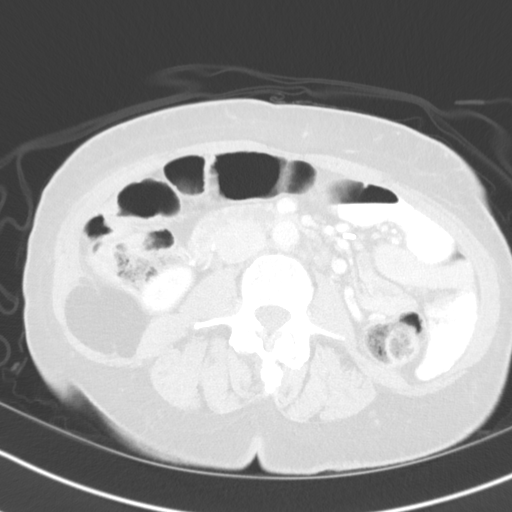
[im 70/128  mediastinal]
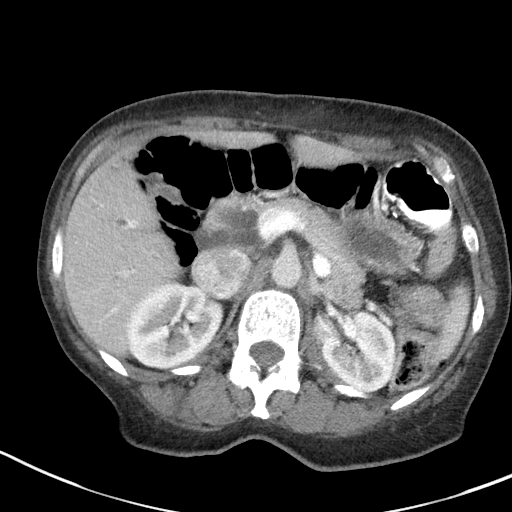
[im 70/128  lung]
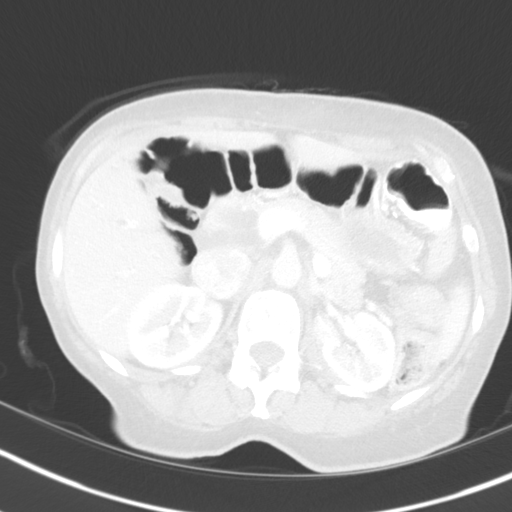
[im 81/128  lung]
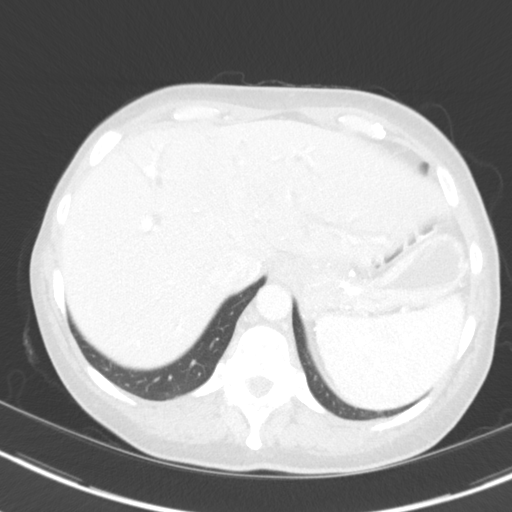
[im 104/128  lung]
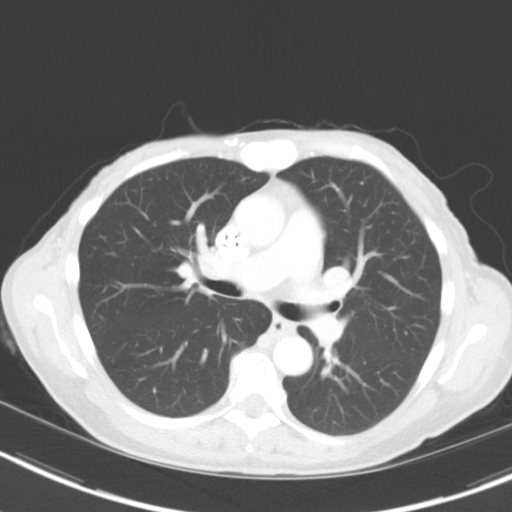
[im 116/128  lung]
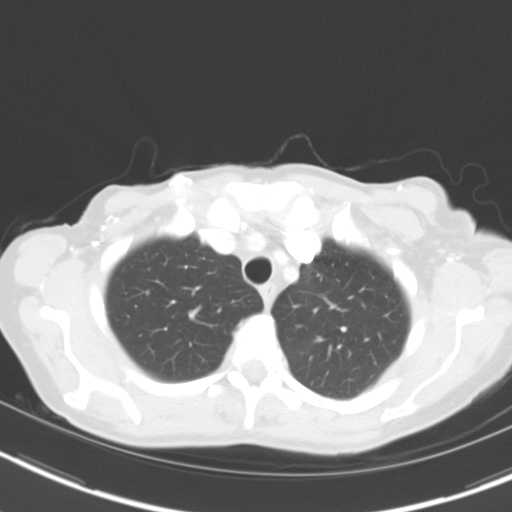

[Series 4: coronals · coronal · 0.67mm/px · 3 of 120 slices shown]
[im 24/120  lung]
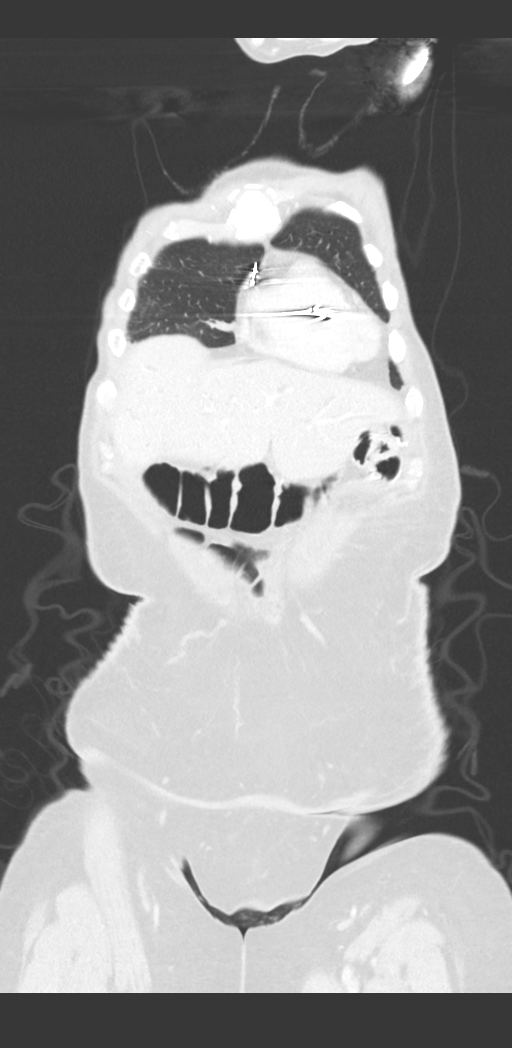
[im 48/120  lung]
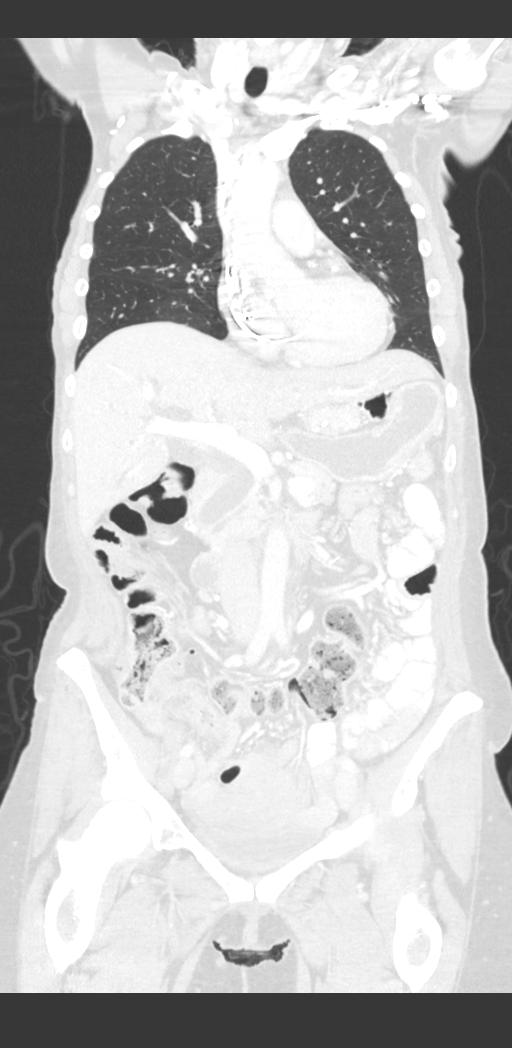
[im 72/120  lung]
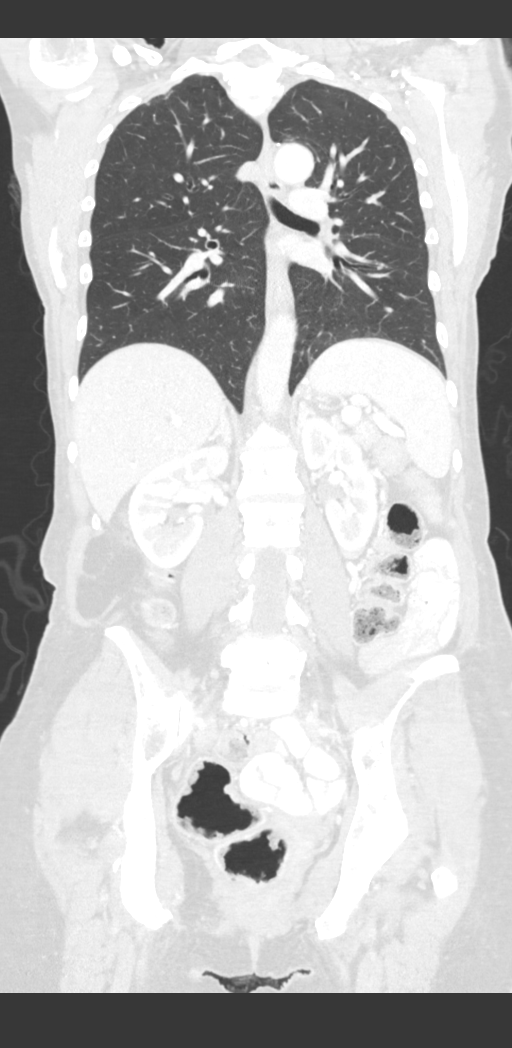

[11 of 36 positions shown; findings below may reference images not displayed]

CT scan of the abdomen and
pelvis performed 06/24/2016 and PET-CT 04/09/2016 ; CT scan of the
neck 06/07/2013
FINDINGS: CT CHEST FINDINGS

Cardiovascular: Conventional 3 vessel arch anatomy. No evidence of
aortic aneurysm or dissection. The heart is normal in size. No
pericardial effusion. Left subclavian approach cardiac rhythm
maintenance device with leads terminating in the right atrium and
right ventricular apex. There is a right subclavian port catheter
with the tip terminating at the cavoatrial junction. The main
pulmonary artery is normal in size. No central pulmonary embolus.

Mediastinum/Nodes: 2.3 cm mixed cystic and solid nodule within the
right thyroid gland remains closely unchanged dating back to Saturday May, 2013. No suspicious mediastinal mass or adenopathy.

Lungs/Pleura: Nonspecific 4 mm ground-glass attenuation nodular
opacity in the medial aspect of the right lower lobe (image 111
series 7) in has not been seen on previous imaging. Irregular
nodular opacity in the posterior aspect of the right lower lobe
measures approximately 7 mm (image 89 of series 7). This was
previously seen on a prior PET-CT but only measured 5 mm at that
time. It appears slightly more prominent on today's examination. 9
mm nodular opacity in the left lower lobe (image 98 series 7). This
lesion has also enlarged previously measuring 7 mm on the prior
PET-CT.

Musculoskeletal: Healing pathologic fracture of the sternum.
Multiple remote left-sided rib fractures including left ribs 3, 5,
6, 7. No definite new acute osseous lesion. Prior cement
augmentation of a presumed pathologic T10 compression fracture.

CT ABDOMEN PELVIS FINDINGS

Hepatobiliary: Stable intra and extrahepatic biliary ductal
dilatation. The common bile duct measures up to 12 mm at the
pancreatic head. There is mild dilatation of the pancreatic duct. No
definite distal obstructing mass or stone. No discrete hepatic
lesion is identified. The gallbladder is surgically absent.

Pancreas: Unremarkable. No pancreatic ductal dilatation or
surrounding inflammatory changes.

Spleen: Normal in size without focal abnormality.

Adrenals/Urinary Tract: Adrenal glands are unremarkable. Kidneys are
normal, without renal calculi, focal lesion, or hydronephrosis.
Bladder is unremarkable.

Stomach/Bowel: Surgical changes of prior gastric bypass surgery. No
evidence of bowel obstruction. Normal appendix in the right lower
quadrant.

Vascular/Lymphatic: No significant atherosclerotic plaque. There is
a white filling defect within the distal inferior vena cava which
appears to extend through the caval bifurcation and into the
retroperitoneal fat. This is been present dating back to at least
5950. No evidence of migration, hemorrhage or other complication.
This likely represents bone cement. No suspicious lymphadenopathy.

Reproductive: Heterogeneous uterus, similar compared to prior. No
adnexal mass.

Other: Predominantly fatty soft tissue mass in the right
posterolateral abdominal wall musculature measures 5.4 x 5.1 cm.
This appears to be centered within the external oblique musculature.
This lesion has not appreciably increased in size compared to
Sunday March, 2016 but has definitively enlarged compared to more
remote prior studies from 5950. Minimal complexity with some thin
internal septations. No abdominal wall hernia or abnormality. No
abdominopelvic ascites.

Musculoskeletal: Prior left sacro plasty, L5 cement augmentation of
a prior pathologic fracture. Advanced multilevel degenerative facet
arthropathy. Heterogeneous and moth-eaten appearance of the left
iliac wing consistent with metastatic involvement. No significant
interval progression compared to prior imaging.
IMPRESSION: CT CHEST

1. Enlarging bilateral pulmonary nodules concerning for progressive
metastatic disease.
2. Stable osseous metastatic disease without significant interval
change.
3. Healing pathologic fractures.  No new fracture identified.
4. Additional ancillary findings as above without significant
interval change.

CT ABD/PELVIS

1. Fatty, minimally complex soft tissue mass in the right
posterolateral abdominal wall musculature centered in the external
oblique muscle measures 5.4 x 5.1 cm. While this lesion demonstrates
very little interval change compared to prior imaging from earlier
this year, there has been definite enlargement compared to more
remote prior studies from 5950. While this may simply represent a
lipoma, the mild internal complexity raises the possibility of a low
grade liposarcoma. Additionally, this may correlate with the site of
the patient's reported right/lower back pain. This region would be
amenable to percutaneous biopsy if deemed clinically warranted.
2. Stable osseous metastatic disease with evidence of prior cement
augmentation in the left sacral ala and L5 vertebral body.
3. Stable intra and extrahepatic biliary ductal dilatation.
4. Additional ancillary findings as above without significant
interval change.
